# Patient Record
Sex: Male | Born: 1955 | Race: White | Hispanic: No | Marital: Married | Smoking: Former smoker
Health system: Southern US, Community
[De-identification: ages and names within clinical notes are randomized; demographics above are authoritative.]

## PROBLEM LIST (undated history)

## (undated) DIAGNOSIS — Z79620 Long term (current) use of immunosuppressive biologic: Secondary | ICD-10-CM

## (undated) DIAGNOSIS — G4733 Obstructive sleep apnea (adult) (pediatric): Secondary | ICD-10-CM

## (undated) DIAGNOSIS — E119 Type 2 diabetes mellitus without complications: Secondary | ICD-10-CM

## (undated) DIAGNOSIS — N281 Cyst of kidney, acquired: Secondary | ICD-10-CM

## (undated) DIAGNOSIS — F32A Depression, unspecified: Secondary | ICD-10-CM

## (undated) DIAGNOSIS — J45909 Unspecified asthma, uncomplicated: Secondary | ICD-10-CM

## (undated) DIAGNOSIS — M7062 Trochanteric bursitis, left hip: Secondary | ICD-10-CM

## (undated) DIAGNOSIS — I4891 Unspecified atrial fibrillation: Secondary | ICD-10-CM

## (undated) DIAGNOSIS — J449 Chronic obstructive pulmonary disease, unspecified: Secondary | ICD-10-CM

## (undated) DIAGNOSIS — F419 Anxiety disorder, unspecified: Secondary | ICD-10-CM

## (undated) DIAGNOSIS — K760 Fatty (change of) liver, not elsewhere classified: Secondary | ICD-10-CM

## (undated) DIAGNOSIS — I2510 Atherosclerotic heart disease of native coronary artery without angina pectoris: Secondary | ICD-10-CM

## (undated) DIAGNOSIS — I480 Paroxysmal atrial fibrillation: Secondary | ICD-10-CM

## (undated) DIAGNOSIS — I10 Essential (primary) hypertension: Secondary | ICD-10-CM

## (undated) DIAGNOSIS — Z7982 Long term (current) use of aspirin: Secondary | ICD-10-CM

## (undated) DIAGNOSIS — I5189 Other ill-defined heart diseases: Secondary | ICD-10-CM

## (undated) DIAGNOSIS — M810 Age-related osteoporosis without current pathological fracture: Secondary | ICD-10-CM

## (undated) DIAGNOSIS — R42 Dizziness and giddiness: Secondary | ICD-10-CM

## (undated) DIAGNOSIS — M47812 Spondylosis without myelopathy or radiculopathy, cervical region: Secondary | ICD-10-CM

## (undated) DIAGNOSIS — K449 Diaphragmatic hernia without obstruction or gangrene: Secondary | ICD-10-CM

## (undated) DIAGNOSIS — Z7952 Long term (current) use of systemic steroids: Secondary | ICD-10-CM

## (undated) DIAGNOSIS — M76891 Other specified enthesopathies of right lower limb, excluding foot: Secondary | ICD-10-CM

## (undated) DIAGNOSIS — G4730 Sleep apnea, unspecified: Secondary | ICD-10-CM

## (undated) DIAGNOSIS — M3500 Sicca syndrome, unspecified: Secondary | ICD-10-CM

## (undated) DIAGNOSIS — G3184 Mild cognitive impairment, so stated: Secondary | ICD-10-CM

## (undated) DIAGNOSIS — D649 Anemia, unspecified: Secondary | ICD-10-CM

## (undated) DIAGNOSIS — I700 Atherosclerosis of aorta: Secondary | ICD-10-CM

## (undated) DIAGNOSIS — I517 Cardiomegaly: Secondary | ICD-10-CM

## (undated) DIAGNOSIS — K219 Gastro-esophageal reflux disease without esophagitis: Secondary | ICD-10-CM

## (undated) DIAGNOSIS — J439 Emphysema, unspecified: Secondary | ICD-10-CM

## (undated) DIAGNOSIS — M1990 Unspecified osteoarthritis, unspecified site: Secondary | ICD-10-CM

## (undated) DIAGNOSIS — K635 Polyp of colon: Secondary | ICD-10-CM

## (undated) DIAGNOSIS — G96198 Other disorders of meninges, not elsewhere classified: Secondary | ICD-10-CM

## (undated) DIAGNOSIS — I471 Supraventricular tachycardia, unspecified: Secondary | ICD-10-CM

## (undated) HISTORY — PX: ESOPHAGOGASTRODUODENOSCOPY: SHX1529

## (undated) HISTORY — PX: CARDIAC CATHETERIZATION: SHX172

## (undated) HISTORY — PX: HERNIA REPAIR: SHX51

## (undated) HISTORY — PX: KNEE ARTHROSCOPY: SUR90

## (undated) HISTORY — PX: JOINT REPLACEMENT: SHX530

---

## 2015-10-12 DIAGNOSIS — I2510 Atherosclerotic heart disease of native coronary artery without angina pectoris: Secondary | ICD-10-CM

## 2015-10-12 HISTORY — DX: Atherosclerotic heart disease of native coronary artery without angina pectoris: I25.10

## 2015-10-12 HISTORY — PX: LEFT HEART CATH AND CORONARY ANGIOGRAPHY: CATH118249

## 2016-12-03 DIAGNOSIS — Z95818 Presence of other cardiac implants and grafts: Secondary | ICD-10-CM

## 2016-12-03 HISTORY — PX: LOOP RECORDER INSERTION: EP1214

## 2016-12-03 HISTORY — DX: Presence of other cardiac implants and grafts: Z95.818

## 2020-10-16 DIAGNOSIS — R7303 Prediabetes: Secondary | ICD-10-CM | POA: Insufficient documentation

## 2020-10-16 DIAGNOSIS — R42 Dizziness and giddiness: Secondary | ICD-10-CM | POA: Insufficient documentation

## 2020-10-16 DIAGNOSIS — Z86010 Personal history of colonic polyps: Secondary | ICD-10-CM | POA: Insufficient documentation

## 2020-10-16 DIAGNOSIS — F330 Major depressive disorder, recurrent, mild: Secondary | ICD-10-CM | POA: Insufficient documentation

## 2020-10-16 DIAGNOSIS — G4733 Obstructive sleep apnea (adult) (pediatric): Secondary | ICD-10-CM | POA: Insufficient documentation

## 2020-10-16 DIAGNOSIS — J431 Panlobular emphysema: Secondary | ICD-10-CM | POA: Insufficient documentation

## 2020-10-16 DIAGNOSIS — R635 Abnormal weight gain: Secondary | ICD-10-CM | POA: Insufficient documentation

## 2020-10-27 DIAGNOSIS — E785 Hyperlipidemia, unspecified: Secondary | ICD-10-CM

## 2020-10-27 HISTORY — DX: Hyperlipidemia, unspecified: E78.5

## 2021-01-11 DIAGNOSIS — N281 Cyst of kidney, acquired: Secondary | ICD-10-CM | POA: Insufficient documentation

## 2021-02-20 DIAGNOSIS — K760 Fatty (change of) liver, not elsewhere classified: Secondary | ICD-10-CM | POA: Insufficient documentation

## 2021-02-20 DIAGNOSIS — I700 Atherosclerosis of aorta: Secondary | ICD-10-CM | POA: Insufficient documentation

## 2021-03-29 DIAGNOSIS — E785 Hyperlipidemia, unspecified: Secondary | ICD-10-CM | POA: Insufficient documentation

## 2021-04-16 DIAGNOSIS — M47812 Spondylosis without myelopathy or radiculopathy, cervical region: Secondary | ICD-10-CM | POA: Insufficient documentation

## 2021-04-19 DIAGNOSIS — Z87891 Personal history of nicotine dependence: Secondary | ICD-10-CM | POA: Insufficient documentation

## 2021-04-19 DIAGNOSIS — R053 Chronic cough: Secondary | ICD-10-CM | POA: Insufficient documentation

## 2021-08-13 ENCOUNTER — Encounter: Payer: Self-pay | Admitting: Podiatry

## 2021-08-13 ENCOUNTER — Ambulatory Visit: Payer: Medicare HMO | Admitting: Podiatry

## 2021-08-13 ENCOUNTER — Other Ambulatory Visit: Payer: Self-pay

## 2021-08-13 ENCOUNTER — Encounter (INDEPENDENT_AMBULATORY_CARE_PROVIDER_SITE_OTHER): Payer: Self-pay

## 2021-08-13 DIAGNOSIS — L600 Ingrowing nail: Secondary | ICD-10-CM | POA: Diagnosis not present

## 2021-08-13 NOTE — Progress Notes (Signed)
Subjective:  Patient ID: Jesus Mckinney, male    DOB: 07/11/1956,  MRN: [id number]  Chief Complaint  Patient presents with   Nail Problem    Possible ingrown and nail discoloration     65 y.o. male presents with the above complaint.  Patient presents with complaint of right lateral border ingrown.  Patient states painful to touch.  Patient would like to have removed.  He has not seen anyone as prior to see me.  He has done self debridement which has not helped.  No infection or antibiotics he denies any other acute complaints painful with ambulation.  Pain scale is 8 out of 10.   Review of Systems: Negative except as noted in the HPI. Denies N/V/F/Ch.  History reviewed. No pertinent past medical history.  Current Outpatient Medications:    albuterol (ACCUNEB) 1.25 MG/3ML nebulizer solution, Inhale into the lungs., Disp: , Rfl:    fluticasone-salmeterol (ADVAIR HFA) 230-21 MCG/ACT inhaler, Inhale 2 puffs into the lungs 2 (two) times daily., Disp: , Rfl:    methylPREDNISolone (MEDROL DOSEPAK) 4 MG TBPK tablet, See admin instructions., Disp: , Rfl:    aspirin 81 MG EC tablet, Take by mouth., Disp: , Rfl:    atorvastatin (LIPITOR) 40 MG tablet, Take 40 mg by mouth daily., Disp: , Rfl:    escitalopram (LEXAPRO) 20 MG tablet, Take 20 mg by mouth daily., Disp: , Rfl:    gabapentin (NEURONTIN) 300 MG capsule, Take 300 mg by mouth 3 (three) times daily., Disp: , Rfl:    lamoTRIgine (LAMICTAL) 200 MG tablet, Take 200 mg by mouth daily., Disp: , Rfl:    Multiple Vitamin (MULTIVITAMIN) capsule, Take 1 capsule by mouth daily., Disp: , Rfl:    OLANZapine (ZYPREXA) 2.5 MG tablet, Take by mouth., Disp: , Rfl:    omeprazole (PRILOSEC) 40 MG capsule, Take 40 mg by mouth daily., Disp: , Rfl:    valACYclovir (VALTREX) 1000 MG tablet, Take 4,000 mg by mouth once., Disp: , Rfl:    XIIDRA 5 % SOLN, , Disp: , Rfl:   Social History   Tobacco Use  Smoking Status Not on file  Smokeless Tobacco Not on file     Allergies  Allergen Reactions   Dust Mite Extract     Other reaction(s): Cough (ALLERGY/intolerance)   Gramineae Pollens     Other reaction(s): Cough (ALLERGY/intolerance) sneezing   Other     Other reaction(s): Cough (ALLERGY/intolerance) Watery itchy eyes, sneezing.    Objective:  There were no vitals filed for this visit. There is no height or weight on file to calculate BMI. Constitutional Well developed. Well nourished.  Vascular Dorsalis pedis pulses palpable bilaterally. Posterior tibial pulses palpable bilaterally. Capillary refill normal to all digits.  No cyanosis or clubbing noted. Pedal hair growth normal.  Neurologic Normal speech. Oriented to person, place, and time. Epicritic sensation to light touch grossly present bilaterally.  Dermatologic Painful ingrowing nail at lateral nail borders of the hallux nail right. No other open wounds. No skin lesions.  Orthopedic: Normal joint ROM without pain or crepitus bilaterally. No visible deformities. No bony tenderness.   Radiographs: None Assessment:   1. Ingrown toenail of right foot    Plan:  Patient was evaluated and treated and all questions answered.  Ingrown Nail, right -Patient elects to proceed with minor surgery to remove ingrown toenail removal today. Consent reviewed and signed by patient. -Ingrown nail excised. See procedure note. -Educated on post-procedure care including soaking. Written instructions provided and  reviewed. -Patient to follow up in 2 weeks for nail check.  Procedure: Excision of Ingrown Toenail Location: Right 1st toe lateral nail borders. Anesthesia: Lidocaine 1% plain; 1.5 mL and Marcaine 0.5% plain; 1.5 mL, digital block. Skin Prep: Betadine. Dressing: Silvadene; telfa; dry, sterile, compression dressing. Technique: Following skin prep, the toe was exsanguinated and a tourniquet was secured at the base of the toe. The affected nail border was freed, split with a nail  splitter, and excised. Chemical matrixectomy was then performed with phenol and irrigated out with alcohol. The tourniquet was then removed and sterile dressing applied. Disposition: Patient tolerated procedure well. Patient to return in 2 weeks for follow-up.   No follow-ups on file.

## 2021-09-30 ENCOUNTER — Other Ambulatory Visit: Payer: Self-pay

## 2021-09-30 ENCOUNTER — Emergency Department
Admission: EM | Admit: 2021-09-30 | Discharge: 2021-09-30 | Disposition: A | Discharge status: Home / Self Care | Payer: Medicare HMO | Attending: Emergency Medicine | Admitting: Emergency Medicine

## 2021-09-30 ENCOUNTER — Emergency Department: Payer: Medicare HMO

## 2021-09-30 DIAGNOSIS — R000 Tachycardia, unspecified: Secondary | ICD-10-CM | POA: Diagnosis present

## 2021-09-30 DIAGNOSIS — I471 Supraventricular tachycardia: Secondary | ICD-10-CM | POA: Diagnosis not present

## 2021-09-30 DIAGNOSIS — Z7982 Long term (current) use of aspirin: Secondary | ICD-10-CM | POA: Insufficient documentation

## 2021-09-30 DIAGNOSIS — R079 Chest pain, unspecified: Secondary | ICD-10-CM

## 2021-09-30 LAB — CBC
HCT: 45.9 % (ref 39.0–52.0)
Hemoglobin: 15.6 g/dL (ref 13.0–17.0)
MCH: 32.9 pg (ref 26.0–34.0)
MCHC: 34 g/dL (ref 30.0–36.0)
MCV: 96.8 fL (ref 80.0–100.0)
Platelets: 316 10*3/uL (ref 150–400)
RBC: 4.74 MIL/uL (ref 4.22–5.81)
RDW: 13 % (ref 11.5–15.5)
WBC: 12.5 10*3/uL — ABNORMAL HIGH (ref 4.0–10.5)
nRBC: 0 % (ref 0.0–0.2)

## 2021-09-30 LAB — TROPONIN I (HIGH SENSITIVITY)
Troponin I (High Sensitivity): 8 ng/L (ref <18)
Troponin I (High Sensitivity): 8 ng/L (ref <18)

## 2021-09-30 LAB — BASIC METABOLIC PANEL
Anion gap: 8 (ref 5–15)
BUN: 21 mg/dL (ref 8–23)
CO2: 18 mmol/L — ABNORMAL LOW (ref 22–32)
Calcium: 8.6 mg/dL — ABNORMAL LOW (ref 8.9–10.3)
Chloride: 105 mmol/L (ref 98–111)
Creatinine, Ser: 1.33 mg/dL — ABNORMAL HIGH (ref 0.61–1.24)
GFR, Estimated: 59 mL/min — ABNORMAL LOW (ref >60)
Glucose, Bld: 84 mg/dL (ref 70–99)
Potassium: 4.1 mmol/L (ref 3.5–5.1)
Sodium: 131 mmol/L — ABNORMAL LOW (ref 135–145)

## 2021-09-30 LAB — TSH: TSH: 3.191 u[IU]/mL (ref 0.350–4.500)

## 2021-09-30 NOTE — ED Triage Notes (Addendum)
Pt presents from home via POV with complaints of chest heaviness that started about an hour prior to arrival. Pt ambulatory to triage. Pt states he consumed 3 cups of coffee around 4-5pm tonight. Respirations equal and unlabored. Denies any significant cardiac hx.

## 2021-09-30 NOTE — ED Provider Notes (Signed)
[facility] Emergency Department Provider Note  Time seen: 7:30 PM  I have reviewed the triage vital signs and the nursing notes.   HISTORY  Chief Complaint Tachycardia   HPI Jesus Mckinney is a 65 y.o. male with a past medical history of hyperlipidemia, depression, presents to the emergency department for rapid heartbeat.  According to the patient he was blowing leaves today he went inside and laid down on the couch to rest when he began feeling his heart racing.  Patient states this is happened 1 time previously he had a loop recorder for a while but it did not find any issues so they took it out.  Patient states he was feeling short of breath with the palpitations but denied any chest pain.  Patient does admit to 3 cups of coffee/caffeine this afternoon.   No past medical history on file.  Patient Active Problem List   Diagnosis Date Noted   Persistent cough 04/19/2021   Stopped smoking with greater than 30 pack year history 04/19/2021   Arthropathy of cervical facet joint 04/16/2021   Dyslipidemia, goal LDL below 70 03/29/2021   Aortic atherosclerosis (HCC) 02/20/2021   Hepatic steatosis 02/20/2021   Renal cyst 01/11/2021   History of colon polyps 10/16/2020   Mild recurrent major depression (HCC) 10/16/2020   OSA (obstructive sleep apnea) 10/16/2020   Panlobular emphysema (HCC) 10/16/2020   Prediabetes 10/16/2020   Vertigo 10/16/2020   Weight gain 10/16/2020    No past surgical history on file.  Prior to Admission medications   Medication Sig Start Date End Date Taking? Authorizing Provider  albuterol (ACCUNEB) 1.25 MG/3ML nebulizer solution Inhale into the lungs. 04/19/21 04/19/22  [provider]  aspirin 81 MG EC tablet Take by mouth.    [provider]  atorvastatin (LIPITOR) 40 MG tablet Take 40 mg by mouth daily. 06/19/21   [provider]  escitalopram (LEXAPRO) 20 MG tablet Take 20 mg by mouth daily. 06/10/21    [provider]  fluticasone-salmeterol (ADVAIR HFA) 230-21 MCG/ACT inhaler Inhale 2 puffs into the lungs 2 (two) times daily. 08/22/20   [provider]  gabapentin (NEURONTIN) 300 MG capsule Take 300 mg by mouth 3 (three) times daily. 04/15/21   [provider]  lamoTRIgine (LAMICTAL) 200 MG tablet Take 200 mg by mouth daily. 06/04/21   [provider]  methylPREDNISolone (MEDROL DOSEPAK) 4 MG TBPK tablet See admin instructions. 07/29/21   [provider]  Multiple Vitamin (MULTIVITAMIN) capsule Take 1 capsule by mouth daily.    [provider]  OLANZapine (ZYPREXA) 2.5 MG tablet Take by mouth. 06/10/21   [provider]  omeprazole (PRILOSEC) 40 MG capsule Take 40 mg by mouth daily. 06/20/21   [provider]  valACYclovir (VALTREX) 1000 MG tablet Take 4,000 mg by mouth once. 03/18/21   [provider]  Benay Spice 5 % SOLN  03/28/21   [provider]    Allergies  Allergen Reactions   Dust Mite Extract     Other reaction(s): Cough (ALLERGY/intolerance)   Gramineae Pollens     Other reaction(s): Cough (ALLERGY/intolerance) sneezing   Other     Other reaction(s): Cough (ALLERGY/intolerance) Watery itchy eyes, sneezing.     No family history on file.  Social History    Review of Systems Constitutional: Negative for fever. Cardiovascular: Negative for chest pain.  Positive for palpitations and heart racing, now resolved Respiratory: Mild shortness of breath when the heart was racing, now  resolved Gastrointestinal: Negative for abdominal pain, vomiting Musculoskeletal: Negative for musculoskeletal complaints Neurological: Negative for headache All other ROS negative  ____________________________________________   PHYSICAL EXAM:  VITAL SIGNS: ED Triage Vitals  Enc Vitals Group     BP 09/30/21 1904 (!) 140/94     Pulse Rate 09/30/21 1904 (!) 150     Resp 09/30/21 1904 18     Temp 09/30/21 1910  97.8 F (36.6 C)     Temp Source 09/30/21 1910 Oral     SpO2 09/30/21 1904 96 %     Weight 09/30/21 1904 214 lb (97.1 kg)     Height 09/30/21 1904 5\' 7"  (1.702 m)     Head Circumference --      Peak Flow --      Pain Score 09/30/21 1905 0     Pain Loc --      Pain Edu? --      Excl. in GC? --    Constitutional: Alert and oriented. Well appearing and in no distress. Eyes: Normal exam ENT      Head: Normocephalic and atraumatic.      Mouth/Throat: Mucous membranes are moist. Cardiovascular: Normal rate, regular rhythm.  Respiratory: Normal respiratory effort without tachypnea nor retractions. Breath sounds are clear  Gastrointestinal: Soft and nontender. No distention. Musculoskeletal: Nontender with normal range of motion in all extremities.  Neurologic:  Normal speech and language. No gross focal neurologic deficits  Skin:  Skin is warm, dry and intact.  Psychiatric: Mood and affect are normal.   ____________________________________________    EKG  EKG viewed and interpreted by myself shows supraventricular tachycardia at 150 bpm with a narrow QRS, normal axis, normal intervals, nonspecific but no concerning ST changes.  EKG viewed and interpreted by myself shows a sinus rhythm at 76 bpm with a narrow QRS, normal axis, largely normal intervals with no concerning ST changes.  ____________________________________________    RADIOLOGY  Chest x-ray negative  ____________________________________________   INITIAL IMPRESSION / ASSESSMENT AND PLAN / ED COURSE  Pertinent labs & imaging results that were available during my care of the patient were reviewed by me and considered in my medical decision making (see chart for details).   Patient presents to the emergency department for palpitations/heart racing sensation.  Upon arrival patient appeared to be in SVT around 150 bpm.  He was placed into a room urgently, I went to see the patient by the time I saw the patient shortly  after he was in the room he had already spontaneously converted back to a normal sinus rhythm.  Patient currently in a normal sinus rhythm around 75 bpm.  We will check labs including cardiac enzymes electrolytes and a TSH.  We will continue to closely monitor while awaiting results.  Patient agreeable to plan of care.  Lab work is largely reassuring.  TSH is normal.  Troponin normal.  Chemistry normal.  Patient continues to be in a normal sinus rhythm.  We will discharge patient home.  I have discussed with the patient how to do vagal maneuvers.  We will have the patient follow-up with cardiology.  SHIVANSH HARDAWAY was evaluated in Emergency Department on 09/30/2021 for the symptoms described in the history of present illness. He was evaluated in the context of the global COVID-19 pandemic, which necessitated consideration that the patient might be at risk for infection with the SARS-CoV-2 virus that causes COVID-19. Institutional protocols and algorithms that pertain to the evaluation of patients at risk for  COVID-19 are in a state of rapid change based on information released by regulatory bodies including the CDC and federal and state organizations. These policies and algorithms were followed during the patient's care in the ED.  ____________________________________________   FINAL CLINICAL IMPRESSION(S) / ED DIAGNOSES  SVT   Minna Antis, MD 09/30/21 2157

## 2022-02-02 ENCOUNTER — Other Ambulatory Visit: Payer: Self-pay

## 2022-02-02 ENCOUNTER — Emergency Department
Admission: EM | Admit: 2022-02-02 | Discharge: 2022-02-02 | Disposition: A | Discharge status: Home / Self Care | Payer: Medicare HMO | Attending: Emergency Medicine | Admitting: Emergency Medicine

## 2022-02-02 ENCOUNTER — Emergency Department: Payer: Medicare HMO

## 2022-02-02 DIAGNOSIS — J069 Acute upper respiratory infection, unspecified: Secondary | ICD-10-CM | POA: Insufficient documentation

## 2022-02-02 DIAGNOSIS — R059 Cough, unspecified: Secondary | ICD-10-CM | POA: Diagnosis present

## 2022-02-02 DIAGNOSIS — J4521 Mild intermittent asthma with (acute) exacerbation: Secondary | ICD-10-CM | POA: Insufficient documentation

## 2022-02-02 DIAGNOSIS — E119 Type 2 diabetes mellitus without complications: Secondary | ICD-10-CM | POA: Insufficient documentation

## 2022-02-02 DIAGNOSIS — J449 Chronic obstructive pulmonary disease, unspecified: Secondary | ICD-10-CM | POA: Diagnosis not present

## 2022-02-02 DIAGNOSIS — Z87891 Personal history of nicotine dependence: Secondary | ICD-10-CM | POA: Diagnosis not present

## 2022-02-02 HISTORY — DX: Emphysema, unspecified: J43.9

## 2022-02-02 HISTORY — DX: Chronic obstructive pulmonary disease, unspecified: J44.9

## 2022-02-02 MED ORDER — PREDNISONE 10 MG PO TABS: ORAL_TABLET | ORAL | 0 refills | Status: DC

## 2022-02-02 MED ORDER — HYDROCOD POLI-CHLORPHE POLI ER 10-8 MG/5ML PO SUER: 5.0000 mL | Freq: Two times a day (BID) | ORAL | 0 refills | Status: DC | PRN

## 2022-02-02 MED ORDER — IPRATROPIUM-ALBUTEROL 0.5-2.5 (3) MG/3ML IN SOLN
3.0000 mL | Freq: Once | RESPIRATORY_TRACT | Status: AC
  Administered 2022-02-02: 3 mL via RESPIRATORY_TRACT
  Filled 2022-02-02: qty 3

## 2022-02-02 MED ORDER — ALBUTEROL SULFATE 1.25 MG/3ML IN NEBU: 1.0000 | INHALATION_SOLUTION | RESPIRATORY_TRACT | 1 refills | Status: DC | PRN

## 2022-02-02 MED ORDER — DEXAMETHASONE SODIUM PHOSPHATE 10 MG/ML IJ SOLN
10.0000 mg | Freq: Once | INTRAMUSCULAR | Status: AC
  Administered 2022-02-02: 10 mg via INTRAMUSCULAR
  Filled 2022-02-02: qty 1

## 2022-02-02 NOTE — Discharge Instructions (Signed)
Follow-up with your primary care provider if any continued problems or concerns.  The albuterol nebulizer solution was sent to your pharmacy, prednisone as directed and also a prescription for Tussionex every 12 hours as needed for severe coughing.  Be aware that the cough medication does contain a narcotic and can cause drowsiness.  Do not drive or operate machinery while taking this medication.  Increase fluids to stay hydrated.  Return to the emergency department if any severe worsening of your symptoms such as difficulty breathing or shortness of breath. ?

## 2022-02-02 NOTE — ED Provider Notes (Signed)
? ?[facility] ?Provider Note ? ? ? Event Date/Time  ? First MD Initiated Contact with Patient 02/02/22 1102   ?  (approximate) ? ? ?History  ? ?Cough ? ? ?HPI ? ?Jesus Mckinney is a 66 y.o. male   presents to the ED with complaint of cough and congestion for the last 4 days.  He is unaware of any fever or chills.  He states that he does have seasonal allergies and also has a history of asthma.  Patient uses albuterol at home.  Patient has history of COPD and emphysema.  He was a former smoker.  He also has a history of prediabetes, obstructive sleep apnea and renal cysts. ? ?  ? ? ?Physical Exam  ? ?Triage Vital Signs: ?ED Triage Vitals  ?Enc Vitals Group  ?   BP 02/02/22 1035 (!) 154/73  ?   Pulse Rate 02/02/22 1035 69  ?   Resp 02/02/22 1035 18  ?   Temp 02/02/22 1035 98.2 ?F (36.8 ?C)  ?   Temp Source 02/02/22 1035 Oral  ?   SpO2 02/02/22 1035 95 %  ?   Weight 02/02/22 1035 220 lb (99.8 kg)  ?   Height 02/02/22 1035 5\' 5"  (1.651 m)  ?   Head Circumference --   ?   Peak Flow --   ?   Pain Score 02/02/22 1034 6  ?   Pain Loc --   ?   Pain Edu? --   ?   Excl. in GC? --   ? ? ?Most recent vital signs: ?Vitals:  ? 02/02/22 1035 02/02/22 1227  ?BP: (!) 154/73 (!) 148/70  ?Pulse: 69 74  ?Resp: 18 18  ?Temp: 98.2 ?F (36.8 ?C) 98.4 ?F (36.9 ?C)  ?SpO2: 95% 98%  ? ? ? ?General: Awake, no distress.  Appears to be uncomfortable when he has a episode of coughing which is very coarse. ?CV:  Good peripheral perfusion.  Heart regular rate and rhythm ?Resp:  Normal effort.  Lungs without expiratory wheezes, rales or rhonchi. ?Abd:  No distention.  ?Other:   ? ? ?ED Results / Procedures / Treatments  ? ?Labs ?(all labs ordered are listed, but only abnormal results are displayed) ?Labs Reviewed - No data to display ? ? ? ?RADIOLOGY ?Chest x-ray reviewed by myself independent from the radiology report is negative for infiltrate.  Radiology report is negative for any acute cardiopulmonary  disease. ? ? ? ?PROCEDURES: ? ?Critical Care performed:  ? ?Procedures ? ? ?MEDICATIONS ORDERED IN ED: ?Medications  ?dexamethasone (DECADRON) injection 10 mg (10 mg Intramuscular Given 02/02/22 1129)  ?ipratropium-albuterol (DUONEB) 0.5-2.5 (3) MG/3ML nebulizer solution 3 mL (3 mLs Nebulization Given 02/02/22 1132)  ? ? ? ?IMPRESSION / MDM / ASSESSMENT AND PLAN / ED COURSE  ?I reviewed the triage vital signs and the nursing notes. ? ? ?Differential diagnosis includes, but is not limited to, bronchitis, COPD, asthma, pneumonia. ? ? ?66 year old male presents to the ED with complaint of cough and congestion for the last 4 days.  He is unaware of any fever and denies chills.  He states that he has seasonal allergies and also has a history of asthma.  He states that generally he has an exacerbation that requires steroids and albuterol.  Chest x-ray in the ED was reassuring and patient was made aware that he did not have pneumonia.  He reported that the slow taper for the prednisone has worked well for him in  the past.  A refill of his albuterol nebulizer solution was sent to the pharmacy along with a prescription for Tussionex to take twice daily as needed for cough.  Patient was made aware that this medication contains a narcotic and because possible drowsiness which may increase his risk for falling.  Patient will follow-up with his PCP if any continued problems. ? ? ? ?  ? ? ?FINAL CLINICAL IMPRESSION(S) / ED DIAGNOSES  ? ?Final diagnoses:  ?Mild intermittent asthma with exacerbation  ?Viral URI with cough  ? ? ? ?Rx / DC Orders  ? ?ED Discharge Orders   ? ?      Ordered  ?  predniSONE (DELTASONE) 10 MG tablet       ? 02/02/22 1219  ?  albuterol (ACCUNEB) 1.25 MG/3ML nebulizer solution  Every 4 hours PRN       ? 02/02/22 1219  ?  chlorpheniramine-HYDROcodone (TUSSIONEX PENNKINETIC ER) 10-8 MG/5ML  Every 12 hours PRN       ? 02/02/22 1219  ? ?  ?  ? ?  ? ? ? ?Note:  This document was prepared using Dragon voice  recognition software and may include unintentional dictation errors. ?  ?Tommi Rumps, PA-C ?02/02/22 1352 ? ?  ?Jene Every, MD ?02/02/22 1408 ? ?

## 2022-02-02 NOTE — ED Triage Notes (Signed)
Pt c/o barking cough with congestion for the past 4 days. Pt is in NAD on arrival ?

## 2022-02-03 ENCOUNTER — Other Ambulatory Visit: Payer: Self-pay

## 2022-02-03 ENCOUNTER — Emergency Department: Payer: Medicare HMO

## 2022-02-03 ENCOUNTER — Emergency Department
Admission: EM | Admit: 2022-02-03 | Discharge: 2022-02-03 | Disposition: A | Discharge status: Home / Self Care | Payer: Medicare HMO | Attending: Emergency Medicine | Admitting: Emergency Medicine

## 2022-02-03 DIAGNOSIS — J45909 Unspecified asthma, uncomplicated: Secondary | ICD-10-CM | POA: Diagnosis not present

## 2022-02-03 DIAGNOSIS — R0602 Shortness of breath: Secondary | ICD-10-CM | POA: Diagnosis present

## 2022-02-03 DIAGNOSIS — J449 Chronic obstructive pulmonary disease, unspecified: Secondary | ICD-10-CM | POA: Diagnosis not present

## 2022-02-03 LAB — COMPREHENSIVE METABOLIC PANEL
ALT: 47 U/L — ABNORMAL HIGH (ref 0–44)
AST: 50 U/L — ABNORMAL HIGH (ref 15–41)
Albumin: 4 g/dL (ref 3.5–5.0)
Alkaline Phosphatase: 85 U/L (ref 38–126)
Anion gap: 12 (ref 5–15)
BUN: 19 mg/dL (ref 8–23)
CO2: 17 mmol/L — ABNORMAL LOW (ref 22–32)
Calcium: 8.5 mg/dL — ABNORMAL LOW (ref 8.9–10.3)
Chloride: 97 mmol/L — ABNORMAL LOW (ref 98–111)
Creatinine, Ser: 1.25 mg/dL — ABNORMAL HIGH (ref 0.61–1.24)
GFR, Estimated: >60 mL/min (ref >60)
Glucose, Bld: 139 mg/dL — ABNORMAL HIGH (ref 70–99)
Potassium: 4.8 mmol/L (ref 3.5–5.1)
Sodium: 126 mmol/L — ABNORMAL LOW (ref 135–145)
Total Bilirubin: 0.9 mg/dL (ref 0.3–1.2)
Total Protein: 7.5 g/dL (ref 6.5–8.1)

## 2022-02-03 LAB — CBC
HCT: 38.3 % — ABNORMAL LOW (ref 39.0–52.0)
Hemoglobin: 12.8 g/dL — ABNORMAL LOW (ref 13.0–17.0)
MCH: 32.6 pg (ref 26.0–34.0)
MCHC: 33.4 g/dL (ref 30.0–36.0)
MCV: 97.5 fL (ref 80.0–100.0)
Platelets: 263 10*3/uL (ref 150–400)
RBC: 3.93 MIL/uL — ABNORMAL LOW (ref 4.22–5.81)
RDW: 12.8 % (ref 11.5–15.5)
WBC: 12.7 10*3/uL — ABNORMAL HIGH (ref 4.0–10.5)
nRBC: 0 % (ref 0.0–0.2)

## 2022-02-03 LAB — TROPONIN I (HIGH SENSITIVITY): Troponin I (High Sensitivity): 14 ng/L (ref <18)

## 2022-02-03 MED ORDER — DOXYCYCLINE HYCLATE 100 MG PO CAPS: 100.0000 mg | ORAL_CAPSULE | Freq: Two times a day (BID) | ORAL | 0 refills | Status: DC

## 2022-02-03 MED ORDER — GUAIFENESIN ER 600 MG PO TB12: 600.0000 mg | ORAL_TABLET | Freq: Two times a day (BID) | ORAL | 0 refills | Status: AC

## 2022-02-03 MED ORDER — BENZONATATE 100 MG PO CAPS
200.0000 mg | ORAL_CAPSULE | Freq: Once | ORAL | Status: AC
  Administered 2022-02-03: 200 mg via ORAL
  Filled 2022-02-03: qty 2

## 2022-02-03 MED ORDER — BENZONATATE 100 MG PO CAPS: 100.0000 mg | ORAL_CAPSULE | Freq: Three times a day (TID) | ORAL | 0 refills | Status: DC | PRN

## 2022-02-03 MED ORDER — GUAIFENESIN 100 MG/5ML PO LIQD
10.0000 mL | Freq: Once | ORAL | Status: AC
  Administered 2022-02-03: 10 mL via ORAL
  Filled 2022-02-03: qty 10

## 2022-02-03 MED ORDER — IPRATROPIUM-ALBUTEROL 0.5-2.5 (3) MG/3ML IN SOLN
3.0000 mL | Freq: Once | RESPIRATORY_TRACT | Status: AC
  Administered 2022-02-03: 3 mL via RESPIRATORY_TRACT
  Filled 2022-02-03: qty 3

## 2022-02-03 NOTE — ED Provider Notes (Signed)
? ?[facility] ?Provider Note ? ? ? Event Date/Time  ? First MD Initiated Contact with Patient 02/03/22 2145   ?  (approximate) ? ? ?History  ? ?Asthma ? ? ?HPI ? ?Jesus Mckinney is a 66 y.o. male with a past history of emphysema, obesity, mental health disorder who comes to the ED today due to a asthma/COPD attack.  Reports he was getting very short of breath and wheezy, called EMS who noted that he had diffuse wheezing.  They gave IV Solu-Medrol and 2 duo nebs and symptoms have all but resolved on arrival to the ED.  He has nonproductive cough, no fever.  Denies chest pain.  States that his shortness of breath feels resolved and is back to normal, but he is worried about being at home. ? ?He believes that the symptoms are worsened because he recently moved to [place] from [place] of state and has not used to the large burden of pine pollen. ?  ? ? ?Physical Exam  ? ?Triage Vital Signs: ?ED Triage Vitals  ?Enc Vitals Group  ?   BP 02/03/22 2202 (!) 143/53  ?   Pulse Rate 02/03/22 2202 84  ?   Resp 02/03/22 2202 16  ?   Temp 02/03/22 2202 98.1 ?F (36.7 ?C)  ?   Temp Source 02/03/22 2202 Oral  ?   SpO2 02/03/22 2202 91 %  ?   Weight 02/03/22 2201 220 lb (99.8 kg)  ?   Height --   ?   Head Circumference --   ?   Peak Flow --   ?   Pain Score --   ?   Pain Loc --   ?   Pain Edu? --   ?   Excl. in GC? --   ? ? ?Most recent vital signs: ?Vitals:  ? 02/03/22 2202 02/03/22 2230  ?BP: (!) 143/53 (!) 136/54  ?Pulse: 84 86  ?Resp: 16 17  ?Temp: 98.1 ?F (36.7 ?C)   ?SpO2: 91% 94%  ? ? ? ?General: Awake, no distress.  ?CV:  Good peripheral perfusion.  Regular rate and rhythm ?Resp:  Normal effort.  Clear to auscultation, no wheezing ?Abd:  No distention.  Soft nontender ?Other:  No lower extremity edema or calf tenderness. ? ? ?ED Results / Procedures / Treatments  ? ?Labs ?(all labs ordered are listed, but only abnormal results are displayed) ?Labs Reviewed  ?CBC - Abnormal; Notable for the following  components:  ?    Result Value  ? WBC 12.7 (*)   ? RBC 3.93 (*)   ? Hemoglobin 12.8 (*)   ? HCT 38.3 (*)   ? All other components within normal limits  ?COMPREHENSIVE METABOLIC PANEL - Abnormal; Notable for the following components:  ? Sodium 126 (*)   ? Chloride 97 (*)   ? CO2 17 (*)   ? Glucose, Bld 139 (*)   ? Creatinine, Ser 1.25 (*)   ? Calcium 8.5 (*)   ? AST 50 (*)   ? ALT 47 (*)   ? All other components within normal limits  ?TROPONIN I (HIGH SENSITIVITY)  ? ? ? ?EKG ? ?Interpreted by me ?Sinus rhythm rate of 84.  Normal axis intervals QRS ST segments and T waves. ? ? ?RADIOLOGY ?Chest x-ray viewed and interpreted by me, appears normal.  Radiology report reviewed ? ? ? ?PROCEDURES: ? ?Critical Care performed: No ? ?Procedures ? ? ?MEDICATIONS ORDERED IN ED: ?Medications  ?guaiFENesin (  ROBITUSSIN) 100 MG/5ML liquid 10 mL (has no administration in time range)  ?benzonatate (TESSALON) capsule 200 mg (has no administration in time range)  ?ipratropium-albuterol (DUONEB) 0.5-2.5 (3) MG/3ML nebulizer solution 3 mL (3 mLs Nebulization Given 02/03/22 2223)  ? ? ? ?IMPRESSION / MDM / ASSESSMENT AND PLAN / ED COURSE  ?I reviewed the triage vital signs and the nursing notes. ?             ?               ? ?Differential diagnosis includes, but is not limited to, pneumonia, pneumothorax, COPD exacerbation ? ?Patient presents with episode of wheezing which is resolved on arrival to the ED with bronchodilators.  He is nontoxic, exam is reassuring, vital signs are normal.  He does appear anxious which I think is exacerbating his perception of his symptoms. ? Considering the patient's symptoms, medical history, and physical examination today, I have low suspicion for ACS, PE, TAD, pneumothorax, carditis, mediastinitis, pneumonia, CHF, or sepsis. ? ?Labs and chest x-ray in the ED are again unremarkable.  Symptoms are resolved and he is back to baseline.  I will add Tessalon, guaifenesin, doxycycline.  He does report that he  already has scheduled follow-up with pulmonology for PFTs and further evaluation. ? ? ? ?  ? ? ?FINAL CLINICAL IMPRESSION(S) / ED DIAGNOSES  ? ?Final diagnoses:  ?Chronic obstructive pulmonary disease, unspecified COPD type (HCC)  ? ? ? ?Rx / DC Orders  ? ?ED Discharge Orders   ? ?      Ordered  ?  guaiFENesin (MUCINEX) 600 MG 12 hr tablet  2 times daily       ? 02/03/22 2251  ?  benzonatate (TESSALON PERLES) 100 MG capsule  3 times daily PRN       ? 02/03/22 2251  ?  doxycycline (VIBRAMYCIN) 100 MG capsule  2 times daily       ? 02/03/22 2251  ? ?  ?  ? ?  ? ? ? ?Note:  This document was prepared using Dragon voice recognition software and may include unintentional dictation errors. ?  ?Sharman Cheek, MD ?02/03/22 2256 ? ?

## 2022-02-03 NOTE — ED Triage Notes (Signed)
Pt arrived via EMS for a Asthma attack. PT sts that the medication that we gave him yesterday and helped only a little. ?

## 2022-02-05 ENCOUNTER — Emergency Department: Payer: Medicare HMO

## 2022-02-05 ENCOUNTER — Observation Stay (HOSPITAL_COMMUNITY)
Admission: EM | Admit: 2022-02-05 | Discharge: 2022-02-06 | Disposition: A | Discharge status: Home / Self Care | Payer: Medicare HMO | Source: Home / Self Care | Attending: Emergency Medicine | Admitting: Emergency Medicine

## 2022-02-05 ENCOUNTER — Inpatient Hospital Stay: Payer: Medicare HMO

## 2022-02-05 ENCOUNTER — Inpatient Hospital Stay (HOSPITAL_COMMUNITY)
Admit: 2022-02-05 | Discharge: 2022-02-05 | Disposition: A | Discharge status: Home / Self Care | Payer: Medicare HMO | Attending: Pulmonary Disease | Admitting: Pulmonary Disease

## 2022-02-05 ENCOUNTER — Encounter: Payer: Self-pay | Admitting: Emergency Medicine

## 2022-02-05 ENCOUNTER — Other Ambulatory Visit: Payer: Self-pay

## 2022-02-05 DIAGNOSIS — Z79899 Other long term (current) drug therapy: Secondary | ICD-10-CM | POA: Insufficient documentation

## 2022-02-05 DIAGNOSIS — J690 Pneumonitis due to inhalation of food and vomit: Principal | ICD-10-CM | POA: Insufficient documentation

## 2022-02-05 DIAGNOSIS — I700 Atherosclerosis of aorta: Secondary | ICD-10-CM | POA: Diagnosis present

## 2022-02-05 DIAGNOSIS — E669 Obesity, unspecified: Secondary | ICD-10-CM | POA: Diagnosis present

## 2022-02-05 DIAGNOSIS — Z87891 Personal history of nicotine dependence: Secondary | ICD-10-CM | POA: Insufficient documentation

## 2022-02-05 DIAGNOSIS — J9600 Acute respiratory failure, unspecified whether with hypoxia or hypercapnia: Secondary | ICD-10-CM | POA: Diagnosis not present

## 2022-02-05 DIAGNOSIS — F330 Major depressive disorder, recurrent, mild: Secondary | ICD-10-CM | POA: Diagnosis present

## 2022-02-05 DIAGNOSIS — Z9660 Presence of unspecified orthopedic joint implant: Secondary | ICD-10-CM | POA: Insufficient documentation

## 2022-02-05 DIAGNOSIS — R7303 Prediabetes: Secondary | ICD-10-CM | POA: Insufficient documentation

## 2022-02-05 DIAGNOSIS — E871 Hypo-osmolality and hyponatremia: Secondary | ICD-10-CM | POA: Diagnosis present

## 2022-02-05 DIAGNOSIS — Z20822 Contact with and (suspected) exposure to covid-19: Secondary | ICD-10-CM | POA: Insufficient documentation

## 2022-02-05 DIAGNOSIS — Z7982 Long term (current) use of aspirin: Secondary | ICD-10-CM | POA: Insufficient documentation

## 2022-02-05 DIAGNOSIS — R0602 Shortness of breath: Secondary | ICD-10-CM

## 2022-02-05 DIAGNOSIS — J441 Chronic obstructive pulmonary disease with (acute) exacerbation: Secondary | ICD-10-CM | POA: Insufficient documentation

## 2022-02-05 DIAGNOSIS — I5031 Acute diastolic (congestive) heart failure: Secondary | ICD-10-CM | POA: Diagnosis not present

## 2022-02-05 DIAGNOSIS — J189 Pneumonia, unspecified organism: Secondary | ICD-10-CM | POA: Diagnosis not present

## 2022-02-05 DIAGNOSIS — R0781 Pleurodynia: Secondary | ICD-10-CM

## 2022-02-05 DIAGNOSIS — J204 Acute bronchitis due to parainfluenza virus: Secondary | ICD-10-CM

## 2022-02-05 LAB — CBC
HCT: 40.8 % (ref 39.0–52.0)
Hemoglobin: 13.7 g/dL (ref 13.0–17.0)
MCH: 33 pg (ref 26.0–34.0)
MCHC: 33.6 g/dL (ref 30.0–36.0)
MCV: 98.3 fL (ref 80.0–100.0)
Platelets: 282 10*3/uL (ref 150–400)
RBC: 4.15 MIL/uL — ABNORMAL LOW (ref 4.22–5.81)
RDW: 12.8 % (ref 11.5–15.5)
WBC: 13.9 10*3/uL — ABNORMAL HIGH (ref 4.0–10.5)
nRBC: 0 % (ref 0.0–0.2)

## 2022-02-05 LAB — RESPIRATORY PANEL BY PCR

## 2022-02-05 LAB — ECHOCARDIOGRAM COMPLETE
AR max vel: 3.39 cm2
AV Area VTI: 3.02 cm2
AV Area mean vel: 2.9 cm2
AV Mean grad: 5 mmHg
AV Peak grad: 8.4 mmHg
Ao pk vel: 1.45 m/s
Area-P 1/2: 4.26 cm2
Calc EF: 69.4 %
Height: 65 in
MV VTI: 2.63 cm2
S' Lateral: 2.95 cm
Single Plane A2C EF: 68.6 %
Single Plane A4C EF: 74.3 %
Weight: 3520 oz

## 2022-02-05 LAB — BASIC METABOLIC PANEL
Anion gap: 10 (ref 5–15)
BUN: 23 mg/dL (ref 8–23)
CO2: 20 mmol/L — ABNORMAL LOW (ref 22–32)
Calcium: 8.5 mg/dL — ABNORMAL LOW (ref 8.9–10.3)
Chloride: 95 mmol/L — ABNORMAL LOW (ref 98–111)
Creatinine, Ser: 1.23 mg/dL (ref 0.61–1.24)
GFR, Estimated: >60 mL/min (ref >60)
Glucose, Bld: 96 mg/dL (ref 70–99)
Potassium: 3.7 mmol/L (ref 3.5–5.1)
Sodium: 125 mmol/L — ABNORMAL LOW (ref 135–145)

## 2022-02-05 LAB — TROPONIN I (HIGH SENSITIVITY)
Troponin I (High Sensitivity): 12 ng/L (ref <18)
Troponin I (High Sensitivity): 13 ng/L (ref <18)

## 2022-02-05 LAB — RESP PANEL BY RT-PCR (FLU A&B, COVID) ARPGX2
Influenza A by PCR: NEGATIVE
Influenza B by PCR: NEGATIVE
SARS Coronavirus 2 by RT PCR: NEGATIVE

## 2022-02-05 LAB — LACTIC ACID, PLASMA
Lactic Acid, Venous: 3.5 mmol/L (ref 0.5–1.9)
Lactic Acid, Venous: 2.5 mmol/L (ref 0.5–1.9)

## 2022-02-05 LAB — PROCALCITONIN: Procalcitonin: <0.1 ng/mL

## 2022-02-05 LAB — BRAIN NATRIURETIC PEPTIDE: B Natriuretic Peptide: 68.1 pg/mL (ref 0.0–100.0)

## 2022-02-05 MED ORDER — OXYCODONE-ACETAMINOPHEN 5-325 MG PO TABS
1.0000 | ORAL_TABLET | Freq: Four times a day (QID) | ORAL | Status: DC | PRN
  Administered 2022-02-05 – 2022-02-06 (×3): 1 via ORAL
  Filled 2022-02-05 (×3): qty 1

## 2022-02-05 MED ORDER — KETOROLAC TROMETHAMINE 30 MG/ML IJ SOLN
10.0000 mg | Freq: Once | INTRAMUSCULAR | Status: AC
  Administered 2022-02-05: 9.9 mg via INTRAVENOUS
  Filled 2022-02-05: qty 1

## 2022-02-05 MED ORDER — IPRATROPIUM-ALBUTEROL 0.5-2.5 (3) MG/3ML IN SOLN
3.0000 mL | Freq: Once | RESPIRATORY_TRACT | Status: AC
  Administered 2022-02-05: 3 mL via RESPIRATORY_TRACT
  Filled 2022-02-05: qty 3

## 2022-02-05 MED ORDER — MOMETASONE FURO-FORMOTEROL FUM 200-5 MCG/ACT IN AERO
2.0000 | INHALATION_SPRAY | Freq: Two times a day (BID) | RESPIRATORY_TRACT | Status: DC
  Administered 2022-02-06: 2 via RESPIRATORY_TRACT

## 2022-02-05 MED ORDER — LAMOTRIGINE 25 MG PO TABS
200.0000 mg | ORAL_TABLET | Freq: Every day | ORAL | Status: DC
  Administered 2022-02-05 – 2022-02-06 (×2): 200 mg via ORAL
  Filled 2022-02-05: qty 8
  Filled 2022-02-05: qty 2

## 2022-02-05 MED ORDER — KETOROLAC TROMETHAMINE 15 MG/ML IJ SOLN
15.0000 mg | Freq: Once | INTRAMUSCULAR | Status: AC
Start: 2022-02-05 — End: 2022-02-06
  Administered 2022-02-06: 15 mg via INTRAVENOUS
  Filled 2022-02-05: qty 1

## 2022-02-05 MED ORDER — PREDNISONE 20 MG PO TABS: 40.0000 mg | ORAL_TABLET | Freq: Every day | ORAL | Status: DC

## 2022-02-05 MED ORDER — ALBUTEROL SULFATE (2.5 MG/3ML) 0.083% IN NEBU
2.5000 mg | INHALATION_SOLUTION | RESPIRATORY_TRACT | Status: DC | PRN
Start: 2022-02-05 — End: 2022-02-06

## 2022-02-05 MED ORDER — PERFLUTREN LIPID MICROSPHERE
1.0000 mL | INTRAVENOUS | Status: AC | PRN
  Administered 2022-02-05: 3 mL via INTRAVENOUS
  Filled 2022-02-05: qty 10

## 2022-02-05 MED ORDER — TRAMADOL HCL 50 MG PO TABS
100.0000 mg | ORAL_TABLET | Freq: Once | ORAL | Status: AC
  Administered 2022-02-05: 100 mg via ORAL
  Filled 2022-02-05: qty 2

## 2022-02-05 MED ORDER — SODIUM CHLORIDE 0.9% FLUSH: 3.0000 mL | INTRAVENOUS | Status: DC | PRN

## 2022-02-05 MED ORDER — GUAIFENESIN ER 600 MG PO TB12
1200.0000 mg | ORAL_TABLET | Freq: Two times a day (BID) | ORAL | Status: DC
Start: 2022-02-05 — End: 2022-02-06
  Administered 2022-02-05 – 2022-02-06 (×3): 1200 mg via ORAL
  Filled 2022-02-05 (×3): qty 2

## 2022-02-05 MED ORDER — HYDROCOD POLI-CHLORPHE POLI ER 10-8 MG/5ML PO SUER
5.0000 mL | Freq: Once | ORAL | Status: AC
  Administered 2022-02-05: 5 mL via ORAL
  Filled 2022-02-05: qty 5

## 2022-02-05 MED ORDER — PANTOPRAZOLE SODIUM 40 MG PO TBEC
40.0000 mg | DELAYED_RELEASE_TABLET | Freq: Every day | ORAL | Status: DC
Start: 2022-02-05 — End: 2022-02-06
  Administered 2022-02-05 – 2022-02-06 (×2): 40 mg via ORAL
  Filled 2022-02-05 (×2): qty 1

## 2022-02-05 MED ORDER — SODIUM CHLORIDE 0.9 % IV SOLN
3.0000 g | Freq: Three times a day (TID) | INTRAVENOUS | Status: DC
  Administered 2022-02-05 – 2022-02-06 (×3): 3 g via INTRAVENOUS
  Filled 2022-02-05 (×4): qty 8

## 2022-02-05 MED ORDER — ATENOLOL 25 MG PO TABS
25.0000 mg | ORAL_TABLET | Freq: Every day | ORAL | Status: DC
  Administered 2022-02-05 – 2022-02-06 (×2): 25 mg via ORAL
  Filled 2022-02-05 (×2): qty 1

## 2022-02-05 MED ORDER — ONDANSETRON HCL 4 MG PO TABS: 4.0000 mg | ORAL_TABLET | Freq: Four times a day (QID) | ORAL | Status: DC | PRN

## 2022-02-05 MED ORDER — ENOXAPARIN SODIUM 60 MG/0.6ML IJ SOSY
0.5000 mg/kg | PREFILLED_SYRINGE | INTRAMUSCULAR | Status: DC
  Administered 2022-02-05: 50 mg via SUBCUTANEOUS
  Filled 2022-02-05: qty 0.6

## 2022-02-05 MED ORDER — ACETAMINOPHEN 325 MG PO TABS: 650.0000 mg | ORAL_TABLET | Freq: Four times a day (QID) | ORAL | Status: DC | PRN

## 2022-02-05 MED ORDER — ESCITALOPRAM OXALATE 10 MG PO TABS
20.0000 mg | ORAL_TABLET | Freq: Every day | ORAL | Status: DC
  Administered 2022-02-05 – 2022-02-06 (×2): 20 mg via ORAL
  Filled 2022-02-05 (×2): qty 2

## 2022-02-05 MED ORDER — ASPIRIN EC 81 MG PO TBEC
81.0000 mg | DELAYED_RELEASE_TABLET | Freq: Every day | ORAL | Status: DC
  Administered 2022-02-05 – 2022-02-06 (×2): 81 mg via ORAL
  Filled 2022-02-05 (×2): qty 1

## 2022-02-05 MED ORDER — SODIUM CHLORIDE 3 % IN NEBU
4.0000 mL | INHALATION_SOLUTION | Freq: Every day | RESPIRATORY_TRACT | Status: DC
  Administered 2022-02-05 – 2022-02-06 (×2): 4 mL via RESPIRATORY_TRACT
  Filled 2022-02-05 (×2): qty 4

## 2022-02-05 MED ORDER — DONEPEZIL HCL 5 MG PO TABS
5.0000 mg | ORAL_TABLET | Freq: Every evening | ORAL | Status: DC
  Administered 2022-02-05: 5 mg via ORAL
  Filled 2022-02-05: qty 1

## 2022-02-05 MED ORDER — SODIUM CHLORIDE 0.9% FLUSH
3.0000 mL | Freq: Two times a day (BID) | INTRAVENOUS | Status: DC
  Administered 2022-02-05: 3 mL via INTRAVENOUS

## 2022-02-05 MED ORDER — ATORVASTATIN CALCIUM 20 MG PO TABS
40.0000 mg | ORAL_TABLET | Freq: Every evening | ORAL | Status: DC
  Administered 2022-02-05: 40 mg via ORAL
  Filled 2022-02-05: qty 2

## 2022-02-05 MED ORDER — IOHEXOL 350 MG/ML SOLN
75.0000 mL | Freq: Once | INTRAVENOUS | Status: AC | PRN
  Administered 2022-02-05: 75 mL via INTRAVENOUS

## 2022-02-05 MED ORDER — SODIUM CHLORIDE 0.9 % IV SOLN: 250.0000 mL | INTRAVENOUS | Status: DC | PRN

## 2022-02-05 MED ORDER — METHYLPREDNISOLONE SODIUM SUCC 40 MG IJ SOLR
40.0000 mg | Freq: Two times a day (BID) | INTRAMUSCULAR | Status: AC
  Administered 2022-02-05 – 2022-02-06 (×2): 40 mg via INTRAVENOUS
  Filled 2022-02-05 (×2): qty 1

## 2022-02-05 MED ORDER — METHYLPREDNISOLONE SODIUM SUCC 125 MG IJ SOLR
125.0000 mg | Freq: Once | INTRAMUSCULAR | Status: AC
  Administered 2022-02-05: 125 mg via INTRAVENOUS
  Filled 2022-02-05: qty 2

## 2022-02-05 MED ORDER — IPRATROPIUM-ALBUTEROL 0.5-2.5 (3) MG/3ML IN SOLN
3.0000 mL | Freq: Four times a day (QID) | RESPIRATORY_TRACT | Status: DC
  Administered 2022-02-05 – 2022-02-06 (×6): 3 mL via RESPIRATORY_TRACT
  Filled 2022-02-05 (×5): qty 3

## 2022-02-05 MED ORDER — SODIUM CHLORIDE 0.9 % IV BOLUS
500.0000 mL | Freq: Once | INTRAVENOUS | Status: AC
  Administered 2022-02-05: 500 mL via INTRAVENOUS

## 2022-02-05 MED ORDER — EZETIMIBE 10 MG PO TABS
10.0000 mg | ORAL_TABLET | Freq: Every day | ORAL | Status: DC
  Administered 2022-02-05 – 2022-02-06 (×2): 10 mg via ORAL
  Filled 2022-02-05 (×2): qty 1

## 2022-02-05 MED ORDER — SODIUM CHLORIDE 0.9 % IV SOLN
INTRAVENOUS | Status: AC
Start: 2022-02-05 — End: 2022-02-05

## 2022-02-05 MED ORDER — OLANZAPINE 2.5 MG PO TABS
1.2500 mg | ORAL_TABLET | Freq: Every day | ORAL | Status: DC
  Administered 2022-02-05 – 2022-02-06 (×2): 1.25 mg via ORAL
  Filled 2022-02-05 (×2): qty 0.5

## 2022-02-05 MED ORDER — ACETAMINOPHEN 650 MG RE SUPP: 650.0000 mg | Freq: Four times a day (QID) | RECTAL | Status: DC | PRN

## 2022-02-05 MED ORDER — SODIUM CHLORIDE 0.9 % IV SOLN
3.0000 g | Freq: Once | INTRAVENOUS | Status: AC
  Administered 2022-02-05: 3 g via INTRAVENOUS
  Filled 2022-02-05: qty 8

## 2022-02-05 MED ORDER — ONDANSETRON HCL 4 MG/2ML IJ SOLN
4.0000 mg | Freq: Four times a day (QID) | INTRAMUSCULAR | Status: DC | PRN
Start: 2022-02-05 — End: 2022-02-06

## 2022-02-05 NOTE — ED Triage Notes (Signed)
Pt to ED from home c/o cough and SOB since Saturday.  States hx COPD.  States coughing so hard right ribs hurt and feels broken.  Also states productive cough but more blood than mucous.  Using nebulizer at home and helps breathing but states still can't bring up mucous.  Pt took cough medicine with codeine around midnight.  Pt A&Ox4, chest rise even and unlabored, skin WNL. ?

## 2022-02-05 NOTE — Consult Note (Signed)
? ? ? ?PULMONOLOGY ? ? ? ? ? ? ? ? ?Date: 02/05/2022,   ?MRN# [id number] Jesus MaskerWilliam B Mckinney 11/09/1955 ? ? ?  ?AdmissionWeight: 99.8 kg                 ?CurrentWeight: 99.8 kg ? ?Referring provider: Dr Joylene IgoAgbata ? ? ?CHIEF COMPLAINT:  ? ?Severe Acute exacerbation of COPD ? ? ?HISTORY OF PRESENT ILLNESS  ? ?This is a patient with COPD OSA overlap syndrome, CAD, morbid obesity metabolic syndrome with hepatic steatosis.  He had asthma for many years and uses nebulizer at baseline. He stopped smoking 12 years ago. He notes cough and dyspnea on minimal exertion over past 6 months getting worse. He uses spiriva once daily and advair bid but was not improving and required several rounds of nebulizer.  He has recently moved from [place] and has not had pulmonary provider since moving. He reports chest pain worse on right and has been on prednisone 40mg  for past 48h. His CTPE was reviewed by me independently with findings of RLL intestitial edema and bilateral bronchitic changes.  ? ? ?PAST MEDICAL HISTORY  ? ?Past Medical History:  ?Diagnosis Date  ? COPD (chronic obstructive pulmonary disease) (HCC)   ? Emphysema lung (HCC)   ? ? ? ?SURGICAL HISTORY  ? ?Past Surgical History:  ?Procedure Laterality Date  ? JOINT REPLACEMENT    ? ? ? ?FAMILY HISTORY  ? ?History reviewed. No pertinent family history. ? ? ?SOCIAL HISTORY  ? ?Social History  ? ?Tobacco Use  ? Smoking status: Former  ?  Types: Cigarettes  ? Smokeless tobacco: Never  ?Substance Use Topics  ? Alcohol use: Not Currently  ? Drug use: Not Currently  ? ? ? ?MEDICATIONS  ? ? ?Home Medication:  ?Current Outpatient Rx  ? Order #: [id number] Class: Normal  ? Order #: [id number] Class: Historical Med  ? Order #: [id number] Class: Historical Med  ? Order #: [id number] Class: Historical Med  ? Order #: [id number] Class: Historical Med  ? Order #: [id number] Class: Print  ? Order #: [id number] Class: Normal  ? Order #: [id number] Class: Historical Med  ? Order #: [id number] Class: Print  ? Order #:  [id number] Class: Historical Med  ? Order #: [id number] Class: Historical Med  ? Order #: [id number] Class: Historical Med  ? Order #: [id number] Class: Print  ? Order #: [id number] Class: Historical Med  ? Order #: [id number] Class: Historical Med  ? Order #: [id number] Class: Historical Med  ? Order #: [id number] Class: Normal  ?  ?Current Medication: ? ?Current Facility-Administered Medications:  ?  0.9 %  sodium chloride infusion, 250 mL, Intravenous, PRN, Agbata, Tochukwu, MD ?  0.9 %  sodium chloride infusion, , Intravenous, Continuous, Agbata, Tochukwu, MD ?  acetaminophen (TYLENOL) tablet 650 mg, 650 mg, Oral, Q6H PRN **OR** acetaminophen (TYLENOL) suppository 650 mg, 650 mg, Rectal, Q6H PRN, Agbata, Tochukwu, MD ?  albuterol (PROVENTIL) (2.5 MG/3ML) 0.083% nebulizer solution 2.5 mg, 2.5 mg, Nebulization, Q2H PRN, Agbata, Tochukwu, MD ?  Ampicillin-Sulbactam (UNASYN) 3 g in sodium chloride 0.9 % 100 mL IVPB, 3 g, Intravenous, Q8H, Agbata, Tochukwu, MD ?  aspirin EC tablet 81 mg, 81 mg, Oral, Daily, Agbata, Tochukwu, MD ?  atenolol (TENORMIN) tablet 25 mg, 25 mg, Oral, Daily, Agbata, Tochukwu, MD ?  atorvastatin (LIPITOR) tablet 40 mg, 40 mg, Oral, QPM, Agbata, Tochukwu, MD ?  donepezil (ARICEPT) tablet 5 mg, 5 mg, Oral, QPM, Agbata, Tochukwu, MD ?  enoxaparin (LOVENOX) injection 50 mg, 0.5 mg/kg, Subcutaneous, Q24H, Agbata, Tochukwu, MD ?  escitalopram (  LEXAPRO) tablet 20 mg, 20 mg, Oral, Daily, Agbata, Tochukwu, MD ?  ezetimibe (ZETIA) tablet 10 mg, 10 mg, Oral, Daily, Agbata, Tochukwu, MD ?  guaiFENesin (MUCINEX) 12 hr tablet 1,200 mg, 1,200 mg, Oral, BID, Agbata, Tochukwu, MD ?  ipratropium-albuterol (DUONEB) 0.5-2.5 (3) MG/3ML nebulizer solution 3 mL, 3 mL, Nebulization, Q6H, Agbata, Tochukwu, MD ?  lamoTRIgine (LAMICTAL) tablet 200 mg, 200 mg, Oral, Daily, Agbata, Tochukwu, MD ?  methylPREDNISolone sodium succinate (SOLU-MEDROL) 40 mg/mL injection 40 mg, 40 mg, Intravenous, Q12H **FOLLOWED BY** [START ON 02/07/2022]  predniSONE (DELTASONE) tablet 40 mg, 40 mg, Oral, Q breakfast, Agbata, Tochukwu, MD ?  [START ON 02/06/2022] mometasone-formoterol (DULERA) 200-5 MCG/ACT inhaler 2 puff, 2 puff, Inhalation, BID, Agbata, Tochukwu, MD ?  OLANZapine (ZYPREXA) tablet 1.25 mg, 1.25 mg, Oral, Daily, Agbata, Tochukwu, MD ?  ondansetron (ZOFRAN) tablet 4 mg, 4 mg, Oral, Q6H PRN **OR** ondansetron (ZOFRAN) injection 4 mg, 4 mg, Intravenous, Q6H PRN, Agbata, Tochukwu, MD ?  pantoprazole (PROTONIX) EC tablet 40 mg, 40 mg, Oral, Daily, Agbata, Tochukwu, MD ?  sodium chloride flush (NS) 0.9 % injection 3 mL, 3 mL, Intravenous, Q12H, Agbata, Tochukwu, MD ?  sodium chloride flush (NS) 0.9 % injection 3 mL, 3 mL, Intravenous, PRN, Agbata, Tochukwu, MD ?  sodium chloride HYPERTONIC 3 % nebulizer solution 4 mL, 4 mL, Nebulization, Daily, Agbata, Tochukwu, MD ? ?Current Outpatient Medications:  ?  albuterol (ACCUNEB) 1.25 MG/3ML nebulizer solution, Take 3 mLs (1.25 mg total) by nebulization every 4 (four) hours as needed for wheezing or shortness of breath., Disp: 75 mL, Rfl: 1 ?  albuterol (VENTOLIN HFA) 108 (90 Base) MCG/ACT inhaler, Inhale 1-2 puffs into the lungs every 6 (six) hours as needed., Disp: , Rfl:  ?  aspirin 81 MG EC tablet, Take 81 mg by mouth daily., Disp: , Rfl:  ?  atenolol (TENORMIN) 25 MG tablet, Take 25 mg by mouth daily., Disp: , Rfl:  ?  atorvastatin (LIPITOR) 40 MG tablet, Take 40 mg by mouth daily., Disp: , Rfl:  ?  benzonatate (TESSALON PERLES) 100 MG capsule, Take 1 capsule (100 mg total) by mouth 3 (three) times daily as needed for cough., Disp: 30 capsule, Rfl: 0 ?  chlorpheniramine-HYDROcodone (TUSSIONEX PENNKINETIC ER) 10-8 MG/5ML, Take 5 mLs by mouth every 12 (twelve) hours as needed for cough., Disp: 70 mL, Rfl: 0 ?  donepezil (ARICEPT) 5 MG tablet, Take 5 mg by mouth daily., Disp: , Rfl:  ?  doxycycline (VIBRAMYCIN) 100 MG capsule, Take 1 capsule (100 mg total) by mouth 2 (two) times daily for 10 days., Disp: 20  capsule, Rfl: 0 ?  escitalopram (LEXAPRO) 20 MG tablet, Take 20 mg by mouth daily., Disp: , Rfl:  ?  ezetimibe (ZETIA) 10 MG tablet, Take 10 mg by mouth daily., Disp: , Rfl:  ?  fluticasone-salmeterol (ADVAIR HFA) 230-21 MCG/ACT inhaler, Inhale 2 puffs into the lungs 2 (two) times daily., Disp: , Rfl:  ?  guaiFENesin (MUCINEX) 600 MG 12 hr tablet, Take 1 tablet (600 mg total) by mouth 2 (two) times daily for 15 days., Disp: 30 tablet, Rfl: 0 ?  lamoTRIgine (LAMICTAL) 200 MG tablet, Take 200 mg by mouth daily., Disp: , Rfl:  ?  OLANZapine (ZYPREXA) 2.5 MG tablet, Take 1.25 mg by mouth daily., Disp: , Rfl:  ?  omeprazole (PRILOSEC) 40 MG capsule, Take 40 mg by mouth daily., Disp: , Rfl:  ?  predniSONE (DELTASONE) 10 MG tablet, Take 4 tablets once a day for  3 days, 3 tablets once a day for 3 days, 2 tablets once a day for 3 days,1 tablet once a day for 3 days, 1/2 tablet once a day for 3 days (Patient taking differently: Take 5-40 mg by mouth See admin instructions. Take 40mg  daily for 3 days, 30mg  daily for 3 days, 20mg  daily for 3 days,10mg  daily for 3 days, 5mg  daily for 3 days.), Disp: 32 tablet, Rfl: 0 ? ? ? ?ALLERGIES  ? ?Dust mite extract and Gramineae pollens ? ? ? ? ?REVIEW OF SYSTEMS  ? ? ?Review of Systems: ? ?Gen:  Denies  fever, sweats, chills weigh loss  ?HEENT: Denies blurred vision, double vision, ear pain, eye pain, hearing loss, nose bleeds, sore throat ?Cardiac:  No dizziness, chest pain or heaviness, chest tightness,edema ?Resp:   reports dyspnea chronically  ?Gi: Denies swallowing difficulty, stomach pain, nausea or vomiting, diarrhea, constipation, bowel incontinence ?Gu:  Denies bladder incontinence, burning urine ?Ext:   Denies Joint pain, stiffness or swelling ?Skin: Denies  skin rash, easy bruising or bleeding or hives ?Endoc:  Denies polyuria, polydipsia , polyphagia or weight change ?Psych:   Denies depression, insomnia or hallucinations  ? ?Other:  All other systems negative ? ? ?VS: BP (!)  162/63   Pulse 81   Temp 98.1 ?F (36.7 ?C) (Oral)   Resp (!) 23   Ht 5\' 5"  (1.651 m)   Wt 99.8 kg   SpO2 95%   BMI 36.61 kg/m?   ? ? ? ?PHYSICAL EXAM  ? ? ?GENERAL:NAD, no fevers, chills, no weakness no fatigue ?

## 2022-02-05 NOTE — Assessment & Plan Note (Signed)
Most likely related to SSRI use ?Asymptomatic ?Judicious IV fluid resuscitation ?Repeat sodium levels in a.m. ?

## 2022-02-05 NOTE — Assessment & Plan Note (Signed)
Complicates overall prognosis and care ?Lifestyle modification and exercise has been discussed with patient in detail ?

## 2022-02-05 NOTE — Progress Notes (Signed)
PHARMACY -  BRIEF ANTIBIOTIC NOTE  ? ?Pharmacy has received consult(s) for Unasyn from an ED provider.  The patient's profile has been reviewed for ht/wt/allergies/indication/available labs.   ? ?One time order(s) placed for Unasyn 3g IV by ED provider ? ?Further antibiotics/pharmacy consults should be ordered by admitting physician if indicated.       ?                ?Thank you, ?Foye Deer ?02/05/2022  7:34 AM ? ?

## 2022-02-05 NOTE — Assessment & Plan Note (Signed)
Patient presents to the ER for the third time in 1 week for evaluation of worsening shortness of breath associated with a cough and diffuse wheezing. ?Imaging is suggestive of possible aspiration ?Continue IV Unasyn initiated in the ER ?Follow-up results of blood cultures ?

## 2022-02-05 NOTE — Plan of Care (Signed)
?  Problem: Education: ?Goal: Knowledge of disease or condition will improve ?02/05/2022 2352 by Berneta Levins, RN ?Outcome: Progressing ?02/05/2022 2352 by Berneta Levins, RN ?Outcome: Progressing ?Goal: Knowledge of the prescribed therapeutic regimen will improve ?02/05/2022 2352 by Berneta Levins, RN ?Outcome: Progressing ?02/05/2022 2352 by Berneta Levins, RN ?Outcome: Progressing ?Goal: Individualized Educational Video(s) ?02/05/2022 2352 by Berneta Levins, RN ?Outcome: Progressing ?02/05/2022 2352 by Berneta Levins, RN ?Outcome: Progressing ?  ?Problem: Activity: ?Goal: Ability to tolerate increased activity will improve ?02/05/2022 2352 by Berneta Levins, RN ?Outcome: Progressing ?02/05/2022 2352 by Berneta Levins, RN ?Outcome: Progressing ?Goal: Will verbalize the importance of balancing activity with adequate rest periods ?02/05/2022 2352 by Berneta Levins, RN ?Outcome: Progressing ?02/05/2022 2352 by Berneta Levins, RN ?Outcome: Progressing ?  ?Problem: Respiratory: ?Goal: Ability to maintain a clear airway will improve ?02/05/2022 2352 by Berneta Levins, RN ?Outcome: Progressing ?02/05/2022 2352 by Berneta Levins, RN ?Outcome: Progressing ?Goal: Levels of oxygenation will improve ?02/05/2022 2352 by Berneta Levins, RN ?Outcome: Progressing ?02/05/2022 2352 by Berneta Levins, RN ?Outcome: Progressing ?Goal: Ability to maintain adequate ventilation will improve ?02/05/2022 2352 by Berneta Levins, RN ?Outcome: Progressing ?02/05/2022 2352 by Berneta Levins, RN ?Outcome: Progressing ?  ?Problem: Activity: ?Goal: Ability to tolerate increased activity will improve ?02/05/2022 2352 by Berneta Levins, RN ?Outcome: Progressing ?02/05/2022 2352 by Berneta Levins, RN ?Outcome: Progressing ?  ?Problem: Clinical Measurements: ?Goal: Ability to maintain a body temperature in the normal range will  improve ?02/05/2022 2352 by Berneta Levins, RN ?Outcome: Progressing ?02/05/2022 2352 by Berneta Levins, RN ?Outcome: Progressing ?  ?Problem: Respiratory: ?Goal: Ability to maintain adequate ventilation will improve ?02/05/2022 2352 by Berneta Levins, RN ?Outcome: Progressing ?02/05/2022 2352 by Berneta Levins, RN ?Outcome: Progressing ?Goal: Ability to maintain a clear airway will improve ?02/05/2022 2352 by Berneta Levins, RN ?Outcome: Progressing ?02/05/2022 2352 by Berneta Levins, RN ?Outcome: Progressing ?  ?

## 2022-02-05 NOTE — Assessment & Plan Note (Signed)
Stable ?Continue Lexapro and Zyprexa ?

## 2022-02-05 NOTE — Progress Notes (Signed)
Cross Cover ?Dose of toradol ordered for coughing related side pain not relieved from percocet taken around 1700 ?

## 2022-02-05 NOTE — ED Provider Notes (Signed)
? ?[facility] ?Provider Note ? ? ? Event Date/Time  ? First MD Initiated Contact with Patient 02/05/22 0525   ?  (approximate) ? ? ?History  ? ?Shortness of Breath ? ? ?HPI ? ?Jesus Mckinney is a 66 y.o. male who returns to the ED from home with a chief complaint of cough, shortness of breath and right-sided rib pain.  Patient has a history of COPD not on home oxygen who recently moved to [place] from [place].  States the pollen has exacerbated his COPD symptoms.  He was seen in the ED for same on 4/9 and 02/03/2022.  At the last visit he was discharged on Mucinex, Tessalon Perles and doxycycline.  Reports he is doing nebulizer treatments every 1.5 hours in addition to his inhalers and cough medicines without relief of symptoms.  Endorses now hematemesis.  Denies fever, chills, abdominal pain, nausea, vomiting or dizziness. ?  ? ? ?Past Medical History  ? ?Past Medical History:  ?Diagnosis Date  ? COPD (chronic obstructive pulmonary disease) (HCC)   ? Emphysema lung (HCC)   ? ? ? ?Active Problem List  ? ?Patient Active Problem List  ? Diagnosis Date Noted  ? Pneumonia 02/05/2022  ? Persistent cough 04/19/2021  ? Stopped smoking with greater than 30 pack year history 04/19/2021  ? Arthropathy of cervical facet joint 04/16/2021  ? Dyslipidemia, goal LDL below 70 03/29/2021  ? Aortic atherosclerosis (HCC) 02/20/2021  ? Hepatic steatosis 02/20/2021  ? Renal cyst 01/11/2021  ? History of colon polyps 10/16/2020  ? 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 OSA (obstructive sleep apnea) 10/16/2020  ? Panlobular emphysema (HCC) 10/16/2020  ? Prediabetes 10/16/2020  ? Vertigo 10/16/2020  ? Weight gain 10/16/2020  ? ? ? ?Past Surgical History  ? ?Past Surgical History:  ?Procedure Laterality Date  ? JOINT REPLACEMENT    ? ? ? ?Home Medications  ? ?Prior to Admission medications   ?Medication Sig Start Date End Date Taking? Authorizing Provider  ?albuterol (ACCUNEB) 1.25 MG/3ML  nebulizer solution Take 3 mLs (1.25 mg total) by nebulization every 4 (four) hours as needed for wheezing or shortness of breath. 02/02/22 02/02/23  Tommi Rumps, PA-C  ?albuterol (VENTOLIN HFA) 108 (90 Base) MCG/ACT inhaler Inhale 1-2 puffs into the lungs every 6 (six) hours as needed. 08/13/21   [provider]  ?aspirin 81 MG EC tablet Take 81 mg by mouth daily.    [provider]  ?atenolol (TENORMIN) 25 MG tablet Take 25 mg by mouth daily. 01/30/22   [provider]  ?atorvastatin (LIPITOR) 40 MG tablet Take 40 mg by mouth daily. 06/19/21   [provider]  ?benzonatate (TESSALON PERLES) 100 MG capsule Take 1 capsule (100 mg total) by mouth 3 (three) times daily as needed for cough. 02/03/22 02/03/23  Sharman Cheek, MD  ?chlorpheniramine-HYDROcodone [facility]) 10-8 MG/5ML Take 5 mLs by mouth every 12 (twelve) hours as needed for cough. 02/02/22   Tommi Rumps, PA-C  ?donepezil (ARICEPT) 5 MG tablet Take 5 mg by mouth daily. 01/27/22   [provider]  ?doxycycline (VIBRAMYCIN) 100 MG capsule Take 1 capsule (100 mg total) by mouth 2 (two) times daily for 10 days. 02/03/22 02/13/22  Sharman Cheek, MD  ?escitalopram (LEXAPRO) 20 MG tablet Take 20 mg by mouth daily. 06/10/21   [provider]  ?ezetimibe (ZETIA) 10 MG tablet Take 10 mg by mouth daily. 01/11/22   [provider]  ?fluticasone-salmeterol (  ADVAIR HFA) 230-21 MCG/ACT inhaler Inhale 2 puffs into the lungs 2 (two) times daily. 08/22/20   [provider]  ?gabapentin (NEURONTIN) 300 MG capsule Take 300 mg by mouth 3 (three) times daily. ?Patient not taking: Reported on 09/30/2021 04/15/21   [provider]  ?guaiFENesin (MUCINEX) 600 MG 12 hr tablet Take 1 tablet (600 mg total) by mouth 2 (two) times daily for 15 days. 02/03/22 02/18/22  Sharman CheekStafford, Phillip, MD  ?lamoTRIgine (LAMICTAL) 200 MG tablet Take 200 mg by mouth daily. 06/04/21   [provider]   ?methylPREDNISolone (MEDROL DOSEPAK) 4 MG TBPK tablet See admin instructions. ?Patient not taking: Reported on 09/30/2021 07/29/21   [provider]  ?Multiple Vitamin (MULTIVITAMIN) capsule Take 1 capsule by mouth daily. ?Patient not taking: Reported on 09/30/2021    [provider]  ?OLANZapine (ZYPREXA) 2.5 MG tablet Take 1.25 mg by mouth daily. 06/10/21   [provider]  ?omeprazole (PRILOSEC) 40 MG capsule Take 40 mg by mouth daily. 06/20/21   [provider]  ?predniSONE (DELTASONE) 10 MG tablet Take 4 tablets once a day for 3 days, 3 tablets once a day for 3 days, 2 tablets once a day for 3 days,1 tablet once a day for 3 days, 1/2 tablet once a day for 3 days 02/02/22   Tommi RumpsSummers, Rhonda L, PA-C  ?valACYclovir (VALTREX) 1000 MG tablet Take 4,000 mg by mouth once. ?Patient not taking: Reported on 09/30/2021 03/18/21   [provider]  ?Benay SpiceXIIDRA 5 % SOLN  03/28/21   [provider]  ? ? ? ?Allergies  ?Dust mite extract, Gramineae pollens, and Other ? ? ?Family History  ?History reviewed. No pertinent family history. ? ? ?Physical Exam  ?Triage Vital Signs: ?ED Triage Vitals [02/05/22 0516]  ?Enc Vitals Group  ?   BP (!) 159/59  ?   Pulse Rate 75  ?   Resp 19  ?   Temp 98.1 ?F (36.7 ?C)  ?   Temp Source Oral  ?   SpO2 97 %  ?   Weight 220 lb (99.8 kg)  ?   Height 5\' 5"  (1.651 m)  ?   Head Circumference   ?   Peak Flow   ?   Pain Score 6  ?   Pain Loc   ?   Pain Edu?   ?   Excl. in GC?   ? ? ?Updated Vital Signs: ?BP (!) 162/63   Pulse 81   Temp 98.1 ?F (36.7 ?C) (Oral)   Resp (!) 23   Ht 5\' 5"  (1.651 m)   Wt 99.8 kg   SpO2 95%   BMI 36.61 kg/m?  ? ? ?General: Awake, mild distress.  ?CV:  RRR.  Good peripheral perfusion.  Right lateral chest wall pain.  Mild splinting.  No crepitus. ?Resp:  Normal effort.  Diminished aeration. ?Abd:  Nontender.  No distention.  ?Other:  Bilateral calves are nontender and nonswollen. ? ? ?ED Results / Procedures / Treatments   ?Labs ?(all labs ordered are listed, but only abnormal results are displayed) ?Labs Reviewed  ?BASIC METABOLIC PANEL - Abnormal; Notable for the following components:  ?    Result Value  ? Sodium 125 (*)   ? Chloride 95 (*)   ? CO2 20 (*)   ? Calcium 8.5 (*)   ? All other components within normal limits  ?CBC - Abnormal; Notable for the following components:  ? WBC 13.9 (*)   ? RBC  4.15 (*)   ? All other components within normal limits  ?RESP PANEL BY RT-PCR (FLU A&B, COVID) [id number]  ?CULTURE, BLOOD (ROUTINE X 2)  ?CULTURE, BLOOD (ROUTINE X 2)  ?LACTIC ACID, PLASMA  ?LACTIC ACID, PLASMA  ?TROPONIN I (HIGH SENSITIVITY)  ?TROPONIN I (HIGH SENSITIVITY)  ? ? ? ?EKG ? ?ED ECG REPORT ?I, Irean Hong, the attending physician, personally viewed and interpreted this ECG. ? ? Date: 02/05/2022 ? EKG Time: 0514 ? Rate: 77 ? Rhythm: normal sinus rhythm ? Axis: Normal ? Intervals: QTc 497 ? ST&T Change: Nonspecific ? ? ? ?RADIOLOGY ?I have independently visualized and interpreted patient's chest x-ray, CTA chest as well as noted the radiology interpretation: ? ?Chest x-ray: Low lung volumes, otherwise unremarkable ? ?CTA chest: No PE, possible sequelae of mild aspiration; left lower lobe nodular density ? ?Official radiology report(s): ?DG Chest 2 View ? ?Result Date: 02/05/2022 ?CLINICAL DATA:  66 year old male with history of cough and shortness of breath. EXAM: CHEST - 2 VIEW COMPARISON:  Chest x-ray 02/03/2022. FINDINGS: Lung volumes are low. No consolidative airspace disease. No pleural effusions. No pneumothorax. No pulmonary nodule or mass noted. Pulmonary vasculature and the cardiomediastinal silhouette are within normal limits. IMPRESSION: 1. Low lung volumes without radiographic evidence of acute cardiopulmonary disease. Electronically Signed   By: Trudie Reed M.D.   On: 02/05/2022 05:57  ? ?CT Angio Chest PE W/Cm &/Or Wo Cm ? ?Result Date: 02/05/2022 ?CLINICAL DATA:  66 year old male with history of cough and  shortness of breath since Saturday. Evaluate for pulmonary embolism. EXAM: CT ANGIOGRAPHY CHEST WITH CONTRAST TECHNIQUE: Multidetector CT imaging of the chest was performed using the standard protocol during bolus administration

## 2022-02-05 NOTE — Progress Notes (Signed)
*  PRELIMINARY RESULTS* ?Echocardiogram ?2D Echocardiogram has been performed. ? ?Jesus Mckinney ?02/05/2022, 2:31 PM ?

## 2022-02-05 NOTE — Assessment & Plan Note (Signed)
Stable ?Continue aspirin, atorvastatin and atenolol ?

## 2022-02-05 NOTE — TOC Initial Note (Signed)
Transition of Care (TOC) - Initial/Assessment Note  ? ? ?Patient Details  ?Name: Jesus Mckinney ?MRN: [id number] ?Date of Birth: 24-Jul-1956 ? ?Transition of Care (TOC) CM/SW Contact:    ?Marlowe Sax, RN ?Phone Number: ?02/05/2022, 3:12 PM ? ?Clinical Narrative:                 ?Patient has a history of COPD not on home oxygen who recently moved to [place] from .  ? ?Has been in the ED for the same symptoms 3 times in the last week,  ? ? He has used his nebulizer at home with transient improvement. ?  ?He is listed to go to [facility] for PCP and gets meds from [facility] pharmacy ?TOC to monitor for needs and assist with DC planning ? ?Patient Goals and CMS Choice ?  ?  ?  ?Expected Discharge Plan and Services ?  ?  ?  ?  ?  ?                ?  ?  ?  ?  ?  ?  ?  ?  ?  ?  ? ?Prior Living Arrangements/Services ?  ?  ?  ?       ?  ?  ?  ?  ? ?Activities of Daily Living ?  ?  ? ?Permission Sought/Granted ?  ?  ?   ?   ?   ?   ? ?Emotional Assessment ?  ?  ?  ?  ?  ?  ? ?Admission diagnosis:  Shortness of breath [R06.02] ?Hyponatremia [E87.1] ?Pneumonia [J18.9] ?COPD exacerbation (HCC) [J44.1] ?Rib pain on right side [R07.81] ?Aspiration pneumonia, unspecified aspiration pneumonia type, unspecified laterality, unspecified part of lung (HCC) [J69.0] ?Patient Active Problem List  ? Diagnosis Date Noted  ? Pneumonia 02/05/2022  ? COPD with acute exacerbation (HCC) 02/05/2022  ? Obesity (BMI 30-39.9) 02/05/2022  ? Hyponatremia 02/05/2022  ? Persistent cough 04/19/2021  ? Stopped smoking with greater than 30 pack year history 04/19/2021  ? Arthropathy of cervical facet joint 04/16/2021  ? Dyslipidemia, goal LDL below 70 03/29/2021  ? Aortic atherosclerosis (HCC) 02/20/2021  ? Hepatic steatosis 02/20/2021  ? Renal cyst 01/11/2021  ? History of colon polyps 10/16/2020  ? 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 OSA (obstructive sleep apnea) 10/16/2020  ? Panlobular emphysema  (HCC) 10/16/2020  ? Prediabetes 10/16/2020  ? Vertigo 10/16/2020  ? Weight gain 10/16/2020  ? ?PCP:  [facility] ?Pharmacy:   ?[facility] [id number] [place], [place] - [address] ?[address] ?[place] [address] ?Phone: [phone] Fax: [phone] ? ?[id number] [place], [place] - [address] ?[address] ?[place] [address] ?Phone: [phone] Fax: [phone] ? ? ? ? ?Social Determinants of Health (SDOH) Interventions ?  ? ?Readmission Risk Interventions ?   ? View : No data to display.  ?  ?  ?  ? ? ? ?

## 2022-02-05 NOTE — ED Notes (Signed)
Informed RN bed assigned 

## 2022-02-05 NOTE — Plan of Care (Signed)
?  Problem: Education: ?Goal: Knowledge of disease or condition will improve ?Outcome: Progressing ?Goal: Knowledge of the prescribed therapeutic regimen will improve ?Outcome: Progressing ?Goal: Individualized Educational Video(s) ?Outcome: Progressing ?  ?Problem: Activity: ?Goal: Ability to tolerate increased activity will improve ?Outcome: Progressing ?Goal: Will verbalize the importance of balancing activity with adequate rest periods ?Outcome: Progressing ?  ?Problem: Respiratory: ?Goal: Ability to maintain a clear airway will improve ?Outcome: Progressing ?Goal: Levels of oxygenation will improve ?Outcome: Progressing ?Goal: Ability to maintain adequate ventilation will improve ?Outcome: Progressing ?  ?Problem: Activity: ?Goal: Ability to tolerate increased activity will improve ?Outcome: Progressing ?  ?Problem: Clinical Measurements: ?Goal: Ability to maintain a body temperature in the normal range will improve ?Outcome: Progressing ?  ?Problem: Respiratory: ?Goal: Ability to maintain adequate ventilation will improve ?Outcome: Progressing ?Goal: Ability to maintain a clear airway will improve ?Outcome: Progressing ?  ?

## 2022-02-05 NOTE — Assessment & Plan Note (Signed)
Patient with an underlying history of COPD who presents for evaluation of worsening shortness of breath associated with a cough, hemoptysis and diffuse wheezing. ?Place patient on scheduled and as needed bronchodilator therapy ?Place patient on systemic steroids ?We will start patient on antitussive ?

## 2022-02-05 NOTE — H&P (Signed)
?History and Physical  ? ? ?Patient: Jesus Mckinney [id number] DOB: 02-14-1956 ?DOA: 02/05/2022 ?DOS: the patient was seen and examined on 02/05/2022 ?PCP: [facility], [facility]  ?Patient coming from: Home ? ?Chief Complaint:  ?Chief Complaint  ?Patient presents with  ? Shortness of Breath  ? ?HPI: Jesus Mckinney is a 66 y.o. male with medical history significant for COPD, obesity, depression who presents to the emergency room for the third time in 1 week for evaluation of worsening shortness of breath and a cough. ?Patient has had symptoms for about 4 days and states that it is progressively getting worse.  He has difficulty expectorating phlegm but has had episodes of hemoptysis.  He complains of severe pain in his right lateral chest wall and thinks he may have broken a rib or pulled a muscle from persistent cough.  He denies having any fever or chills.  He was prescribed systemic steroids, antibiotics and antitussives without any significant improvement in his symptoms.  He has used his nebulizer at home with transient improvement. ?He denies having any nausea, no vomiting, no dizziness, no headache, no lightheadedness, no urinary symptoms, no abdominal pain, no leg swelling, no blurred vision no focal deficit. ? ?Review of Systems: As mentioned in the history of present illness. All other systems reviewed and are negative. ?Past Medical History:  ?Diagnosis Date  ? COPD (chronic obstructive pulmonary disease) ([place])   ? Emphysema lung ([place])   ? ?Past Surgical History:  ?Procedure Laterality Date  ? JOINT REPLACEMENT    ? ?Social History:  reports that he has quit smoking. His smoking use included cigarettes. He has never used smokeless tobacco. He reports that he does not currently use alcohol. He reports that he does not currently use drugs. ? ?Allergies  ?Allergen Reactions  ? Dust Mite Extract Cough  ? Gramineae Pollens Cough  ? ? ?History reviewed. No pertinent family history. ? ?Prior to  Admission medications   ?Medication Sig Start Date End Date Taking? Authorizing Provider  ?albuterol (ACCUNEB) 1.25 MG/3ML nebulizer solution Take 3 mLs (1.25 mg total) by nebulization every 4 (four) hours as needed for wheezing or shortness of breath. 02/02/22 02/02/23 Yes Summers, Rhonda L, PA-C  ?albuterol (VENTOLIN HFA) 108 (90 Base) MCG/ACT inhaler Inhale 1-2 puffs into the lungs every 6 (six) hours as needed. 08/13/21  Yes [provider]  ?aspirin 81 MG EC tablet Take 81 mg by mouth daily.   Yes [provider]  ?atenolol (TENORMIN) 25 MG tablet Take 25 mg by mouth daily. 01/30/22  Yes [provider]  ?atorvastatin (LIPITOR) 40 MG tablet Take 40 mg by mouth daily. 06/19/21  Yes [provider]  ?benzonatate (TESSALON PERLES) 100 MG capsule Take 1 capsule (100 mg total) by mouth 3 (three) times daily as needed for cough. 02/03/22 02/03/23 Yes Carrie Mew, MD  ?chlorpheniramine-HYDROcodone [facility]) 10-8 MG/5ML Take 5 mLs by mouth every 12 (twelve) hours as needed for cough. 02/02/22  Yes Letitia Neri L, PA-C  ?donepezil (ARICEPT) 5 MG tablet Take 5 mg by mouth daily. 01/27/22  Yes [provider]  ?escitalopram (LEXAPRO) 20 MG tablet Take 20 mg by mouth daily. 06/10/21  Yes [provider]  ?ezetimibe (ZETIA) 10 MG tablet Take 10 mg by mouth daily. 01/11/22  Yes [provider]  ?fluticasone-salmeterol (ADVAIR HFA) 230-21 MCG/ACT inhaler Inhale 2 puffs into the lungs 2 (two) times daily. 08/22/20  Yes [provider]  ?guaiFENesin ([place]) 600 MG 12  hr tablet Take 1 tablet (600 mg total) by mouth 2 (two) times daily for 15 days. 02/03/22 02/18/22 Yes Carrie Mew, MD  ?lamoTRIgine (LAMICTAL) 200 MG tablet Take 200 mg by mouth daily. 06/04/21  Yes [provider]  ?OLANZapine (ZYPREXA) 2.5 MG tablet Take 1.25 mg by mouth daily. 06/10/21  Yes [provider]  ?omeprazole (PRILOSEC) 40 MG capsule Take 40 mg by  mouth daily. 06/20/21  Yes [provider]  ?predniSONE (DELTASONE) 10 MG tablet Take 4 tablets once a day for 3 days, 3 tablets once a day for 3 days, 2 tablets once a day for 3 days,1 tablet once a day for 3 days, 1/2 tablet once a day for 3 days ?Patient taking differently: Take 5-40 mg by mouth See admin instructions. Take 40mg  daily for 3 days, 30mg  daily for 3 days, 20mg  daily for 3 days,10mg  daily for 3 days, 5mg  daily for 3 days. 02/02/22  Yes [name], PA-C  ?doxycycline (VIBRAMYCIN) 100 MG capsule Take 1 capsule (100 mg total) by mouth 2 (two) times daily for 10 days. 02/03/22 02/13/22  Carrie Mew, MD  ? ? ?Physical Exam: ?Vitals:  ? 02/05/22 0516 02/05/22 0639  ?BP: (!) 159/59 (!) 162/63  ?Pulse: 75 81  ?Resp: 19 (!) 23  ?Temp: 98.1 ?F (36.7 ?C)   ?TempSrc: Oral   ?SpO2: 97% 95%  ?Weight: 99.8 kg   ?Height: 5\' 5"  (1.651 m)   ? ?Physical Exam ?Vitals and nursing note reviewed.  ?Constitutional:   ?   Appearance: He is obese.  ?HENT:  ?   Head: Normocephalic and atraumatic.  ?   Mouth/Throat:  ?   Mouth: Mucous membranes are moist.  ?Eyes:  ?   Pupils: Pupils are equal, round, and reactive to light.  ?Cardiovascular:  ?   Rate and Rhythm: Normal rate and regular rhythm.  ?Pulmonary:  ?   Effort: Tachypnea present.  ?   Breath sounds: Examination of the right-upper field reveals wheezing. Examination of the left-upper field reveals wheezing. Examination of the right-middle field reveals wheezing. Examination of the left-middle field reveals wheezing. Examination of the right-lower field reveals wheezing. Examination of the left-lower field reveals wheezing. Wheezing present.  ?Chest:  ?   Chest wall: Tenderness present.  ?   Comments: Right lateral chest wall ?Abdominal:  ?   General: Bowel sounds are normal.  ?   Palpations: Abdomen is soft.  ?Musculoskeletal:     ?   General: Normal range of motion.  ?   Cervical back: Normal range of motion and neck supple.  ?Skin: ?   General: Skin is  warm and dry.  ?Neurological:  ?   General: No focal deficit present.  ?   Mental Status: He is alert.  ?Psychiatric:     ?   Mood and Affect: Mood normal.     ?   Behavior: Behavior normal.  ? ? ?Data Reviewed: ?Relevant notes from primary care and specialist visits, past discharge summaries as available in EHR, including Care Everywhere. ?Prior diagnostic testing as pertinent to current admission diagnoses ?Updated medications and problem lists for reconciliation ?ED course, including vitals, labs, imaging, treatment and response to treatment ?Triage notes, nursing and pharmacy notes and ED provider's notes ?Notable results as noted in HPI ?Labs reviewed.  Lactic acid 3.5, troponin 13, sodium 125, potassium 3.7, chloride 95, bicarb 20, glucose 96, BUN 23, creatinine 1.23, calcium 8.5, white count 13.9, hemoglobin 13.7, hematocrit 40.8, MCV 98.3, RDW  12.8, platelet count 282 ?Respiratory viral panel is negative ?Chest x-ray reviewed by me shows Low lung volumes without radiographic evidence of acute ?cardiopulmonary disease. ?CT angiogram of the chest shows what appears to be passive subsegmental atelectasis in ?the lower lobes of the lungs bilaterally, favored to predominantly reflect areas of passive atelectasis, although sequela of mild aspiration could have a similar appearance. ? In the left lower lobe (axial image 104 of series 5) there is a 1.6 x 0.9 cm nodular density. In the acute setting, this could be of infectious or inflammatory etiology, however, underlying neoplasm is not excluded. Follow-up noncontrast CT in 3 months is recommended to ensure resolution of this finding.  ?Aortic atherosclerosis, in addition to left main and three-vessel coronary artery disease. Mild cardiomegaly. ?Twelve-lead EKG reviewed by me shows sinus rhythm with an incomplete right bundle branch block. ?There are no new results to review at this time. ? ?Assessment and Plan: ?* Pneumonia ?Patient presents to the ER for the  third time in 1 week for evaluation of worsening shortness of breath associated with a cough and diffuse wheezing. ?Imaging is suggestive of possible aspiration ?Continue IV Unasyn initiated in the ER ?Follow-up r

## 2022-02-06 DIAGNOSIS — J204 Acute bronchitis due to parainfluenza virus: Secondary | ICD-10-CM

## 2022-02-06 DIAGNOSIS — J189 Pneumonia, unspecified organism: Secondary | ICD-10-CM | POA: Diagnosis not present

## 2022-02-06 DIAGNOSIS — J441 Chronic obstructive pulmonary disease with (acute) exacerbation: Secondary | ICD-10-CM | POA: Diagnosis not present

## 2022-02-06 LAB — CBC
HCT: 38.2 % — ABNORMAL LOW (ref 39.0–52.0)
Hemoglobin: 12.7 g/dL — ABNORMAL LOW (ref 13.0–17.0)
MCH: 32.1 pg (ref 26.0–34.0)
MCHC: 33.2 g/dL (ref 30.0–36.0)
MCV: 96.5 fL (ref 80.0–100.0)
Platelets: 239 K/uL (ref 150–400)
RBC: 3.96 MIL/uL — ABNORMAL LOW (ref 4.22–5.81)
RDW: 12.9 % (ref 11.5–15.5)
WBC: 10.1 K/uL (ref 4.0–10.5)
nRBC: 0 % (ref 0.0–0.2)

## 2022-02-06 LAB — BASIC METABOLIC PANEL WITH GFR
Anion gap: 6 (ref 5–15)
BUN: 19 mg/dL (ref 8–23)
CO2: 20 mmol/L — ABNORMAL LOW (ref 22–32)
Calcium: 8 mg/dL — ABNORMAL LOW (ref 8.9–10.3)
Chloride: 105 mmol/L (ref 98–111)
Creatinine, Ser: 1.16 mg/dL (ref 0.61–1.24)
GFR, Estimated: >60 mL/min
Glucose, Bld: 132 mg/dL — ABNORMAL HIGH (ref 70–99)
Potassium: 3.8 mmol/L (ref 3.5–5.1)
Sodium: 131 mmol/L — ABNORMAL LOW (ref 135–145)

## 2022-02-06 LAB — C-REACTIVE PROTEIN: CRP: 0.5 mg/dL (ref <1.0)

## 2022-02-06 LAB — HIV ANTIBODY (ROUTINE TESTING W REFLEX): HIV Screen 4th Generation wRfx: NONREACTIVE

## 2022-02-06 LAB — PROCALCITONIN: Procalcitonin: <0.1 ng/mL

## 2022-02-06 MED ORDER — IPRATROPIUM-ALBUTEROL 0.5-2.5 (3) MG/3ML IN SOLN
3.0000 mL | Freq: Four times a day (QID) | RESPIRATORY_TRACT | 1 refills | Status: DC | PRN
Start: 2022-02-06 — End: 2023-09-29

## 2022-02-06 MED ORDER — PREDNISONE 20 MG PO TABS: 40.0000 mg | ORAL_TABLET | Freq: Every day | ORAL | 0 refills | Status: AC

## 2022-02-06 MED ORDER — AMOXICILLIN-POT CLAVULANATE 875-125 MG PO TABS: 1.0000 | ORAL_TABLET | Freq: Two times a day (BID) | ORAL | 0 refills | Status: AC

## 2022-02-06 MED ORDER — IBUPROFEN 200 MG PO TABS: 400.0000 mg | ORAL_TABLET | Freq: Three times a day (TID) | ORAL | Status: DC | PRN

## 2022-02-06 MED ORDER — ALUM & MAG HYDROXIDE-SIMETH 200-200-20 MG/5ML PO SUSP
30.0000 mL | Freq: Once | ORAL | Status: AC
  Administered 2022-02-06: 30 mL via ORAL
  Filled 2022-02-06: qty 30

## 2022-02-06 MED ORDER — KETOROLAC TROMETHAMINE 15 MG/ML IJ SOLN
15.0000 mg | Freq: Four times a day (QID) | INTRAMUSCULAR | Status: DC
  Administered 2022-02-06: 15 mg via INTRAVENOUS
  Filled 2022-02-06: qty 1

## 2022-02-06 NOTE — Care Management Obs Status (Signed)
MEDICARE OBSERVATION STATUS NOTIFICATION ? ? ?Patient Details  ?Name: Jesus Mckinney ?MRN: [id number] ?Date of Birth: 06/24/1956 ? ? ?Medicare Observation Status Notification Given:    ? ? ? ?Marlowe Sax, RN ?02/06/2022, 1:02 PM ?

## 2022-02-06 NOTE — Discharge Summary (Signed)
?Physician Discharge Summary ?  ?Patient: Jesus Mckinney MRN: [id number] DOB: 1956/10/27  ?Admit date:     02/05/2022  ?Discharge date: 02/06/22  ?Discharge Physician: Lewie Chamber  ? ?PCP: [facility], [facility]  ? ?Recommendations at discharge:  ? ?Needs repeat CT chest in approximately 3 months for left lower lobe nodular density evaluation ?Follow-up with pulmonology ? ?Discharge Diagnoses: ?Principal Problem: ?  Pneumonia ?Active Problems: ?  COPD with acute exacerbation (HCC) ?  Aortic atherosclerosis (HCC) ?  Mild recurrent major depression (HCC) ?  Obesity (BMI 30-39.9) ?  Hyponatremia ?  COPD exacerbation (HCC) ? ?Resolved Problems: ?  * No resolved hospital problems. * ? ?Hospital Course: ?[name] is a 66 year old male with PMH COPD, obesity, depression who presented with worsening shortness of breath and cough.  Symptoms have been going on for approximately 4 to 5 days prior to admission with no significant improvement.  He was having thickened sputum and developing pain in his right chest wall from severe coughing at home. ?CXR on admission showed no acute findings.  CTA chest was then obtained which showed subsegmental atelectasis in the lower lobes bilaterally.  Also noted to have a left lower lobe nodular density with recommendation for outpatient repeat CT in 3 months. ?Dedicated CXR to the right sided ribs also was negative for any rib fractures.  His pain complaints were considered musculoskeletal from significant coughing at home prior to admission.  He did respond well to Toradol in the hospital and was recommended to continue on OTC ibuprofen short term at home. ?He was also evaluated by pulmonology during hospitalization. ?RVP was positive for parainfluenza virus.  He had also been started empirically on Augmentin in case of some possible aspiration prior to hospitalization.  He was continued on Augmentin at discharge especially in setting of his underlying COPD.  Short steroid course  also prescribed as well as nebulizers per patient request. ?He will follow-up outpatient with pulmonology for probable repeat CT. ? ? ?  ? ? ?Consultants: Pulmonology  ?Procedures performed:   ?Disposition: Home ?Diet recommendation:  ?Discharge Diet Orders (From admission, onward)  ? ?  Start     Ordered  ? 02/06/22 0000  Diet - low sodium heart healthy       ? 02/06/22 1245  ? 02/06/22 0000  Diet - low sodium heart healthy       ? 02/06/22 1245  ? ?  ?  ? ?  ? ?Cardiac diet ?DISCHARGE MEDICATION: ?Allergies as of 02/06/2022   ? ?   Reactions  ? Dust Mite Extract Cough  ? Gramineae Pollens Cough  ? ?  ? ?  ?Medication List  ?  ? ?STOP taking these medications   ? ?doxycycline 100 MG capsule ?Commonly known as: VIBRAMYCIN ?  ? ?  ? ?TAKE these medications   ? ?Advair HFA 230-21 MCG/ACT inhaler ?Generic drug: fluticasone-salmeterol ?Inhale 2 puffs into the lungs 2 (two) times daily. ?  ?albuterol 108 (90 Base) MCG/ACT inhaler ?Commonly known as: VENTOLIN HFA ?Inhale 1-2 puffs into the lungs every 6 (six) hours as needed. ?  ?albuterol 1.25 MG/3ML nebulizer solution ?Commonly known as: ACCUNEB ?Take 3 mLs (1.25 mg total) by nebulization every 4 (four) hours as needed for wheezing or shortness of breath. ?  ?amoxicillin-clavulanate 875-125 MG tablet ?Commonly known as: Augmentin ?Take 1 tablet by mouth 2 (two) times daily for 6 days. ?  ?aspirin 81 MG EC tablet ?Take 81 mg by mouth daily. ?  ?  atenolol 25 MG tablet ?Commonly known as: TENORMIN ?Take 25 mg by mouth daily. ?  ?atorvastatin 40 MG tablet ?Commonly known as: LIPITOR ?Take 40 mg by mouth daily. ?  ?benzonatate 100 MG capsule ?Commonly known as: LawyerTessalon Perles ?Take 1 capsule (100 mg total) by mouth 3 (three) times daily as needed for cough. ?  ?chlorpheniramine-HYDROcodone 10-8 MG/5ML ?Commonly known as: Tussionex Pennkinetic ER ?Take 5 mLs by mouth every 12 (twelve) hours as needed for cough. ?  ?donepezil 5 MG tablet ?Commonly known as: ARICEPT ?Take 5 mg  by mouth daily. ?  ?escitalopram 20 MG tablet ?Commonly known as: LEXAPRO ?Take 20 mg by mouth daily. ?  ?ezetimibe 10 MG tablet ?Commonly known as: ZETIA ?Take 10 mg by mouth daily. ?  ?guaiFENesin 600 MG 12 hr tablet ?Commonly known as: Mucinex ?Take 1 tablet (600 mg total) by mouth 2 (two) times daily for 15 days. ?  ?ibuprofen 200 MG tablet ?Commonly known as: ADVIL ?Take 2 tablets (400 mg total) by mouth every 8 (eight) hours as needed for mild pain or moderate pain. ?  ?ipratropium-albuterol 0.5-2.5 (3) MG/3ML Soln ?Commonly known as: DUONEB ?Take 3 mLs by nebulization every 6 (six) hours as needed. ?  ?lamoTRIgine 200 MG tablet ?Commonly known as: LAMICTAL ?Take 200 mg by mouth daily. ?  ?OLANZapine 2.5 MG tablet ?Commonly known as: ZYPREXA ?Take 1.25 mg by mouth daily. ?  ?omeprazole 40 MG capsule ?Commonly known as: PRILOSEC ?Take 40 mg by mouth daily. ?  ?predniSONE 20 MG tablet ?Commonly known as: DELTASONE ?Take 2 tablets (40 mg total) by mouth daily with breakfast for 5 days. ?Start taking on: February 07, 2022 ?What changed:  ?medication strength ?how much to take ?how to take this ?when to take this ?additional instructions ?  ? ?  ? ? Follow-up Information   ? ? Vida RiggerAleskerov, Fuad, MD. Schedule an appointment as soon as possible for a visit in 1 month(s).   ?Specialty: Pulmonary Disease ?Contact information: ?[address] ?[place] [id number] ?[phone] ? ? ?  ?  ? ?  ?  ? ?  ? ?Discharge Exam: ?Filed Weights  ? 02/05/22 0516  ?Weight: 99.8 kg  ? ?Physical Exam ?Constitutional:   ?   General: He is not in acute distress. ?   Appearance: Normal appearance. He is well-developed.  ?HENT:  ?   Head: Normocephalic and atraumatic.  ?   Mouth/Throat:  ?   Mouth: Mucous membranes are moist.  ?Eyes:  ?   Extraocular Movements: Extraocular movements intact.  ?Cardiovascular:  ?   Rate and Rhythm: Normal rate and regular rhythm.  ?   Heart sounds: Normal heart sounds.  ?Pulmonary:  ?   Effort: Pulmonary  effort is normal. No respiratory distress.  ?   Breath sounds: No wheezing.  ?   Comments: Coarse breath sounds bilaterally ?Abdominal:  ?   General: Bowel sounds are normal. There is no distension.  ?   Palpations: Abdomen is soft.  ?   Tenderness: There is no abdominal tenderness.  ?Musculoskeletal:     ?   General: Normal range of motion.  ?   Cervical back: Normal range of motion and neck supple.  ?Skin: ?   General: Skin is warm and dry.  ?Neurological:  ?   General: No focal deficit present.  ?   Mental Status: He is alert.  ?Psychiatric:     ?   Mood and Affect: Mood normal.     ?  Behavior: Behavior normal.  ? ? ? ?Condition at discharge: stable ? ?The results of significant diagnostics from this hospitalization (including imaging, microbiology, ancillary and laboratory) are listed below for reference.  ? ?Imaging Studies: ?DG Chest 2 View ? ?Result Date: 02/05/2022 ?CLINICAL DATA:  66 year old male with history of cough and shortness of breath. EXAM: CHEST - 2 VIEW COMPARISON:  Chest x-ray 02/03/2022. FINDINGS: Lung volumes are low. No consolidative airspace disease. No pleural effusions. No pneumothorax. No pulmonary nodule or mass noted. Pulmonary vasculature and the cardiomediastinal silhouette are within normal limits. IMPRESSION: 1. Low lung volumes without radiographic evidence of acute cardiopulmonary disease. Electronically Signed   By: Trudie Reed M.D.   On: 02/05/2022 05:57  ? ?DG Chest 2 View ? ?Result Date: 02/02/2022 ?CLINICAL DATA:  Cough and congestion for 4 days.  History of COPD. EXAM: CHEST - 2 VIEW COMPARISON:  09/30/2021. FINDINGS: Cardiac silhouette normal in size and configuration. Normal mediastinal and hilar contours. Mild reticular opacity at the left lung base consistent with scarring, stable. Lungs otherwise clear. No pleural effusion or pneumothorax. Skeletal structures are grossly intact. IMPRESSION: No active cardiopulmonary disease. Electronically Signed   By: Amie Portland  M.D.   On: 02/02/2022 11:08  ? ?DG Ribs Unilateral Right ? ?Result Date: 02/05/2022 ?CLINICAL DATA:  Right rib pain EXAM: RIGHT RIBS - 2 VIEW COMPARISON:  None. FINDINGS: No fracture or other bone lesi

## 2022-02-06 NOTE — Care Management CC44 (Signed)
Condition Code 44 Documentation Completed ? ?Patient Details  ?Name: Jesus Mckinney ?MRN: [id number] ?Date of Birth: [birth year] ? ? ?Condition Code 44 given:    ?Patient signature on Condition Code 44 notice:    ?Documentation of 2 MD's agreement:    ?Code 44 added to claim:    ? ? ? ?Conception Oms, RN ?02/06/2022, 1:02 PM ? ?

## 2022-02-06 NOTE — Care Management Obs Status (Signed)
MEDICARE OBSERVATION STATUS NOTIFICATION ? ? ?Patient Details  ?Name: Jesus Mckinney ?MRN: [id number] ?Date of Birth: October 18, 1956 ? ? ?Medicare Observation Status Notification Given:    ? ?yes ? ?Marlowe Sax, RN ?02/06/2022, 12:57 PM ?

## 2022-02-06 NOTE — Hospital Course (Signed)
Mr. Harrison is a 66 year old male with PMH COPD, obesity, depression who presented with worsening shortness of breath and cough.  Symptoms have been going on for approximately 4 to 5 days prior to admission with no significant improvement.  He was having thickened sputum and developing pain in his right chest wall from severe coughing at home. ?CXR on admission showed no acute findings.  CTA chest was then obtained which showed subsegmental atelectasis in the lower lobes bilaterally.  Also noted to have a left lower lobe nodular density with recommendation for outpatient repeat CT in 3 months. ?Dedicated CXR to the right sided ribs also was negative for any rib fractures.  His pain complaints were considered musculoskeletal from significant coughing at home prior to admission.  He did respond well to Toradol in the hospital and was recommended to continue on OTC ibuprofen short term at home. ?He was also evaluated by pulmonology during hospitalization. ?RVP was positive for parainfluenza virus.  He had also been started empirically on Augmentin in case of some possible aspiration prior to hospitalization.  He was continued on Augmentin at discharge especially in setting of his underlying COPD.  Short steroid course also prescribed as well as nebulizers per patient request. ?He will follow-up outpatient with pulmonology for probable repeat CT. ?

## 2022-02-06 NOTE — Care Management CC44 (Signed)
Condition Code 44 Documentation Completed ? ?Patient Details  ?Name: Jesus Mckinney ?MRN: [id number] ?Date of Birth: 08/06/1956 ? ? ?Condition Code 44 given:  Yes ?Patient signature on Condition Code 44 notice:  Yes ?Documentation of 2 MD's agreement:  Yes ?Code 44 added to claim:  Yes ? ? ? ?Conception Oms, RN ?02/06/2022, 1:03 PM ? ?

## 2022-02-06 NOTE — Plan of Care (Signed)
?  Problem: Education: ?Goal: Knowledge of disease or condition will improve ?Outcome: Adequate for Discharge ?Goal: Knowledge of the prescribed therapeutic regimen will improve ?Outcome: Adequate for Discharge ?Goal: Individualized Educational Video(s) ?Outcome: Adequate for Discharge ?  ?Problem: Activity: ?Goal: Ability to tolerate increased activity will improve ?Outcome: Adequate for Discharge ?Goal: Will verbalize the importance of balancing activity with adequate rest periods ?Outcome: Adequate for Discharge ?  ?Problem: Respiratory: ?Goal: Ability to maintain a clear airway will improve ?Outcome: Adequate for Discharge ?Goal: Levels of oxygenation will improve ?Outcome: Adequate for Discharge ?Goal: Ability to maintain adequate ventilation will improve ?Outcome: Adequate for Discharge ?  ?Problem: Activity: ?Goal: Ability to tolerate increased activity will improve ?Outcome: Adequate for Discharge ?  ?Problem: Clinical Measurements: ?Goal: Ability to maintain a body temperature in the normal range will improve ?Outcome: Adequate for Discharge ?  ?Problem: Respiratory: ?Goal: Ability to maintain adequate ventilation will improve ?Outcome: Adequate for Discharge ?Goal: Ability to maintain a clear airway will improve ?Outcome: Adequate for Discharge ?  ?

## 2022-02-06 NOTE — Care Management Obs Status (Signed)
MEDICARE OBSERVATION STATUS NOTIFICATION ? ? ?Patient Details  ?Name: Draedyn B Brinker ?MRN: [id number] ?Date of Birth: 03/22/1956 ? ? ?Medicare Observation Status Notification Given:    ? ? ? ?Hannahgrace Lalli J Savreen Gebhardt, RN ?02/06/2022, 1:02 PM ?

## 2022-02-07 ENCOUNTER — Encounter: Payer: Self-pay | Admitting: Emergency Medicine

## 2022-02-07 ENCOUNTER — Inpatient Hospital Stay
Admission: EM | Admit: 2022-02-07 | Discharge: 2022-02-09 | DRG: 190 | Disposition: A | Discharge status: Home / Self Care | Payer: Medicare HMO | Attending: Internal Medicine | Admitting: Internal Medicine

## 2022-02-07 ENCOUNTER — Other Ambulatory Visit: Payer: Self-pay

## 2022-02-07 ENCOUNTER — Emergency Department: Payer: Medicare HMO

## 2022-02-07 DIAGNOSIS — K920 Hematemesis: Secondary | ICD-10-CM | POA: Diagnosis present

## 2022-02-07 DIAGNOSIS — Z7982 Long term (current) use of aspirin: Secondary | ICD-10-CM

## 2022-02-07 DIAGNOSIS — Z87891 Personal history of nicotine dependence: Secondary | ICD-10-CM

## 2022-02-07 DIAGNOSIS — E785 Hyperlipidemia, unspecified: Secondary | ICD-10-CM | POA: Diagnosis present

## 2022-02-07 DIAGNOSIS — F330 Major depressive disorder, recurrent, mild: Secondary | ICD-10-CM | POA: Diagnosis not present

## 2022-02-07 DIAGNOSIS — J9600 Acute respiratory failure, unspecified whether with hypoxia or hypercapnia: Secondary | ICD-10-CM | POA: Diagnosis present

## 2022-02-07 DIAGNOSIS — Z20822 Contact with and (suspected) exposure to covid-19: Secondary | ICD-10-CM | POA: Diagnosis present

## 2022-02-07 DIAGNOSIS — K760 Fatty (change of) liver, not elsewhere classified: Secondary | ICD-10-CM | POA: Diagnosis present

## 2022-02-07 DIAGNOSIS — E871 Hypo-osmolality and hyponatremia: Secondary | ICD-10-CM | POA: Diagnosis present

## 2022-02-07 DIAGNOSIS — R0902 Hypoxemia: Secondary | ICD-10-CM

## 2022-02-07 DIAGNOSIS — R7303 Prediabetes: Secondary | ICD-10-CM | POA: Diagnosis present

## 2022-02-07 DIAGNOSIS — J9601 Acute respiratory failure with hypoxia: Secondary | ICD-10-CM | POA: Diagnosis not present

## 2022-02-07 DIAGNOSIS — Z888 Allergy status to other drugs, medicaments and biological substances status: Secondary | ICD-10-CM

## 2022-02-07 DIAGNOSIS — G4733 Obstructive sleep apnea (adult) (pediatric): Secondary | ICD-10-CM | POA: Diagnosis present

## 2022-02-07 DIAGNOSIS — J204 Acute bronchitis due to parainfluenza virus: Secondary | ICD-10-CM | POA: Diagnosis present

## 2022-02-07 DIAGNOSIS — J690 Pneumonitis due to inhalation of food and vomit: Secondary | ICD-10-CM | POA: Diagnosis present

## 2022-02-07 DIAGNOSIS — Z6836 Body mass index (BMI) 36.0-36.9, adult: Secondary | ICD-10-CM

## 2022-02-07 DIAGNOSIS — M351 Other overlap syndromes: Secondary | ICD-10-CM | POA: Diagnosis present

## 2022-02-07 DIAGNOSIS — R0781 Pleurodynia: Principal | ICD-10-CM

## 2022-02-07 DIAGNOSIS — E669 Obesity, unspecified: Secondary | ICD-10-CM | POA: Diagnosis present

## 2022-02-07 DIAGNOSIS — J9691 Respiratory failure, unspecified with hypoxia: Secondary | ICD-10-CM | POA: Diagnosis present

## 2022-02-07 DIAGNOSIS — J441 Chronic obstructive pulmonary disease with (acute) exacerbation: Principal | ICD-10-CM | POA: Diagnosis present

## 2022-02-07 DIAGNOSIS — I2510 Atherosclerotic heart disease of native coronary artery without angina pectoris: Secondary | ICD-10-CM | POA: Diagnosis present

## 2022-02-07 DIAGNOSIS — I700 Atherosclerosis of aorta: Secondary | ICD-10-CM | POA: Diagnosis present

## 2022-02-07 DIAGNOSIS — R0789 Other chest pain: Secondary | ICD-10-CM

## 2022-02-07 DIAGNOSIS — Z79899 Other long term (current) drug therapy: Secondary | ICD-10-CM

## 2022-02-07 LAB — CBC
HCT: 42.3 % (ref 39.0–52.0)
Hemoglobin: 14 g/dL (ref 13.0–17.0)
MCH: 32.4 pg (ref 26.0–34.0)
MCHC: 33.1 g/dL (ref 30.0–36.0)
MCV: 97.9 fL (ref 80.0–100.0)
Platelets: 293 10*3/uL (ref 150–400)
RBC: 4.32 MIL/uL (ref 4.22–5.81)
RDW: 13.4 % (ref 11.5–15.5)
WBC: 16.2 10*3/uL — ABNORMAL HIGH (ref 4.0–10.5)
nRBC: 0.2 % (ref 0.0–0.2)

## 2022-02-07 LAB — BASIC METABOLIC PANEL
Anion gap: 6 (ref 5–15)
BUN: 19 mg/dL (ref 8–23)
CO2: 20 mmol/L — ABNORMAL LOW (ref 22–32)
Calcium: 8.3 mg/dL — ABNORMAL LOW (ref 8.9–10.3)
Chloride: 105 mmol/L (ref 98–111)
Creatinine, Ser: 1.13 mg/dL (ref 0.61–1.24)
GFR, Estimated: >60 mL/min (ref >60)
Glucose, Bld: 117 mg/dL — ABNORMAL HIGH (ref 70–99)
Potassium: 3.9 mmol/L (ref 3.5–5.1)
Sodium: 131 mmol/L — ABNORMAL LOW (ref 135–145)

## 2022-02-07 LAB — TROPONIN I (HIGH SENSITIVITY)
Troponin I (High Sensitivity): 6 ng/L (ref <18)
Troponin I (High Sensitivity): 5 ng/L (ref <18)

## 2022-02-07 MED ORDER — TRAMADOL HCL 50 MG PO TABS
50.0000 mg | ORAL_TABLET | Freq: Once | ORAL | Status: AC
  Administered 2022-02-07: 50 mg via ORAL
  Filled 2022-02-07: qty 1

## 2022-02-07 MED ORDER — LIDOCAINE 5 % EX PTCH
1.0000 | MEDICATED_PATCH | CUTANEOUS | Status: DC
  Administered 2022-02-07 – 2022-02-08 (×2): 1 via TRANSDERMAL
  Filled 2022-02-07 (×3): qty 1

## 2022-02-07 MED ORDER — SODIUM CHLORIDE 0.9% FLUSH
3.0000 mL | Freq: Two times a day (BID) | INTRAVENOUS | Status: DC
  Administered 2022-02-07 – 2022-02-09 (×5): 3 mL via INTRAVENOUS

## 2022-02-07 MED ORDER — IPRATROPIUM-ALBUTEROL 0.5-2.5 (3) MG/3ML IN SOLN
3.0000 mL | Freq: Once | RESPIRATORY_TRACT | Status: AC
  Administered 2022-02-07: 3 mL via RESPIRATORY_TRACT
  Filled 2022-02-07: qty 3

## 2022-02-07 MED ORDER — ESCITALOPRAM OXALATE 10 MG PO TABS
20.0000 mg | ORAL_TABLET | Freq: Every day | ORAL | Status: DC
  Administered 2022-02-08 – 2022-02-09 (×2): 20 mg via ORAL
  Filled 2022-02-07 (×2): qty 2

## 2022-02-07 MED ORDER — ONDANSETRON HCL 4 MG PO TABS: 4.0000 mg | ORAL_TABLET | Freq: Four times a day (QID) | ORAL | Status: DC | PRN

## 2022-02-07 MED ORDER — HYDROCOD POLI-CHLORPHE POLI ER 10-8 MG/5ML PO SUER
5.0000 mL | Freq: Two times a day (BID) | ORAL | Status: DC | PRN
  Administered 2022-02-07 – 2022-02-09 (×4): 5 mL via ORAL
  Filled 2022-02-07 (×4): qty 5

## 2022-02-07 MED ORDER — SODIUM CHLORIDE 0.9% FLUSH: 3.0000 mL | INTRAVENOUS | Status: DC | PRN

## 2022-02-07 MED ORDER — PANTOPRAZOLE SODIUM 40 MG PO TBEC
40.0000 mg | DELAYED_RELEASE_TABLET | Freq: Every day | ORAL | Status: DC
Start: 2022-02-07 — End: 2022-02-09
  Administered 2022-02-07 – 2022-02-09 (×3): 40 mg via ORAL
  Filled 2022-02-07 (×3): qty 1

## 2022-02-07 MED ORDER — OLANZAPINE 2.5 MG PO TABS
1.2500 mg | ORAL_TABLET | Freq: Every day | ORAL | Status: DC
  Administered 2022-02-07 – 2022-02-09 (×3): 1.25 mg via ORAL
  Filled 2022-02-07 (×3): qty 0.5

## 2022-02-07 MED ORDER — EZETIMIBE 10 MG PO TABS
10.0000 mg | ORAL_TABLET | Freq: Every day | ORAL | Status: DC
  Administered 2022-02-08 – 2022-02-09 (×2): 10 mg via ORAL
  Filled 2022-02-07 (×2): qty 1

## 2022-02-07 MED ORDER — AMOXICILLIN-POT CLAVULANATE 875-125 MG PO TABS
1.0000 | ORAL_TABLET | Freq: Two times a day (BID) | ORAL | Status: DC
  Administered 2022-02-07 – 2022-02-09 (×4): 1 via ORAL
  Filled 2022-02-07 (×4): qty 1

## 2022-02-07 MED ORDER — KETOROLAC TROMETHAMINE 15 MG/ML IJ SOLN
15.0000 mg | Freq: Four times a day (QID) | INTRAMUSCULAR | Status: AC | PRN
  Administered 2022-02-07 – 2022-02-08 (×5): 15 mg via INTRAVENOUS
  Filled 2022-02-07 (×5): qty 1

## 2022-02-07 MED ORDER — LIDOCAINE 5 % EX PTCH: 1.0000 | MEDICATED_PATCH | CUTANEOUS | Status: DC

## 2022-02-07 MED ORDER — KETOROLAC TROMETHAMINE 30 MG/ML IJ SOLN
15.0000 mg | Freq: Once | INTRAMUSCULAR | Status: AC
Start: 2022-02-07 — End: 2022-02-07
  Administered 2022-02-07: 15 mg via INTRAVENOUS
  Filled 2022-02-07: qty 1

## 2022-02-07 MED ORDER — DONEPEZIL HCL 5 MG PO TABS
5.0000 mg | ORAL_TABLET | Freq: Every day | ORAL | Status: DC
  Administered 2022-02-08 – 2022-02-09 (×2): 5 mg via ORAL
  Filled 2022-02-07 (×2): qty 1

## 2022-02-07 MED ORDER — ASPIRIN EC 81 MG PO TBEC
81.0000 mg | DELAYED_RELEASE_TABLET | Freq: Every day | ORAL | Status: DC
  Administered 2022-02-08 – 2022-02-09 (×2): 81 mg via ORAL
  Filled 2022-02-07 (×2): qty 1

## 2022-02-07 MED ORDER — ONDANSETRON HCL 4 MG/2ML IJ SOLN
4.0000 mg | Freq: Four times a day (QID) | INTRAMUSCULAR | Status: DC | PRN
Start: 2022-02-07 — End: 2022-02-09

## 2022-02-07 MED ORDER — ATENOLOL 25 MG PO TABS
25.0000 mg | ORAL_TABLET | Freq: Every day | ORAL | Status: DC
  Administered 2022-02-09: 25 mg via ORAL
  Filled 2022-02-07 (×3): qty 1

## 2022-02-07 MED ORDER — PREDNISONE 20 MG PO TABS
40.0000 mg | ORAL_TABLET | Freq: Every day | ORAL | Status: DC
Start: 2022-02-08 — End: 2022-02-09
  Administered 2022-02-08 – 2022-02-09 (×2): 40 mg via ORAL
  Filled 2022-02-07 (×2): qty 2

## 2022-02-07 MED ORDER — ATORVASTATIN CALCIUM 20 MG PO TABS
40.0000 mg | ORAL_TABLET | Freq: Every day | ORAL | Status: DC
  Administered 2022-02-08 – 2022-02-09 (×2): 40 mg via ORAL
  Filled 2022-02-07 (×2): qty 2

## 2022-02-07 MED ORDER — ALBUTEROL SULFATE (2.5 MG/3ML) 0.083% IN NEBU
3.0000 mL | INHALATION_SOLUTION | RESPIRATORY_TRACT | Status: DC | PRN
  Administered 2022-02-07 – 2022-02-08 (×2): 3 mL via RESPIRATORY_TRACT
  Filled 2022-02-07 (×2): qty 3

## 2022-02-07 MED ORDER — LAMOTRIGINE 100 MG PO TABS
200.0000 mg | ORAL_TABLET | Freq: Every day | ORAL | Status: DC
Start: 2022-02-07 — End: 2022-02-09
  Administered 2022-02-08 – 2022-02-09 (×2): 200 mg via ORAL
  Filled 2022-02-07 (×3): qty 2

## 2022-02-07 MED ORDER — MOMETASONE FURO-FORMOTEROL FUM 200-5 MCG/ACT IN AERO
2.0000 | INHALATION_SPRAY | Freq: Two times a day (BID) | RESPIRATORY_TRACT | Status: DC
  Administered 2022-02-07 – 2022-02-09 (×4): 2 via RESPIRATORY_TRACT
  Filled 2022-02-07: qty 8.8

## 2022-02-07 MED ORDER — HYDROCOD POLI-CHLORPHE POLI ER 10-8 MG/5ML PO SUER
5.0000 mL | Freq: Once | ORAL | Status: AC
  Administered 2022-02-07: 5 mL via ORAL
  Filled 2022-02-07: qty 5

## 2022-02-07 MED ORDER — SODIUM CHLORIDE 0.9 % IV SOLN: 250.0000 mL | INTRAVENOUS | Status: DC | PRN

## 2022-02-07 MED ORDER — HYDROCOD POLI-CHLORPHE POLI ER 10-8 MG/5ML PO SUER: 5.0000 mL | Freq: Once | ORAL | Status: DC

## 2022-02-07 NOTE — H&P (Signed)
?History and Physical  ? ? ?Patient: Jesus Mckinney:[id number] DOB: 01/04/1956 ?DOA: 02/07/2022 ?DOS: the patient was seen and examined on 02/07/2022 ?PCP: [facility] [place]  ?Patient coming from: Home ? ?Chief Complaint:  ?Chief Complaint  ?Patient presents with  ? Shortness of Breath  ? ?HPI: Jesus Mckinney is a 66 y.o. male with medical history significant for COPD, depression, obesity who was recently discharged from the hospital after treatment for pneumonia. ?He returns to the emergency room within 24 hours for evaluation of worsening right lateral chest wall pain when he coughs.  Pain is pleuritic in nature.  He also complains of shortness of breath with exertion.  During his hospitalization he said his cough had improved and pain was improved upon discharge but got worse since he went home. ?While in the ER he was noted to have room air pulse oximetry between 87 to 89% when coughing and is currently on 3 L of oxygen to maintain pulse oximetry greater than 92%. ?During his previous hospital visit he had a CT angiogram for evaluation of this right-sided pleuritic chest wall pain which was negative for PE.  He also had rib series done which did not show evidence of a rib fracture. ?His respiratory viral panel was positive for parainfluenza virus ?He denies having any abdominal pain, no nausea, no vomiting, no headache, no fever, no chills, no dizziness, no lightheadedness, no blurred vision or any focal deficit. ?Patient reports improvement in his pain with Toradol and lidocaine patch and will be referred to observation status for further evaluation. ? ?Review of Systems: As mentioned in the history of present illness. All other systems reviewed and are negative. ?Past Medical History:  ?Diagnosis Date  ? COPD (chronic obstructive pulmonary disease) (HCC)   ? Emphysema lung (HCC)   ? ?Past Surgical History:  ?Procedure Laterality Date  ? JOINT REPLACEMENT    ? ?Social History:  reports that he  has quit smoking. His smoking use included cigarettes. He has never used smokeless tobacco. He reports that he does not currently use alcohol. He reports that he does not currently use drugs. ? ?Allergies  ?Allergen Reactions  ? Dust Mite Extract Cough  ? Gramineae Pollens Cough  ? ? ?History reviewed. No pertinent family history. ? ?Prior to Admission medications   ?Medication Sig Start Date End Date Taking? Authorizing Provider  ?albuterol (ACCUNEB) 1.25 MG/3ML nebulizer solution Take 3 mLs (1.25 mg total) by nebulization every 4 (four) hours as needed for wheezing or shortness of breath. 02/02/22 02/02/23 Yes Summers, Rhonda L, PA-C  ?albuterol (VENTOLIN HFA) 108 (90 Base) MCG/ACT inhaler Inhale 1-2 puffs into the lungs every 6 (six) hours as needed. 08/13/21  Yes [provider]  ?amoxicillin-clavulanate (AUGMENTIN) 875-125 MG tablet Take 1 tablet by mouth 2 (two) times daily for 6 days. 02/06/22 02/12/22 Yes Lewie Chamber, MD  ?aspirin 81 MG EC tablet Take 81 mg by mouth daily.   Yes [provider]  ?atenolol (TENORMIN) 25 MG tablet Take 25 mg by mouth daily. 01/30/22  Yes [provider]  ?atorvastatin (LIPITOR) 40 MG tablet Take 40 mg by mouth daily. 06/19/21  Yes [provider]  ?benzonatate (TESSALON PERLES) 100 MG capsule Take 1 capsule (100 mg total) by mouth 3 (three) times daily as needed for cough. 02/03/22 02/03/23 Yes Sharman Cheek, MD  ?chlorpheniramine-HYDROcodone [facility]) 10-8 MG/5ML Take 5 mLs by mouth every 12 (twelve) hours as needed for cough. 02/02/22  Yes Bridget Hartshorn  L, PA-C  ?escitalopram (LEXAPRO) 20 MG tablet Take 20 mg by mouth daily. 06/10/21  Yes [provider]  ?ezetimibe (ZETIA) 10 MG tablet Take 10 mg by mouth daily. 01/11/22  Yes [provider]  ?fluticasone-salmeterol (ADVAIR HFA) 230-21 MCG/ACT inhaler Inhale 2 puffs into the lungs 2 (two) times daily. 08/22/20  Yes [provider]  ?guaiFENesin  (MUCINEX) 600 MG 12 hr tablet Take 1 tablet (600 mg total) by mouth 2 (two) times daily for 15 days. 02/03/22 02/18/22 Yes Sharman CheekStafford, Phillip, MD  ?ibuprofen (ADVIL) 200 MG tablet Take 2 tablets (400 mg total) by mouth every 8 (eight) hours as needed for mild pain or moderate pain. 02/06/22  Yes Lewie ChamberGirguis, David, MD  ?lamoTRIgine (LAMICTAL) 200 MG tablet Take 200 mg by mouth daily. 06/04/21  Yes [provider]  ?OLANZapine (ZYPREXA) 2.5 MG tablet Take 1.25 mg by mouth daily. 06/10/21  Yes [provider]  ?omeprazole (PRILOSEC) 40 MG capsule Take 40 mg by mouth daily. 06/20/21  Yes [provider]  ?predniSONE (DELTASONE) 20 MG tablet Take 2 tablets (40 mg total) by mouth daily with breakfast for 5 days. 02/07/22 02/12/22 Yes Lewie ChamberGirguis, David, MD  ?donepezil (ARICEPT) 5 MG tablet Take 5 mg by mouth daily. 01/27/22   [provider]  ?ipratropium-albuterol (DUONEB) 0.5-2.5 (3) MG/3ML SOLN Take 3 mLs by nebulization every 6 (six) hours as needed. 02/06/22   Lewie ChamberGirguis, David, MD  ? ? ?Physical Exam: ?Vitals:  ? 02/07/22 1330 02/07/22 1345 02/07/22 1400 02/07/22 1415  ?BP:      ?Pulse: 82 (!) 58 60 (!) 57  ?Resp:      ?Temp:      ?TempSrc:      ?SpO2: 90% (!) 89% 91% 91%  ?Weight:      ?Height:      ?Physical Exam ?Vitals and nursing note reviewed.  ?Constitutional:   ?   Appearance: He is obese.  ?HENT:  ?   Head: Normocephalic and atraumatic.  ?   Mouth/Throat:  ?   Mouth: Mucous membranes are moist.  ?Eyes:  ?   Pupils: Pupils are equal, round, and reactive to light.  ?Cardiovascular:  ?   Rate and Rhythm: Normal rate and regular rhythm.  ?Pulmonary:  ?   Breath sounds: Examination of the right-lower field reveals wheezing. Examination of the left-lower field reveals wheezing. Wheezing present.  ?   Comments: Splinting with respiration due to pain ?Abdominal:  ?   General: Bowel sounds are normal.  ?   Palpations: Abdomen is soft.  ?   Comments: Central adiposity  ?Musculoskeletal:     ?   General:  Normal range of motion.  ?   Cervical back: Normal range of motion and neck supple.  ?Skin: ?   General: Skin is warm and dry.  ?Neurological:  ?   General: No focal deficit present.  ?   Mental Status: He is alert.  ?Psychiatric:     ?   Mood and Affect: Mood normal.     ?   Behavior: Behavior normal.  ? ? ?Data Reviewed: ?Relevant notes from primary care and specialist visits, past discharge summaries as available in EHR, including Care Everywhere. ?Prior diagnostic testing as pertinent to current admission diagnoses ?Updated medications and problem lists for reconciliation ?ED course, including vitals, labs, imaging, treatment and response to treatment ?Triage notes, nursing and pharmacy notes and ED provider's notes ?Notable results as noted in HPI ?Labs reviewed.  Sodium 131, potassium 3.9,  chloride 105, bicarb 20, glucose 117, BUN 19, creatinine 1.13, calcium 8.3, white count 16.2, hemoglobin 14.0, hematocrit 42.3, platelet count 293 ?CXR reviewed by me showed Minimal bibasilar subsegmental atelectasis. ?Twelve-lead EKG reviewed by me shows sinus rhythm with incomplete right bundle branch block and low voltage QRS ?There are no new results to review at this time. ? ?Assessment and Plan: ?* Acute respiratory failure (HCC) ?Patient presents to the ER for evaluation of worsening right-sided pleuritic chest pain ?He was recently hospitalized for parainfluenza bronchitis ?On room air at rest he had pulse oximetry between 87 and 89% and is currently on 3 L of oxygen with pulse oximetry of 92%. ?Patient is splinting with inspiration due to pain which can account for his hypoxia ?Continue oxygen supplementation to maintain pulse oximetry greater than 92% ?Patient will need to be assessed for home oxygen need prior to discharge ? ? ?Parainfluenza virus bronchitis ?Patient recently admitted to the hospital for evaluation of pleuritic chest pain and shortness of breath ?Respiratory viral panel was positive for  parainfluenza ?Supportive care with bronchodilator therapy, systemic steroids and humidified oxygen as needed ? 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 ?Obesity (BMI 30-39.9) ?BMI 36.  All

## 2022-02-07 NOTE — Assessment & Plan Note (Signed)
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

## 2022-02-07 NOTE — ED Notes (Signed)
Informed RN bed assigned 

## 2022-02-07 NOTE — Assessment & Plan Note (Signed)
BMI 36.  All6 kg/m2 ?Complicates overall prognosis and care ?Lifestyle modification and exercise has been discussed with patient in detail ?

## 2022-02-07 NOTE — Assessment & Plan Note (Signed)
Patient recently admitted to the hospital for evaluation of pleuritic chest pain and shortness of breath ?Respiratory viral panel was positive for parainfluenza ?Supportive care with bronchodilator therapy, systemic steroids and humidified oxygen as needed ?

## 2022-02-07 NOTE — ED Triage Notes (Signed)
Pt comes into the ED via POV c/o SHOB.  Pt states he has emphysema and asthma.  Pt states this is his 4th time being here since Sunday due to pneumonia.  Pt was released yesterday, but he states the pain is getting worse as well as the [facility].  Pt currently has even and unlabored respirations and is in NAD.  ?

## 2022-02-07 NOTE — Progress Notes (Signed)
Cross Cover ?Dose pf tramadol ordered for unrelieved flank pain 9/10 that is aggravated with his coughing ?

## 2022-02-07 NOTE — ED Provider Notes (Signed)
? ?[facility] ?Emergency Department Provider Note ? ?____________________________________________ ? ? Event Date/Time  ? First MD Initiated Contact with Patient 02/07/22 1127   ?  (approximate) ? ?I have reviewed the triage vital signs and the nursing notes. ? ? ?HISTORY ? ?Chief Complaint ?Shortness of Breath ? ? ?HPI ?Jesus Mckinney is a 66 y.o. male with a history of COPD, hyponatremia, MDD, and as listed below presents to the emergency department for treatment and evaluation of continued cough with right side rib pain not well managed with cough medication, percocet, or prednisone. Using albuterol inhaler triggers cough, which triggers pain and therefore he does not use the inhaler as prescribed. ? ?Past Medical History:  ?Diagnosis Date  ? COPD (chronic obstructive pulmonary disease) (HCC)   ? Emphysema lung (HCC)   ? ? ?Patient Active Problem List  ? Diagnosis Date Noted  ? COPD exacerbation (HCC) 02/06/2022  ? Parainfluenza virus bronchitis 02/06/2022  ? Pneumonia 02/05/2022  ? COPD with acute exacerbation (HCC) 02/05/2022  ? Obesity (BMI 30-39.9) 02/05/2022  ? Hyponatremia 02/05/2022  ? Persistent cough 04/19/2021  ? Stopped smoking with greater than 30 pack year history 04/19/2021  ? Arthropathy of cervical facet joint 04/16/2021  ? Dyslipidemia, goal LDL below 70 03/29/2021  ? Aortic atherosclerosis (HCC) 02/20/2021  ? Hepatic steatosis 02/20/2021  ? Renal cyst 01/11/2021  ? History of colon polyps 10/16/2020  ? 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 OSA (obstructive sleep apnea) 10/16/2020  ? Panlobular emphysema (HCC) 10/16/2020  ? Prediabetes 10/16/2020  ? Vertigo 10/16/2020  ? Weight gain 10/16/2020  ? ? ?Past Surgical History:  ?Procedure Laterality Date  ? JOINT REPLACEMENT    ? ? ?Prior to Admission medications   ?Medication Sig Start Date End Date Taking? Authorizing Provider  ?albuterol (ACCUNEB) 1.25 MG/3ML nebulizer solution Take 3 mLs (1.25 mg total) by  nebulization every 4 (four) hours as needed for wheezing or shortness of breath. 02/02/22 02/02/23  Tommi Rumps, PA-C  ?albuterol (VENTOLIN HFA) 108 (90 Base) MCG/ACT inhaler Inhale 1-2 puffs into the lungs every 6 (six) hours as needed. 08/13/21   [provider]  ?amoxicillin-clavulanate (AUGMENTIN) 875-125 MG tablet Take 1 tablet by mouth 2 (two) times daily for 6 days. 02/06/22 02/12/22  Lewie Chamber, MD  ?aspirin 81 MG EC tablet Take 81 mg by mouth daily.    [provider]  ?atenolol (TENORMIN) 25 MG tablet Take 25 mg by mouth daily. 01/30/22   [provider]  ?atorvastatin (LIPITOR) 40 MG tablet Take 40 mg by mouth daily. 06/19/21   [provider]  ?benzonatate (TESSALON PERLES) 100 MG capsule Take 1 capsule (100 mg total) by mouth 3 (three) times daily as needed for cough. 02/03/22 02/03/23  Sharman Cheek, MD  ?chlorpheniramine-HYDROcodone [facility] ER) 10-8 MG/5ML Take 5 mLs by mouth every 12 (twelve) hours as needed for cough. 02/02/22   Tommi Rumps, PA-C  ?donepezil (ARICEPT) 5 MG tablet Take 5 mg by mouth daily. 01/27/22   [provider]  ?escitalopram (LEXAPRO) 20 MG tablet Take 20 mg by mouth daily. 06/10/21   [provider]  ?ezetimibe (ZETIA) 10 MG tablet Take 10 mg by mouth daily. 01/11/22   [provider]  ?fluticasone-salmeterol (ADVAIR HFA) 230-21 MCG/ACT inhaler Inhale 2 puffs into the lungs 2 (two) times daily. 08/22/20   [provider]  ?guaiFENesin (MUCINEX) 600 MG 12 hr tablet Take 1 tablet (600 mg total) by mouth 2 (two)  times daily for 15 days. 02/03/22 02/18/22  Sharman CheekStafford, Phillip, MD  ?ibuprofen (ADVIL) 200 MG tablet Take 2 tablets (400 mg total) by mouth every 8 (eight) hours as needed for mild pain or moderate pain. 02/06/22   Lewie ChamberGirguis, David, MD  ?ipratropium-albuterol (DUONEB) 0.5-2.5 (3) MG/3ML SOLN Take 3 mLs by nebulization every 6 (six) hours as needed. 02/06/22   Lewie ChamberGirguis, David, MD  ?lamoTRIgine  (LAMICTAL) 200 MG tablet Take 200 mg by mouth daily. 06/04/21   [provider]  ?OLANZapine (ZYPREXA) 2.5 MG tablet Take 1.25 mg by mouth daily. 06/10/21   [provider]  ?omeprazole (PRILOSEC) 40 MG capsule Take 40 mg by mouth daily. 06/20/21   [provider]  ?predniSONE (DELTASONE) 20 MG tablet Take 2 tablets (40 mg total) by mouth daily with breakfast for 5 days. 02/07/22 02/12/22  Lewie ChamberGirguis, David, MD  ? ? ?Allergies ?Dust mite extract and Gramineae pollens ? ?History reviewed. No pertinent family history. ? ?Social History ?Social History  ? ?Tobacco Use  ? Smoking status: Former  ?  Types: Cigarettes  ? Smokeless tobacco: Never  ?Substance Use Topics  ? Alcohol use: Not Currently  ? Drug use: Not Currently  ? ? ?PHYSICAL EXAM: ? ?VITAL SIGNS: ?ED Triage Vitals  ?Enc Vitals Group  ?   BP 02/07/22 1122 (!) 163/58  ?   Pulse Rate 02/07/22 1122 61  ?   Resp 02/07/22 1122 18  ?   Temp 02/07/22 1122 97.9 ?F (36.6 ?C)  ?   Temp Source 02/07/22 1122 Oral  ?   SpO2 02/07/22 1122 93 %  ?   Weight 02/07/22 1116 220 lb 0.3 oz (99.8 kg)  ?   Height 02/07/22 1116 5\' 5"  (1.651 m)  ?   Head Circumference --   ?   Peak Flow --   ?   Pain Score 02/07/22 1115 5  ?   Pain Loc --   ?   Pain Edu? --   ?   Excl. in GC? --   ? ? ?Constitutional: Alert and oriented. Uncomfortable appearing and in no acute distress. Normal mental status. ?Eyes: Conjunctivae are normal. PERRL. ?Head: Atraumatic. ?Nose: No congestion/rhinnorhea. ?Mouth/Throat: Mucous membranes are moist.  Oropharynx non-erythematous. Tongue normal in size and color. ?Neck: No stridor. No carotid bruit appreciated on exam. ?Hematological/Lymphatic/Immunilogical: No cervical lymphadenopathy. ?Cardiovascular: Normal rate, regular rhythm. Grossly normal heart sounds.  Good peripheral circulation. ?Respiratory: Normal respiratory effort.  No retractions. Lungs diminished bilateral bases with scattered wheezing.Marland Kitchen. ?Musculoskeletal: No lower extremity  tenderness. No edema of extremities. ?Neurologic:  Normal speech and language. No gross focal neurologic deficits are appreciated. ?Skin:  Skin is warm, dry and intact. No rash noted. ?Psychiatric: Mood and affect are normal. Speech and behavior are normal. ? ?____________________________________________ ?  ?LABS ?(all labs ordered are listed, but only abnormal results are displayed) ? ?Labs Reviewed  ?CBC - Abnormal; Notable for the following components:  ?    Result Value  ? WBC 16.2 (*)   ? All other components within normal limits  ?BASIC METABOLIC PANEL - Abnormal; Notable for the following components:  ? Sodium 131 (*)   ? CO2 20 (*)   ? Glucose, Bld 117 (*)   ? Calcium 8.3 (*)   ? All other components within normal limits  ?TROPONIN I (HIGH SENSITIVITY)  ?TROPONIN I (HIGH SENSITIVITY)  ? ?____________________________________________ ? ?EKG ? ?ED ECG REPORT ?I, Kem Boroughsari Cloee Dunwoody, FNP-BC personally viewed and interpreted this ECG. ? ? Date:  02/07/2022 ? EKG Time: 1121 ? Rate: 59 ? Rhythm: normal sinus rhythm ? Axis: normal ? Intervals:none ? ST&T Change: no ? ?____________________________________________ ? ?RADIOLOGY ? ?ED MD interpretation: Minimal basilar subsegmental atelectasis. ? ?I, Kem Boroughs, personally viewed and evaluated these images (plain radiographs) as part of my medical decision making, as well as reviewing the written report by the radiologist. ? ?Official radiology report(s): ?DG Chest 2 View ? ?Result Date: 02/07/2022 ?CLINICAL DATA:  Shortness of breath. EXAM: CHEST - 2 VIEW COMPARISON:  February 05, 2022. FINDINGS: The heart size and mediastinal contours are within normal limits. Minimal bibasilar subsegmental atelectasis is noted. The visualized skeletal structures are unremarkable. IMPRESSION: Minimal bibasilar subsegmental atelectasis. Electronically Signed   By: Lupita Raider M.D.   On: 02/07/2022 12:27   ? ?____________________________________________ ? ? ?PROCEDURES ? ?Procedure(s)  performed: None ? ?Procedures ? ?Critical Care performed: No ? ?____________________________________________ ? ? ?Differential diagnosis includes, but not limited to: ? ?Differential includes, but is not limite

## 2022-02-07 NOTE — Assessment & Plan Note (Signed)
Stable ?Continue Lexapro and Zyprexa ?

## 2022-02-07 NOTE — Assessment & Plan Note (Signed)
Patient presents to the ER for evaluation of worsening right-sided pleuritic chest pain ?He was recently hospitalized for parainfluenza bronchitis ?On room air at rest he had pulse oximetry between 87 and 89% and is currently on 3 L of oxygen with pulse oximetry of 92%. ?Patient is splinting with inspiration due to pain which can account for his hypoxia ?Continue oxygen supplementation to maintain pulse oximetry greater than 92% ?Patient will need to be assessed for home oxygen need prior to discharge ? ?

## 2022-02-07 NOTE — ED Provider Notes (Signed)
Vitals:  ? 02/07/22 1122 02/07/22 1130  ?BP: (!) 163/58 (!) 167/71  ?Pulse: 61 63  ?Resp: 18 16  ?Temp: 97.9 ?F (36.6 ?C)   ?SpO2: 93% 92%  ? ? ? ? ?Medical screening examination/treatment/procedure(s) were conducted as a shared visit with non-physician practitioner(s) and myself.  I personally evaluated the patient during the encounter. ? ? ? ?Personally saw and evaluate the patient.  He is agreeable with plan for readmission due to severity of right-sided pleuritic pain and its been associated with his pneumonia.  Reports his pain has been present throughout his hospitalization as well.  Review of imaging did notes that he has had previous work-up including a CT angiogram that was negative for pulmonary embolism ? ?At this point, I suspect his pain is likely secondary to cough rib strain or perhaps a subacute rib fracture but I do not see evidence that this would be due to a acute pulmonary embolism.  He does have an oxygen requirement and oxygen saturation about 88% on 2 L, increased to 3 L nasal cannula. ? ?Plan to readmit.  Patient understanding.  Reports at the present time Toradol and lidocaine patch have been helpful, will also give a dose of Hycodan ?  ?Sharyn Creamer, MD ?02/07/22 1402 ? ?

## 2022-02-07 NOTE — ED Provider Notes (Signed)
Consulted with hospitalist Dr. Joylene Igo.  Given the patient's oxygen requirement and severity of pleuritic right-sided pain will readmit for further care and management. ?  Sharyn Creamer, MD ?02/07/22 1407 ? ?

## 2022-02-07 NOTE — ED Notes (Signed)
When pt. Has coughing fit, desat to 87-89%, PA aware. ?

## 2022-02-08 DIAGNOSIS — Z20822 Contact with and (suspected) exposure to covid-19: Secondary | ICD-10-CM | POA: Diagnosis present

## 2022-02-08 DIAGNOSIS — R7303 Prediabetes: Secondary | ICD-10-CM | POA: Diagnosis present

## 2022-02-08 DIAGNOSIS — E669 Obesity, unspecified: Secondary | ICD-10-CM | POA: Diagnosis present

## 2022-02-08 DIAGNOSIS — E785 Hyperlipidemia, unspecified: Secondary | ICD-10-CM | POA: Diagnosis present

## 2022-02-08 DIAGNOSIS — J9621 Acute and chronic respiratory failure with hypoxia: Secondary | ICD-10-CM

## 2022-02-08 DIAGNOSIS — G4733 Obstructive sleep apnea (adult) (pediatric): Secondary | ICD-10-CM | POA: Diagnosis present

## 2022-02-08 DIAGNOSIS — J441 Chronic obstructive pulmonary disease with (acute) exacerbation: Secondary | ICD-10-CM | POA: Diagnosis present

## 2022-02-08 DIAGNOSIS — J690 Pneumonitis due to inhalation of food and vomit: Secondary | ICD-10-CM | POA: Diagnosis present

## 2022-02-08 DIAGNOSIS — J9600 Acute respiratory failure, unspecified whether with hypoxia or hypercapnia: Secondary | ICD-10-CM | POA: Diagnosis present

## 2022-02-08 DIAGNOSIS — J9691 Respiratory failure, unspecified with hypoxia: Secondary | ICD-10-CM | POA: Diagnosis present

## 2022-02-08 DIAGNOSIS — J9601 Acute respiratory failure with hypoxia: Secondary | ICD-10-CM | POA: Diagnosis present

## 2022-02-08 DIAGNOSIS — I700 Atherosclerosis of aorta: Secondary | ICD-10-CM | POA: Diagnosis present

## 2022-02-08 DIAGNOSIS — Z79899 Other long term (current) drug therapy: Secondary | ICD-10-CM | POA: Diagnosis not present

## 2022-02-08 DIAGNOSIS — F330 Major depressive disorder, recurrent, mild: Secondary | ICD-10-CM | POA: Diagnosis present

## 2022-02-08 DIAGNOSIS — M351 Other overlap syndromes: Secondary | ICD-10-CM | POA: Diagnosis present

## 2022-02-08 DIAGNOSIS — Z87891 Personal history of nicotine dependence: Secondary | ICD-10-CM | POA: Diagnosis not present

## 2022-02-08 DIAGNOSIS — E871 Hypo-osmolality and hyponatremia: Secondary | ICD-10-CM | POA: Diagnosis present

## 2022-02-08 DIAGNOSIS — Z7982 Long term (current) use of aspirin: Secondary | ICD-10-CM | POA: Diagnosis not present

## 2022-02-08 DIAGNOSIS — K920 Hematemesis: Secondary | ICD-10-CM | POA: Diagnosis present

## 2022-02-08 DIAGNOSIS — Z888 Allergy status to other drugs, medicaments and biological substances status: Secondary | ICD-10-CM | POA: Diagnosis not present

## 2022-02-08 DIAGNOSIS — Z6836 Body mass index (BMI) 36.0-36.9, adult: Secondary | ICD-10-CM | POA: Diagnosis not present

## 2022-02-08 DIAGNOSIS — J204 Acute bronchitis due to parainfluenza virus: Secondary | ICD-10-CM | POA: Diagnosis present

## 2022-02-08 DIAGNOSIS — K760 Fatty (change of) liver, not elsewhere classified: Secondary | ICD-10-CM | POA: Diagnosis present

## 2022-02-08 DIAGNOSIS — I2510 Atherosclerotic heart disease of native coronary artery without angina pectoris: Secondary | ICD-10-CM | POA: Diagnosis present

## 2022-02-08 LAB — BASIC METABOLIC PANEL
Anion gap: 8 (ref 5–15)
BUN: 21 mg/dL (ref 8–23)
CO2: 21 mmol/L — ABNORMAL LOW (ref 22–32)
Calcium: 8.1 mg/dL — ABNORMAL LOW (ref 8.9–10.3)
Chloride: 103 mmol/L (ref 98–111)
Creatinine, Ser: 1.16 mg/dL (ref 0.61–1.24)
GFR, Estimated: >60 mL/min (ref >60)
Glucose, Bld: 81 mg/dL (ref 70–99)
Potassium: 3.5 mmol/L (ref 3.5–5.1)
Sodium: 132 mmol/L — ABNORMAL LOW (ref 135–145)

## 2022-02-08 LAB — CBC
HCT: 38.3 % — ABNORMAL LOW (ref 39.0–52.0)
Hemoglobin: 13 g/dL (ref 13.0–17.0)
MCH: 32.7 pg (ref 26.0–34.0)
MCHC: 33.9 g/dL (ref 30.0–36.0)
MCV: 96.2 fL (ref 80.0–100.0)
Platelets: 260 10*3/uL (ref 150–400)
RBC: 3.98 MIL/uL — ABNORMAL LOW (ref 4.22–5.81)
RDW: 13.1 % (ref 11.5–15.5)
WBC: 13.4 10*3/uL — ABNORMAL HIGH (ref 4.0–10.5)
nRBC: 0 % (ref 0.0–0.2)

## 2022-02-08 MED ORDER — ALBUTEROL SULFATE (2.5 MG/3ML) 0.083% IN NEBU
3.0000 mL | INHALATION_SOLUTION | RESPIRATORY_TRACT | Status: DC | PRN
  Administered 2022-02-09: 3 mL via RESPIRATORY_TRACT
  Filled 2022-02-08: qty 3

## 2022-02-08 MED ORDER — SODIUM CHLORIDE 3 % IN NEBU
4.0000 mL | INHALATION_SOLUTION | Freq: Two times a day (BID) | RESPIRATORY_TRACT | Status: DC
  Administered 2022-02-08 – 2022-02-09 (×3): 4 mL via RESPIRATORY_TRACT
  Filled 2022-02-08 (×4): qty 4

## 2022-02-08 MED ORDER — IPRATROPIUM-ALBUTEROL 0.5-2.5 (3) MG/3ML IN SOLN
3.0000 mL | RESPIRATORY_TRACT | Status: DC
  Administered 2022-02-08 – 2022-02-09 (×8): 3 mL via RESPIRATORY_TRACT
  Filled 2022-02-08 (×7): qty 3

## 2022-02-08 MED ORDER — ALBUTEROL SULFATE (2.5 MG/3ML) 0.083% IN NEBU: 3.0000 mL | INHALATION_SOLUTION | RESPIRATORY_TRACT | Status: DC

## 2022-02-08 NOTE — Progress Notes (Signed)
?PROGRESS NOTE ? ? ? ?Jesus MaskerWilliam B Cortina  ONG:[id number] DOB: Feb 04, 1956 DOA: 02/07/2022 ?[facility]  ? ?Brief Narrative:  ?PER HPI: ?Jesus Mckinney is a 66 y.o. male with medical history significant for COPD, depression, obesity who was recently discharged from the hospital after treatment for pneumonia. ?He returns to the emergency room within 24 hours for evaluation of worsening right lateral chest wall pain when he coughs.  Pain is pleuritic in nature.  He also complains of shortness of breath with exertion.  During his hospitalization he said his cough had improved and pain was improved upon discharge but got worse since he went home. ?While in the ER he was noted to have room air pulse oximetry between 87 to 89% when coughing and is currently on 3 L of oxygen to maintain pulse oximetry greater than 92%. ?During his previous hospital visit he had a CT angiogram for evaluation of this right-sided pleuritic chest wall pain which was negative for PE.  He also had rib series done which did not show evidence of a rib fracture. ?His respiratory viral panel was positive for parainfluenza virus ?He denies having any abdominal pain, no nausea, no vomiting, no headache, no fever, no chills, no dizziness, no lightheadedness, no blurred vision or any focal deficit. ?Patient reports improvement in his pain with Toradol and lidocaine patch and will be referred to observation status for further evaluation. ? ? ?Assessment & Plan: ?  ?Principal Problem: ?  Acute respiratory failure (HCC) ?Active Problems: ?  Parainfluenza virus bronchitis ?  Hyponatremia ?  Obesity (BMI 30-39.9) ?  Mild recurrent major depression (HCC) ?  Respiratory failure with hypoxia (HCC) ? ? ?* Acute respiratory failure (HCC) ?Patient presents to the ER for evaluation of worsening right-sided pleuritic chest pain ?He was recently hospitalized for parainfluenza bronchitis ?On room air at rest he had pulse oximetry between 87 and 89%  and is currently on 3 L of oxygen with pulse oximetry of 92%. ?Patient is splinting with inspiration due to pain which can account for his hypoxia ?Continue oxygen supplementation to maintain pulse oximetry greater than 92% ?Patient will need to be assessed for home oxygen need prior to discharge ?  ?  ?Parainfluenza virus bronchitis ?Patient recently admitted to the hospital for evaluation of pleuritic chest pain and shortness of breath ?Respiratory viral panel was positive for parainfluenza ?Supportive care with bronchodilator therapy, systemic steroids and humidified oxygen as needed ?  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  ?Obesity (BMI 30-39.9) ?BMI 36.  All6 kg/m2 ?Complicates overall prognosis and care ?Lifestyle modification and exercise has been discussed with patient in detail ?  ?Mild recurrent major depression (HCC) ?Stable ?Continue Lexapro and Zyprexa ? ?DVT prophylaxis: SCD/Compression stockings  ?Code Status: Full ? ?  ?Code Status Orders  ?(From admission, onward)  ?  ? ? ?  ? ?  Start     Ordered  ? 02/07/22 1504  Full code  Continuous       ? 02/07/22 1507  ? ?  ?  ? ?  ? ?Code Status History   ? ? Date Active Date Inactive Code Status Order ID Comments User Context  ? 02/05/2022 0902 02/06/2022 2231 Full Code [id number]  Lucile ShuttersAgbata, Tochukwu, MD ED  ? ?  ? ?Family Communication: none today  ?Disposition Plan:  pt will remain inpatient and he continues to require IV pain meds ? ?Consults called: None ?Admission status: Inpatient ? ? ?  Consultants:  ?None ? ?Procedures:  ?DG Chest 2 View ? ?Result Date: 02/07/2022 ?CLINICAL DATA:  Shortness of breath. EXAM: CHEST - 2 VIEW COMPARISON:  February 05, 2022. FINDINGS: The heart size and mediastinal contours are within normal limits. Minimal bibasilar subsegmental atelectasis is noted. The visualized skeletal structures are unremarkable. IMPRESSION: Minimal bibasilar subsegmental atelectasis. Electronically Signed   By: Lupita Raider M.D.   On: 02/07/2022 12:27  ? ?DG Chest 2 View ? ?Result Date: 02/05/2022 ?CLINICAL DATA:  66 year old male with history of cough and shortness of breath. EXAM: CHEST - 2 VIEW COMPARISON:  Chest x-ray 02/03/2022. FINDINGS: Lung volumes are low. No consolidative airspace disease. No pleural effusions. No pneumothorax. No pulmonary nodule or mass noted. Pulmonary vasculature and the cardiomediastinal silhouette are within normal limits. IMPRESSION: 1. Low lung volumes without radiographic evidence of acute cardiopulmonary disease. Electronically Signed   By: Trudie Reed M.D.   On: 02/05/2022 05:57  ? ?DG Chest 2 View ? ?Result Date: 02/02/2022 ?CLINICAL DATA:  Cough and congestion for 4 days.  History of COPD. EXAM: CHEST - 2 VIEW COMPARISON:  09/30/2021. FINDINGS: Cardiac silhouette normal in size and configuration. Normal mediastinal and hilar contours. Mild reticular opacity at the left lung base consistent with scarring, stable. Lungs otherwise clear. No pleural effusion or pneumothorax. Skeletal structures are grossly intact. IMPRESSION: No active cardiopulmonary disease. Electronically Signed   By: Amie Portland M.D.   On: 02/02/2022 11:08  ? ?DG Ribs Unilateral Right ? ?Result Date: 02/05/2022 ?CLINICAL DATA:  Right rib pain EXAM: RIGHT RIBS - 2 VIEW COMPARISON:  None. FINDINGS: No fracture or other bone lesions are seen involving the ribs. Visualized lungs are clear. Cardiac and mediastinal contours within normal limits. IMPRESSION: No displaced rib fracture. Electronically Signed   By: Allegra Lai M.D.   On: 02/05/2022 09:27  ? ?CT Angio Chest PE W/Cm &/Or Wo Cm ? ?Result Date: 02/05/2022 ?CLINICAL DATA:  66 year old male with history of cough and shortness of breath since Saturday. Evaluate for pulmonary embolism. EXAM: CT ANGIOGRAPHY CHEST WITH CONTRAST TECHNIQUE: Multidetector CT imaging of the chest was performed using the standard protocol during bolus administration of intravenous contrast.  Multiplanar CT image reconstructions and MIPs were obtained to evaluate the vascular anatomy. RADIATION DOSE REDUCTION: This exam was performed according to the departmental dose-optimization program which includes automated exposure control, adjustment of the mA and/or kV according to patient size and/or use of iterative reconstruction technique. CONTRAST:  39mL OMNIPAQUE IOHEXOL 350 MG/ML SOLN COMPARISON:  Chest CT 02/13/2021. FINDINGS: Cardiovascular: There are no filling defects within the pulmonary arterial tree to suggest pulmonary embolism. Heart size is mildly enlarged. There is no significant pericardial fluid, thickening or pericardial calcification. There is aortic atherosclerosis, as well as atherosclerosis of the great vessels of the mediastinum and the coronary arteries, including calcified atherosclerotic plaque in the left main, left anterior descending, left circumflex and right coronary arteries. Mediastinum/Nodes: No pathologically enlarged mediastinal or hilar lymph nodes. Esophagus is unremarkable in appearance. No axillary lymphadenopathy. Lungs/Pleura: Patchy areas of ground-glass attenuation are noted in the lung bases bilaterally, most evident in the left lower lobe. This is favored to predominantly reflect areas of passive subsegmental atelectasis. In addition, in the left lower lobe (axial image 104 of series 5) there is a 1.6 x 0.9 cm nodular density. Lungs are otherwise clear. No pleural effusions. No pneumothorax. Upper Abdomen: Aortic atherosclerosis. Musculoskeletal: There are no aggressive appearing lytic or blastic lesions noted in the  visualized portions of the skeleton. Review of the MIP images confirms the above findings. IMPRESSION: 1. There is what appears to be passive subsegmental atelectasis in the lower lobes of the lungs bilaterally, favored to predominantly reflect areas of passive atelectasis, although sequela of mild aspiration could have a similar appearance. 2. In the  left lower lobe (axial image 104 of series 5) there is a 1.6 x 0.9 cm nodular density. In the acute setting, this could be of infectious or inflammatory etiology, however, underlying neoplasm is not excl

## 2022-02-09 DIAGNOSIS — F330 Major depressive disorder, recurrent, mild: Secondary | ICD-10-CM | POA: Diagnosis not present

## 2022-02-09 DIAGNOSIS — E669 Obesity, unspecified: Secondary | ICD-10-CM | POA: Diagnosis not present

## 2022-02-09 DIAGNOSIS — E871 Hypo-osmolality and hyponatremia: Secondary | ICD-10-CM | POA: Diagnosis not present

## 2022-02-09 DIAGNOSIS — J9601 Acute respiratory failure with hypoxia: Secondary | ICD-10-CM | POA: Diagnosis not present

## 2022-02-09 LAB — BASIC METABOLIC PANEL
Anion gap: 8 (ref 5–15)
BUN: 19 mg/dL (ref 8–23)
CO2: 21 mmol/L — ABNORMAL LOW (ref 22–32)
Calcium: 8.3 mg/dL — ABNORMAL LOW (ref 8.9–10.3)
Chloride: 102 mmol/L (ref 98–111)
Creatinine, Ser: 1.11 mg/dL (ref 0.61–1.24)
GFR, Estimated: >60 mL/min (ref >60)
Glucose, Bld: 99 mg/dL (ref 70–99)
Potassium: 3.2 mmol/L — ABNORMAL LOW (ref 3.5–5.1)
Sodium: 131 mmol/L — ABNORMAL LOW (ref 135–145)

## 2022-02-09 LAB — CBC
HCT: 42 % (ref 39.0–52.0)
Hemoglobin: 14.3 g/dL (ref 13.0–17.0)
MCH: 32.6 pg (ref 26.0–34.0)
MCHC: 34 g/dL (ref 30.0–36.0)
MCV: 95.9 fL (ref 80.0–100.0)
Platelets: 244 10*3/uL (ref 150–400)
RBC: 4.38 MIL/uL (ref 4.22–5.81)
RDW: 12.9 % (ref 11.5–15.5)
WBC: 13.6 10*3/uL — ABNORMAL HIGH (ref 4.0–10.5)
nRBC: 0 % (ref 0.0–0.2)

## 2022-02-09 MED ORDER — HYDROCOD POLI-CHLORPHE POLI ER 10-8 MG/5ML PO SUER: 5.0000 mL | Freq: Two times a day (BID) | ORAL | 0 refills | Status: DC | PRN

## 2022-02-09 MED ORDER — HYDROCODONE-ACETAMINOPHEN 5-325 MG PO TABS: 2.0000 | ORAL_TABLET | Freq: Four times a day (QID) | ORAL | 0 refills | Status: DC | PRN

## 2022-02-09 MED ORDER — KETOROLAC TROMETHAMINE 15 MG/ML IJ SOLN
15.0000 mg | Freq: Once | INTRAMUSCULAR | Status: AC
  Administered 2022-02-09: 15 mg via INTRAVENOUS
  Filled 2022-02-09: qty 1

## 2022-02-09 MED ORDER — ALBUTEROL SULFATE 1.25 MG/3ML IN NEBU: 1.0000 | INHALATION_SOLUTION | RESPIRATORY_TRACT | 1 refills | Status: DC | PRN

## 2022-02-09 NOTE — Discharge Summary (Signed)
Physician Discharge Summary  ?Jesus Mckinney DXI:[id number] DOB: 01-31-1956 DOA: 02/07/2022 ? ?PCP: [facility] ? ?Admit date: 02/07/2022 ?Discharge date: 02/09/2022 ? ?Admitted From: Inpatient ?Disposition: home ? ?Recommendations for Outpatient Follow-up:  ?Follow up with PCP in 1-2 weeks ?F/up with pulm appt this wed ? ?Home Health:No ?Equipment/Devices:no new equip  ?Discharge Condition:Stable ?CODE STATUS:Full code ?Diet recommendation: Regular healthy diet ? ?Brief/Interim Summary: ?On admission: ?Jesus Mckinney is a 66 y.o. male with medical history significant for COPD, depression, obesity who was recently discharged from the hospital after treatment for pneumonia. ?He returns to the emergency room within 24 hours for evaluation of worsening right lateral chest wall pain when he coughs.  Pain is pleuritic in nature.  He also complains of shortness of breath with exertion.  During his hospitalization he said his cough had improved and pain was improved upon discharge but got worse since he went home. ?While in the ER he was noted to have room air pulse oximetry between 87 to 89% when coughing and is currently on 3 L of oxygen to maintain pulse oximetry greater than 92%. ?During his previous hospital visit he had a CT angiogram for evaluation of this right-sided pleuritic chest wall pain which was negative for PE.  He also had rib series done which did not show evidence of a rib fracture. ?His respiratory viral panel was positive for parainfluenza virus ?He denies having any abdominal pain, no nausea, no vomiting, no headache, no fever, no chills, no dizziness, no lightheadedness, no blurred vision or any focal deficit. ?Patient reports improvement in his pain with Toradol and lidocaine patch and will be referred to observation status for further evaluation ? ?Hospital course: ?After admission patient was placed on oxygen which she was subsequently weaned off of.  His work-up is remained  negative he remains afebrile on room air with only complication was requiring IV pain medications.  He will be discharged on a short course of hydrocodone for pleuritic chest pain associated with patient's diagnosis of parainfluenza.   ? ?The remainder of patient's hospital stay was unremarkable.  We will continue his home medications and follow-up with his previously scheduled pulmonology appointment this Wednesday. ? ?Discharge Diagnoses:  ?Principal Problem: ?  Acute respiratory failure (HCC) ?Active Problems: ?  Parainfluenza virus bronchitis ?  Hyponatremia ?  Obesity (BMI 30-39.9) ?  Mild recurrent major depression (HCC) ?  Respiratory failure with hypoxia (HCC) ? ? ? ?Discharge Instructions ? ?Discharge Instructions   ? ? Call MD for:   Complete by: As directed ?  ? For any acute change in medical condition  ? Diet - low sodium heart healthy   Complete by: As directed ?  ? Increase activity slowly   Complete by: As directed ?  ? ?  ? ?Allergies as of 02/09/2022   ? ?   Reactions  ? Dust Mite Extract Cough  ? Gramineae Pollens Cough  ? ?  ? ?  ?Medication List  ?  ? ?TAKE these medications   ? ?Advair HFA 230-21 MCG/ACT inhaler ?Generic drug: fluticasone-salmeterol ?Inhale 2 puffs into the lungs 2 (two) times daily. ?  ?albuterol 108 (90 Base) MCG/ACT inhaler ?Commonly known as: VENTOLIN HFA ?Inhale 1-2 puffs into the lungs every 6 (six) hours as needed. ?  ?albuterol 1.25 MG/3ML nebulizer solution ?Commonly known as: ACCUNEB ?Take 3 mLs (1.25 mg total) by nebulization every 4 (four) hours as needed for wheezing or shortness of breath. ?  ?amoxicillin-clavulanate 875-125  MG tablet ?Commonly known as: Augmentin ?Take 1 tablet by mouth 2 (two) times daily for 6 days. ?  ?aspirin 81 MG EC tablet ?Take 81 mg by mouth daily. ?  ?atenolol 25 MG tablet ?Commonly known as: TENORMIN ?Take 25 mg by mouth daily. ?  ?atorvastatin 40 MG tablet ?Commonly known as: LIPITOR ?Take 40 mg by mouth daily. ?  ?benzonatate 100 MG  capsule ?Commonly known as: Lawyer ?Take 1 capsule (100 mg total) by mouth 3 (three) times daily as needed for cough. ?  ?chlorpheniramine-HYDROcodone 10-8 MG/5ML ?Commonly known as: Tussionex Pennkinetic ER ?Take 5 mLs by mouth every 12 (twelve) hours as needed for up to 7 days for cough. ?  ?donepezil 5 MG tablet ?Commonly known as: ARICEPT ?Take 5 mg by mouth daily. ?  ?escitalopram 20 MG tablet ?Commonly known as: LEXAPRO ?Take 20 mg by mouth daily. ?  ?ezetimibe 10 MG tablet ?Commonly known as: ZETIA ?Take 10 mg by mouth daily. ?  ?guaiFENesin 600 MG 12 hr tablet ?Commonly known as: Mucinex ?Take 1 tablet (600 mg total) by mouth 2 (two) times daily for 15 days. ?  ?HYDROcodone-acetaminophen 5-325 MG tablet ?Commonly known as: NORCO/VICODIN ?Take 2 tablets by mouth every 6 (six) hours as needed for moderate pain. ?  ?ibuprofen 200 MG tablet ?Commonly known as: ADVIL ?Take 2 tablets (400 mg total) by mouth every 8 (eight) hours as needed for mild pain or moderate pain. ?  ?ipratropium-albuterol 0.5-2.5 (3) MG/3ML Soln ?Commonly known as: DUONEB ?Take 3 mLs by nebulization every 6 (six) hours as needed. ?  ?lamoTRIgine 200 MG tablet ?Commonly known as: LAMICTAL ?Take 200 mg by mouth daily. ?  ?OLANZapine 2.5 MG tablet ?Commonly known as: ZYPREXA ?Take 1.25 mg by mouth daily. ?  ?omeprazole 40 MG capsule ?Commonly known as: PRILOSEC ?Take 40 mg by mouth daily. ?  ?predniSONE 20 MG tablet ?Commonly known as: DELTASONE ?Take 2 tablets (40 mg total) by mouth daily with breakfast for 5 days. ?  ? ?  ? ? ?Allergies  ?Allergen Reactions  ? Dust Mite Extract Cough  ? Gramineae Pollens Cough  ? ? ?Consultations: ?Pulmonology ? ? ?Procedures/Studies: ?DG Chest 2 View ? ?Result Date: 02/07/2022 ?CLINICAL DATA:  Shortness of breath. EXAM: CHEST - 2 VIEW COMPARISON:  February 05, 2022. FINDINGS: The heart size and mediastinal contours are within normal limits. Minimal bibasilar subsegmental atelectasis is noted. The  visualized skeletal structures are unremarkable. IMPRESSION: Minimal bibasilar subsegmental atelectasis. Electronically Signed   By: Lupita Raider M.D.   On: 02/07/2022 12:27  ? ?DG Chest 2 View ? ?Result Date: 02/05/2022 ?CLINICAL DATA:  66 year old male with history of cough and shortness of breath. EXAM: CHEST - 2 VIEW COMPARISON:  Chest x-ray 02/03/2022. FINDINGS: Lung volumes are low. No consolidative airspace disease. No pleural effusions. No pneumothorax. No pulmonary nodule or mass noted. Pulmonary vasculature and the cardiomediastinal silhouette are within normal limits. IMPRESSION: 1. Low lung volumes without radiographic evidence of acute cardiopulmonary disease. Electronically Signed   By: Trudie Reed M.D.   On: 02/05/2022 05:57  ? ?DG Chest 2 View ? ?Result Date: 02/02/2022 ?CLINICAL DATA:  Cough and congestion for 4 days.  History of COPD. EXAM: CHEST - 2 VIEW COMPARISON:  09/30/2021. FINDINGS: Cardiac silhouette normal in size and configuration. Normal mediastinal and hilar contours. Mild reticular opacity at the left lung base consistent with scarring, stable. Lungs otherwise clear. No pleural effusion or pneumothorax. Skeletal structures are grossly intact. IMPRESSION:  No active cardiopulmonary disease. Electronically Signed   By: Amie Portlandavid  Ormond M.D.   On: 02/02/2022 11:08  ? ?DG Ribs Unilateral Right ? ?Result Date: 02/05/2022 ?CLINICAL DATA:  Right rib pain EXAM: RIGHT RIBS - 2 VIEW COMPARISON:  None. FINDINGS: No fracture or other bone lesions are seen involving the ribs. Visualized lungs are clear. Cardiac and mediastinal contours within normal limits. IMPRESSION: No displaced rib fracture. Electronically Signed   By: Allegra LaiLeah  Strickland M.D.   On: 02/05/2022 09:27  ? ?CT Angio Chest PE W/Cm &/Or Wo Cm ? ?Result Date: 02/05/2022 ?CLINICAL DATA:  66 year old male with history of cough and shortness of breath since Saturday. Evaluate for pulmonary embolism. EXAM: CT ANGIOGRAPHY CHEST WITH CONTRAST  TECHNIQUE: Multidetector CT imaging of the chest was performed using the standard protocol during bolus administration of intravenous contrast. Multiplanar CT image reconstructions and MIPs were obtained to evaluate

## 2022-02-10 LAB — CULTURE, BLOOD (ROUTINE X 2)
Culture: NO GROWTH
Culture: NO GROWTH
Special Requests: ADEQUATE

## 2022-02-13 ENCOUNTER — Other Ambulatory Visit: Payer: Self-pay | Admitting: Pulmonary Disease

## 2022-02-13 DIAGNOSIS — J4550 Severe persistent asthma, uncomplicated: Secondary | ICD-10-CM

## 2022-02-15 ENCOUNTER — Other Ambulatory Visit: Payer: Self-pay

## 2022-02-15 ENCOUNTER — Inpatient Hospital Stay
Admission: EM | Admit: 2022-02-15 | Discharge: 2022-02-17 | DRG: 193 | Disposition: A | Discharge status: Home / Self Care | Payer: Medicare HMO | Attending: Internal Medicine | Admitting: Internal Medicine

## 2022-02-15 ENCOUNTER — Emergency Department: Payer: Medicare HMO

## 2022-02-15 DIAGNOSIS — E785 Hyperlipidemia, unspecified: Secondary | ICD-10-CM | POA: Diagnosis present

## 2022-02-15 DIAGNOSIS — D7589 Other specified diseases of blood and blood-forming organs: Secondary | ICD-10-CM | POA: Diagnosis present

## 2022-02-15 DIAGNOSIS — S2249XA Multiple fractures of ribs, unspecified side, initial encounter for closed fracture: Secondary | ICD-10-CM

## 2022-02-15 DIAGNOSIS — J189 Pneumonia, unspecified organism: Secondary | ICD-10-CM | POA: Diagnosis present

## 2022-02-15 DIAGNOSIS — Z7982 Long term (current) use of aspirin: Secondary | ICD-10-CM | POA: Diagnosis not present

## 2022-02-15 DIAGNOSIS — Z79899 Other long term (current) drug therapy: Secondary | ICD-10-CM

## 2022-02-15 DIAGNOSIS — I10 Essential (primary) hypertension: Secondary | ICD-10-CM | POA: Diagnosis present

## 2022-02-15 DIAGNOSIS — Z8249 Family history of ischemic heart disease and other diseases of the circulatory system: Secondary | ICD-10-CM | POA: Diagnosis not present

## 2022-02-15 DIAGNOSIS — A419 Sepsis, unspecified organism: Secondary | ICD-10-CM | POA: Diagnosis present

## 2022-02-15 DIAGNOSIS — M810 Age-related osteoporosis without current pathological fracture: Secondary | ICD-10-CM | POA: Diagnosis present

## 2022-02-15 DIAGNOSIS — F329 Major depressive disorder, single episode, unspecified: Secondary | ICD-10-CM | POA: Diagnosis present

## 2022-02-15 DIAGNOSIS — S2241XA Multiple fractures of ribs, right side, initial encounter for closed fracture: Secondary | ICD-10-CM | POA: Diagnosis present

## 2022-02-15 DIAGNOSIS — Z7951 Long term (current) use of inhaled steroids: Secondary | ICD-10-CM

## 2022-02-15 DIAGNOSIS — Z87891 Personal history of nicotine dependence: Secondary | ICD-10-CM

## 2022-02-15 DIAGNOSIS — J439 Emphysema, unspecified: Secondary | ICD-10-CM | POA: Diagnosis present

## 2022-02-15 DIAGNOSIS — I4510 Unspecified right bundle-branch block: Secondary | ICD-10-CM | POA: Diagnosis present

## 2022-02-15 DIAGNOSIS — X58XXXA Exposure to other specified factors, initial encounter: Secondary | ICD-10-CM | POA: Diagnosis present

## 2022-02-15 DIAGNOSIS — K219 Gastro-esophageal reflux disease without esophagitis: Secondary | ICD-10-CM | POA: Diagnosis present

## 2022-02-15 DIAGNOSIS — R001 Bradycardia, unspecified: Secondary | ICD-10-CM | POA: Diagnosis present

## 2022-02-15 DIAGNOSIS — S301XXA Contusion of abdominal wall, initial encounter: Secondary | ICD-10-CM | POA: Diagnosis present

## 2022-02-15 DIAGNOSIS — J449 Chronic obstructive pulmonary disease, unspecified: Secondary | ICD-10-CM | POA: Diagnosis present

## 2022-02-15 DIAGNOSIS — Z9109 Other allergy status, other than to drugs and biological substances: Secondary | ICD-10-CM

## 2022-02-15 DIAGNOSIS — R0781 Pleurodynia: Secondary | ICD-10-CM | POA: Diagnosis not present

## 2022-02-15 DIAGNOSIS — S3011XA Contusion of abdominal wall, initial encounter: Secondary | ICD-10-CM | POA: Diagnosis present

## 2022-02-15 DIAGNOSIS — R0789 Other chest pain: Secondary | ICD-10-CM | POA: Diagnosis present

## 2022-02-15 LAB — LACTIC ACID, PLASMA: Lactic Acid, Venous: 1.2 mmol/L (ref 0.5–1.9)

## 2022-02-15 LAB — COMPREHENSIVE METABOLIC PANEL
ALT: 80 U/L — ABNORMAL HIGH (ref 0–44)
AST: 33 U/L (ref 15–41)
Albumin: 3.5 g/dL (ref 3.5–5.0)
Alkaline Phosphatase: 69 U/L (ref 38–126)
Anion gap: 7 (ref 5–15)
BUN: 16 mg/dL (ref 8–23)
CO2: 23 mmol/L (ref 22–32)
Calcium: 8.6 mg/dL — ABNORMAL LOW (ref 8.9–10.3)
Chloride: 106 mmol/L (ref 98–111)
Creatinine, Ser: 0.99 mg/dL (ref 0.61–1.24)
GFR, Estimated: >60 mL/min (ref >60)
Glucose, Bld: 104 mg/dL — ABNORMAL HIGH (ref 70–99)
Potassium: 4.1 mmol/L (ref 3.5–5.1)
Sodium: 136 mmol/L (ref 135–145)
Total Bilirubin: 0.7 mg/dL (ref 0.3–1.2)
Total Protein: 6.4 g/dL — ABNORMAL LOW (ref 6.5–8.1)

## 2022-02-15 LAB — CBC WITH DIFFERENTIAL/PLATELET
Abs Immature Granulocytes: 0.21 10*3/uL — ABNORMAL HIGH (ref 0.00–0.07)
Basophils Absolute: 0 10*3/uL (ref 0.0–0.1)
Basophils Relative: 0 %
Eosinophils Absolute: 0 10*3/uL (ref 0.0–0.5)
Eosinophils Relative: 0 %
HCT: 41.1 % (ref 39.0–52.0)
Hemoglobin: 13.5 g/dL (ref 13.0–17.0)
Immature Granulocytes: 1 %
Lymphocytes Relative: 8 %
Lymphs Abs: 1.3 10*3/uL (ref 0.7–4.0)
MCH: 33 pg (ref 26.0–34.0)
MCHC: 32.8 g/dL (ref 30.0–36.0)
MCV: 100.5 fL — ABNORMAL HIGH (ref 80.0–100.0)
Monocytes Absolute: 0.5 10*3/uL (ref 0.1–1.0)
Monocytes Relative: 3 %
Neutro Abs: 15.1 10*3/uL — ABNORMAL HIGH (ref 1.7–7.7)
Neutrophils Relative %: 88 %
Platelets: 245 10*3/uL (ref 150–400)
RBC: 4.09 MIL/uL — ABNORMAL LOW (ref 4.22–5.81)
RDW: 13.2 % (ref 11.5–15.5)
WBC: 17.1 10*3/uL — ABNORMAL HIGH (ref 4.0–10.5)
nRBC: 0 % (ref 0.0–0.2)

## 2022-02-15 LAB — LIPASE, BLOOD: Lipase: 38 U/L (ref 11–51)

## 2022-02-15 LAB — URINALYSIS, ROUTINE W REFLEX MICROSCOPIC
Bilirubin Urine: NEGATIVE
Glucose, UA: NEGATIVE mg/dL
Hgb urine dipstick: NEGATIVE
Ketones, ur: NEGATIVE mg/dL
Leukocytes,Ua: NEGATIVE
Nitrite: NEGATIVE
Protein, ur: NEGATIVE mg/dL
Specific Gravity, Urine: 1.005 (ref 1.005–1.030)
pH: 7 (ref 5.0–8.0)

## 2022-02-15 LAB — PROTIME-INR
INR: 0.9 (ref 0.8–1.2)
Prothrombin Time: 12 seconds (ref 11.4–15.2)

## 2022-02-15 LAB — TROPONIN I (HIGH SENSITIVITY)
Troponin I (High Sensitivity): 4 ng/L (ref <18)
Troponin I (High Sensitivity): 5 ng/L (ref <18)

## 2022-02-15 LAB — APTT: aPTT: 27 seconds (ref 24–36)

## 2022-02-15 MED ORDER — ONDANSETRON HCL 4 MG/2ML IJ SOLN: 4.0000 mg | Freq: Four times a day (QID) | INTRAMUSCULAR | Status: DC | PRN

## 2022-02-15 MED ORDER — MAGNESIUM HYDROXIDE 400 MG/5ML PO SUSP
30.0000 mL | Freq: Every day | ORAL | Status: DC | PRN
  Administered 2022-02-16: 30 mL via ORAL
  Filled 2022-02-15 (×2): qty 30

## 2022-02-15 MED ORDER — LIDOCAINE 5 % EX PTCH
1.0000 | MEDICATED_PATCH | CUTANEOUS | Status: DC
  Administered 2022-02-15 – 2022-02-16 (×2): 1 via TRANSDERMAL
  Filled 2022-02-15 (×3): qty 1

## 2022-02-15 MED ORDER — GUAIFENESIN ER 600 MG PO TB12
600.0000 mg | ORAL_TABLET | Freq: Two times a day (BID) | ORAL | Status: DC
  Administered 2022-02-15 – 2022-02-17 (×4): 600 mg via ORAL
  Filled 2022-02-15 (×4): qty 1

## 2022-02-15 MED ORDER — DONEPEZIL HCL 5 MG PO TABS
5.0000 mg | ORAL_TABLET | Freq: Every day | ORAL | Status: DC
Start: 2022-02-15 — End: 2022-02-15

## 2022-02-15 MED ORDER — ACETAMINOPHEN 650 MG RE SUPP: 650.0000 mg | Freq: Four times a day (QID) | RECTAL | Status: DC | PRN

## 2022-02-15 MED ORDER — SODIUM CHLORIDE 0.9 % IV SOLN: 2.0000 g | INTRAVENOUS | Status: DC

## 2022-02-15 MED ORDER — SODIUM CHLORIDE 0.9 % IV SOLN: 500.0000 mg | Freq: Once | INTRAVENOUS | Status: DC

## 2022-02-15 MED ORDER — PANTOPRAZOLE SODIUM 40 MG PO TBEC
40.0000 mg | DELAYED_RELEASE_TABLET | Freq: Every day | ORAL | Status: DC
  Administered 2022-02-16 – 2022-02-17 (×2): 40 mg via ORAL
  Filled 2022-02-15 (×2): qty 1

## 2022-02-15 MED ORDER — BENZONATATE 100 MG PO CAPS: 100.0000 mg | ORAL_CAPSULE | Freq: Three times a day (TID) | ORAL | Status: DC | PRN

## 2022-02-15 MED ORDER — HYDROCODONE-ACETAMINOPHEN 5-325 MG PO TABS
2.0000 | ORAL_TABLET | Freq: Four times a day (QID) | ORAL | Status: DC | PRN
  Administered 2022-02-16 – 2022-02-17 (×4): 2 via ORAL
  Filled 2022-02-15 (×5): qty 2

## 2022-02-15 MED ORDER — CEFTRIAXONE SODIUM 1 G IJ SOLR: 1.0000 g | Freq: Once | INTRAMUSCULAR | Status: DC

## 2022-02-15 MED ORDER — SODIUM CHLORIDE 0.9 % IV SOLN: INTRAVENOUS | Status: DC

## 2022-02-15 MED ORDER — SODIUM CHLORIDE 0.9 % IV SOLN
500.0000 mg | INTRAVENOUS | Status: DC
  Administered 2022-02-16: 500 mg via INTRAVENOUS
  Filled 2022-02-15: qty 5

## 2022-02-15 MED ORDER — MORPHINE SULFATE (PF) 4 MG/ML IV SOLN
4.0000 mg | Freq: Once | INTRAVENOUS | Status: AC
  Administered 2022-02-15: 4 mg via INTRAVENOUS
  Filled 2022-02-15: qty 1

## 2022-02-15 MED ORDER — ONDANSETRON HCL 4 MG PO TABS: 4.0000 mg | ORAL_TABLET | Freq: Four times a day (QID) | ORAL | Status: DC | PRN

## 2022-02-15 MED ORDER — OLANZAPINE 2.5 MG PO TABS
1.2500 mg | ORAL_TABLET | Freq: Every day | ORAL | Status: DC
  Administered 2022-02-16 – 2022-02-17 (×2): 1.25 mg via ORAL
  Filled 2022-02-15 (×2): qty 0.5

## 2022-02-15 MED ORDER — HYDROCOD POLI-CHLORPHE POLI ER 10-8 MG/5ML PO SUER
5.0000 mL | Freq: Two times a day (BID) | ORAL | Status: DC | PRN
  Administered 2022-02-16: 5 mL via ORAL
  Filled 2022-02-15: qty 5

## 2022-02-15 MED ORDER — IPRATROPIUM-ALBUTEROL 0.5-2.5 (3) MG/3ML IN SOLN
3.0000 mL | Freq: Four times a day (QID) | RESPIRATORY_TRACT | Status: DC
  Administered 2022-02-15 – 2022-02-16 (×2): 3 mL via RESPIRATORY_TRACT
  Filled 2022-02-15 (×2): qty 3

## 2022-02-15 MED ORDER — ACETAMINOPHEN 325 MG PO TABS: 650.0000 mg | ORAL_TABLET | Freq: Four times a day (QID) | ORAL | Status: DC | PRN

## 2022-02-15 MED ORDER — ASPIRIN EC 81 MG PO TBEC
81.0000 mg | DELAYED_RELEASE_TABLET | Freq: Every day | ORAL | Status: DC
  Administered 2022-02-16 – 2022-02-17 (×2): 81 mg via ORAL
  Filled 2022-02-15 (×2): qty 1

## 2022-02-15 MED ORDER — ATORVASTATIN CALCIUM 20 MG PO TABS
40.0000 mg | ORAL_TABLET | Freq: Every day | ORAL | Status: DC
  Administered 2022-02-16 – 2022-02-17 (×2): 40 mg via ORAL
  Filled 2022-02-15 (×2): qty 2

## 2022-02-15 MED ORDER — LAMOTRIGINE 100 MG PO TABS
200.0000 mg | ORAL_TABLET | Freq: Every day | ORAL | Status: DC
  Administered 2022-02-16 – 2022-02-17 (×2): 200 mg via ORAL
  Filled 2022-02-15 (×2): qty 2

## 2022-02-15 MED ORDER — ATENOLOL 25 MG PO TABS
25.0000 mg | ORAL_TABLET | Freq: Every day | ORAL | Status: DC
  Administered 2022-02-16 – 2022-02-17 (×2): 25 mg via ORAL
  Filled 2022-02-15 (×2): qty 1

## 2022-02-15 MED ORDER — IOHEXOL 350 MG/ML SOLN
100.0000 mL | Freq: Once | INTRAVENOUS | Status: AC | PRN
  Administered 2022-02-15: 100 mL via INTRAVENOUS

## 2022-02-15 MED ORDER — SODIUM CHLORIDE 0.9 % IV SOLN
2.0000 g | INTRAVENOUS | Status: DC
  Administered 2022-02-15: 2 g via INTRAVENOUS
  Filled 2022-02-15 (×2): qty 20

## 2022-02-15 MED ORDER — TRAZODONE HCL 50 MG PO TABS
25.0000 mg | ORAL_TABLET | Freq: Every evening | ORAL | Status: DC | PRN
  Administered 2022-02-16: 25 mg via ORAL
  Filled 2022-02-15: qty 1

## 2022-02-15 MED ORDER — ESCITALOPRAM OXALATE 10 MG PO TABS
20.0000 mg | ORAL_TABLET | Freq: Every day | ORAL | Status: DC
  Administered 2022-02-16 – 2022-02-17 (×2): 20 mg via ORAL
  Filled 2022-02-15 (×2): qty 2

## 2022-02-15 MED ORDER — IPRATROPIUM-ALBUTEROL 0.5-2.5 (3) MG/3ML IN SOLN
3.0000 mL | RESPIRATORY_TRACT | Status: DC | PRN
  Administered 2022-02-16: 3 mL via RESPIRATORY_TRACT
  Filled 2022-02-15: qty 3

## 2022-02-15 MED ORDER — OXYCODONE-ACETAMINOPHEN 5-325 MG PO TABS
1.0000 | ORAL_TABLET | Freq: Once | ORAL | Status: AC
  Administered 2022-02-15: 1 via ORAL
  Filled 2022-02-15: qty 1

## 2022-02-15 MED ORDER — ENOXAPARIN SODIUM 60 MG/0.6ML IJ SOSY
0.5000 mg/kg | PREFILLED_SYRINGE | INTRAMUSCULAR | Status: DC
  Administered 2022-02-16 – 2022-02-17 (×2): 50 mg via SUBCUTANEOUS
  Filled 2022-02-15 (×2): qty 0.6

## 2022-02-15 MED ORDER — EZETIMIBE 10 MG PO TABS
10.0000 mg | ORAL_TABLET | Freq: Every day | ORAL | Status: DC
  Administered 2022-02-16 – 2022-02-17 (×2): 10 mg via ORAL
  Filled 2022-02-15 (×2): qty 1

## 2022-02-15 MED ORDER — SODIUM CHLORIDE 0.9 % IV SOLN: 500.0000 mg | INTRAVENOUS | Status: DC

## 2022-02-15 NOTE — Progress Notes (Signed)
IV obtained by ED RN. 

## 2022-02-15 NOTE — Progress Notes (Signed)
Pt being followed by [facility] for Sepsis protocol. 

## 2022-02-15 NOTE — Progress Notes (Signed)
CODE SEPSIS - PHARMACY COMMUNICATION ? ?**Broad Spectrum Antibiotics should be administered within 1 hour of Sepsis diagnosis** ? ?Time Code Sepsis Called/Page Received: 2200 ? ?Antibiotics Ordered: Azithromycin & Ceftriaxone ? ?Time of 1st antibiotic administration: 2356 ? ?Pt transferred to floor without any abx shortly after code sepsis was initiated, significantly delay start of 1st dose. ? ?Otelia Sergeant, PharmD, MBA ?02/15/2022 ?10:07 PM ? ? ?

## 2022-02-15 NOTE — H&P (Addendum)
?  ?  ?[facility] ? ? ?PATIENT NAME: Jesus Mckinney   ? ?MR#:  [id number] ? ?DATE OF BIRTH:  31-Jan-1956 ? ?DATE OF ADMISSION:  02/15/2022 ? ?PRIMARY CARE PHYSICIAN: Pcp, No  ? ?Patient is coming from: Home ? ?REQUESTING/REFERRING PHYSICIAN: Harland Dingwall, MD ? ?CHIEF COMPLAINT:  ? ?Chief Complaint  ?Patient presents with  ? Flank Pain  ? ? ?HISTORY OF PRESENT ILLNESS:  ?Jesus Mckinney is a 66 y.o. Caucasian male with medical history significant for dyslipidemia and COPD, who presented to the emergency room with acute onset of worsening right-sided chest pain extending to his flank.  This pain has been significantly worsening with cough or deep breathing.  He denied any falls or trauma.  He was recently admitted here for COPD exacerbation and suspected viral illness with parainfluenza bronchitis as well as hyponatremia with associated hypoxemia from 02/07/2022 to 02/09/2022.  He was discharged on as needed Tussionex, scheduled Mucinex, as needed ibuprofen, prednisone and Augmentin.  He stated that he just finished Augmentin yesterday.  He denies any fever or chills.  No nausea or vomiting or abdominal pain.  No dysuria, oliguria or hematuria or flank pain.  He was later seen in the emergency room and managed for COPD exacerbation for 3 visits and this is his fourth visit. ? ?ED Course: Upon presentation to the emergency room blood pressure was 147/67 with a heart rate of 55 and pulse symmetry of 94% on room air.  Respiratory rate later on was 22.  Labs revealed leukocytosis 17.1 and neutrophilia with macrocytosis.  CMP revealed a calcium of 8.6 and ALT of 80 and troponin of 6.4.  High-sensitivity troponin I was 405 ?EKG as reviewed by me : EKG showed sinus bradycardia with a rate of 52 and incomplete right bundle branch block with low voltage QRS. ?Imaging: Chest CTA with abdomen and pelvic CT revealed the following: ?1. No evidence of pulmonary arterial embolus. ?2. Cardiomegaly without evidence of CHF. ?3. Aortic  and coronary artery atherosclerosis.  No AAA. ?4. New finding of nondisplaced right posterolateral seventh and ?eighth rib fractures and displaced right posterolateral ninth rib ?fracture, trace right pleural effusion or hemothorax without ?pneumothorax, and increased opacity in the right lower lobe which ?could be atelectasis, contusions or pneumonia. ?5. 1.3 x 0.7 cm nodular opacity in the posterior basal left lower ?lobe, seen on the most recent exam but not on last year's study. ?Agree with prior recommendation for three-month follow-up ?noncontrast CT to ensure resolution. Reference: Radiology 2017; ?[id number]. ?6. Hiatal hernia with mild thickening of the distal thoracic ?esophagus. Correlate clinically for reflux esophagitis. ?7. There appear to be early cirrhotic changes in the liver with ?steatosis, mildly prominent hepatic portal vein. There is no ?splenomegaly or ascites. ?8. Constipation, with terminal ileal fecal back up. No bowel ?obstruction or inflammation. Uncomplicated diverticulosis. ?9. Osteopenia, degenerative and DISH changes. No concerning regional ?bone lesion. ?10. Stranding in the right lateral chest and abdominal wall but no ?space-occupying hematoma, no free hemorrhage or free air. ? ?The patient was given IV Rocephin and Zithromax as well as 4 mg of IV morphine sulfate and p.o. Percocet.  He will be admitted to a medical telemetry bed for further evaluation and management. ?  ?PAST MEDICAL HISTORY:  ? ?Past Medical History:  ?Diagnosis Date  ? COPD (chronic obstructive pulmonary disease) (HCC)   ? Emphysema lung (HCC)   ?-Dyslipidemia ?-Major depression ?PAST SURGICAL HISTORY:  ? ?Past Surgical History:  ?Procedure Laterality Date  ?  JOINT REPLACEMENT    ? ? ?SOCIAL HISTORY:  ? ?Social History  ? ?Tobacco Use  ? Smoking status: Former  ?  Types: Cigarettes  ? Smokeless tobacco: Never  ?Substance Use Topics  ? Alcohol use: Not Currently  ? ? ?FAMILY HISTORY:  ?Positive for MI his  mother.  History is otherwise positive for hypertension and diabetes mellitus in his family. ? ?DRUG ALLERGIES:  ? ?Allergies  ?Allergen Reactions  ? Dust Mite Extract Cough  ? Gramineae Pollens Cough  ? ? ?REVIEW OF SYSTEMS:  ? ?ROS ?As per history of present illness. All pertinent systems were reviewed above. Constitutional, HEENT, cardiovascular, respiratory, GI, GU, musculoskeletal, neuro, psychiatric, endocrine, integumentary and hematologic systems were reviewed and are otherwise negative/unremarkable except for positive findings mentioned above in the HPI. ? ? ?MEDICATIONS AT HOME:  ? ?Prior to Admission medications   ?Medication Sig Start Date End Date Taking? Authorizing Provider  ?albuterol (ACCUNEB) 1.25 MG/3ML nebulizer solution Take 3 mLs (1.25 mg total) by nebulization every 4 (four) hours as needed for wheezing or shortness of breath. 02/09/22 02/09/23  Marzetta BoardSpongberg, Christopher N, MD  ?albuterol (VENTOLIN HFA) 108 (90 Base) MCG/ACT inhaler Inhale 1-2 puffs into the lungs every 6 (six) hours as needed. 08/13/21   [provider]  ?aspirin 81 MG EC tablet Take 81 mg by mouth daily.    [provider]  ?atenolol (TENORMIN) 25 MG tablet Take 25 mg by mouth daily. 01/30/22   [provider]  ?atorvastatin (LIPITOR) 40 MG tablet Take 40 mg by mouth daily. 06/19/21   [provider]  ?benzonatate (TESSALON PERLES) 100 MG capsule Take 1 capsule (100 mg total) by mouth 3 (three) times daily as needed for cough. 02/03/22 02/03/23  Sharman CheekStafford, Phillip, MD  ?chlorpheniramine-HYDROcodone [facility](TUSSIONEX PENNKINETIC ER) 10-8 MG/5ML Take 5 mLs by mouth every 12 (twelve) hours as needed for up to 7 days for cough. 02/09/22 02/16/22  Marzetta BoardSpongberg, Christopher N, MD  ?donepezil (ARICEPT) 5 MG tablet Take 5 mg by mouth daily. 01/27/22   [provider]  ?escitalopram (LEXAPRO) 20 MG tablet Take 20 mg by mouth daily. 06/10/21   [provider]  ?ezetimibe (ZETIA) 10 MG tablet Take 10 mg by  mouth daily. 01/11/22   [provider]  ?fluticasone-salmeterol (ADVAIR HFA) 230-21 MCG/ACT inhaler Inhale 2 puffs into the lungs 2 (two) times daily. 08/22/20   [provider]  ?guaiFENesin (MUCINEX) 600 MG 12 hr tablet Take 1 tablet (600 mg total) by mouth 2 (two) times daily for 15 days. 02/03/22 02/18/22  Sharman CheekStafford, Phillip, MD  ?HYDROcodone-acetaminophen (NORCO/VICODIN) 5-325 MG tablet Take 2 tablets by mouth every 6 (six) hours as needed for moderate pain. 02/09/22 02/09/23  Marzetta BoardSpongberg, Christopher N, MD  ?ibuprofen (ADVIL) 200 MG tablet Take 2 tablets (400 mg total) by mouth every 8 (eight) hours as needed for mild pain or moderate pain. 02/06/22   Lewie ChamberGirguis, David, MD  ?ipratropium-albuterol (DUONEB) 0.5-2.5 (3) MG/3ML SOLN Take 3 mLs by nebulization every 6 (six) hours as needed. 02/06/22   Lewie ChamberGirguis, David, MD  ?lamoTRIgine (LAMICTAL) 200 MG tablet Take 200 mg by mouth daily. 06/04/21   [provider]  ?OLANZapine (ZYPREXA) 2.5 MG tablet Take 1.25 mg by mouth daily. 06/10/21   [provider]  ?omeprazole (PRILOSEC) 40 MG capsule Take 40 mg by mouth daily. 06/20/21   [provider]  ? ?  ? ?VITAL SIGNS:  ?Blood pressure (!) 156/63, pulse (!) 46, temperature 98.7 ?F (37.1 ?C), temperature  source Oral, resp. rate 16, height 5\' 5"  (1.651 m), weight 100.2 kg, SpO2 96 %. ? ?PHYSICAL EXAMINATION:  ?Physical Exam ? ?GENERAL:  66 y.o.-year-old Caucasian  male patient lying in the bed with no acute distress.  ?EYES: Pupils equal, round, reactive to light and accommodation. No scleral icterus. Extraocular muscles intact.  ?HEENT: Head atraumatic, normocephalic. Oropharynx and nasopharynx clear.  ?NECK:  Supple, no jugular venous distention. No thyroid enlargement, no tenderness.  ?LUNGS: Diminished right basal breath sounds with right basal crackles.  No use of accessory muscles of respiration. ?Musculoskeletal/chest wall: Tenderness on the right lower chest wall over the seventh,  eighth and ninth ribs. ?CARDIOVASCULAR: Regular rate and rhythm, S1, S2 normal. No murmurs, rubs, or gallops.  ?ABDOMEN: Soft, nondistended, nontender. Bowel sounds present. No organomegaly or mass.  ?EXTREMITIE

## 2022-02-15 NOTE — ED Provider Notes (Signed)
? ?[facility] ?Provider Note ? ? ? Event Date/Time  ? First MD Initiated Contact with Patient 02/15/22 1751   ?  (approximate) ? ? ?History  ? ?Chief Complaint ?Flank Pain ? ? ?HPI ? ?Tia MaskerWilliam B Whitt is a 66 y.o. male with past medical history of hyperlipidemia and COPD who presents to the ED complaining of flank and chest pain.  Patient reports that he was recently admitted to the hospital for viral illness, COPD exacerbation, and right chest wall and flank pain attributed to his viral syndrome.  He was discharged from the hospital 6 days ago and states he was feeling better at that time, but since then has developed increasing pain in the same area as before.  He describes the pain as sharp and primarily in his right lateral chest wall and flank, exacerbated when he coughs or takes a deep breath.  He denies any falls or trauma to this area, but states the pain got significantly worse over the past couple of hours, when he also noticed increased bruising over his right flank.  He states his cough has improved and is nonproductive, denies any fevers.  He has not had any pain over his anterior chest.  He denies any abdominal pain, nausea, vomiting, dysuria, or hematuria.  He has been taking hydrocodone and ibuprofen without significant relief in pain. ?  ? ? ?Physical Exam  ? ?Triage Vital Signs: ?ED Triage Vitals  ?Enc Vitals Group  ?   BP 02/15/22 1743 (!) 147/67  ?   Pulse Rate 02/15/22 1743 (!) 55  ?   Resp 02/15/22 1743 20  ?   Temp 02/15/22 1743 98.2 ?F (36.8 ?C)  ?   Temp Source 02/15/22 1743 Oral  ?   SpO2 02/15/22 1743 94 %  ?   Weight 02/15/22 1745 222 lb (100.7 kg)  ?   Height 02/15/22 1745 5\' 5"  (1.651 m)  ?   Head Circumference --   ?   Peak Flow --   ?   Pain Score 02/15/22 1744 10  ?   Pain Loc --   ?   Pain Edu? --   ?   Excl. in GC? --   ? ? ?Most recent vital signs: ?Vitals:  ? 02/15/22 2000 02/15/22 2130  ?BP: (!) 154/66 122/62  ?Pulse: (!) 56 (!) 50  ?Resp: 15 16  ?Temp:     ?SpO2: 99% 94%  ? ? ?Constitutional: Alert and oriented. ?Eyes: Conjunctivae are normal. ?Head: Atraumatic. ?Nose: No congestion/rhinnorhea. ?Mouth/Throat: Mucous membranes are moist.  ?Cardiovascular: Normal rate, regular rhythm. Grossly normal heart sounds.  2+ radial pulses bilaterally. ?Respiratory: Normal respiratory effort.  No retractions. Lungs CTAB.  Right lateral chest wall tenderness to palpation with no overlying erythema or warmth. ?Gastrointestinal: Soft and nontender.  No CVA tenderness bilaterally.  No distention.  Ecchymosis noted over right lower lateral abdominal wall, no focal fluid collection. ?Musculoskeletal: No lower extremity tenderness nor edema.  ?Neurologic:  Normal speech and language. No gross focal neurologic deficits are appreciated. ? ? ? ?ED Results / Procedures / Treatments  ? ?Labs ?(all labs ordered are listed, but only abnormal results are displayed) ?Labs Reviewed  ?CBC WITH DIFFERENTIAL/PLATELET - Abnormal; Notable for the following components:  ?    Result Value  ? WBC 17.1 (*)   ? RBC 4.09 (*)   ? MCV 100.5 (*)   ? Neutro Abs 15.1 (*)   ? Abs Immature Granulocytes 0.21 (*)   ?  All other components within normal limits  ?URINALYSIS, ROUTINE W REFLEX MICROSCOPIC - Abnormal; Notable for the following components:  ? Color, Urine STRAW (*)   ? APPearance CLEAR (*)   ? All other components within normal limits  ?COMPREHENSIVE METABOLIC PANEL - Abnormal; Notable for the following components:  ? Glucose, Bld 104 (*)   ? Calcium 8.6 (*)   ? Total Protein 6.4 (*)   ? ALT 80 (*)   ? All other components within normal limits  ?CULTURE, BLOOD (ROUTINE X 2)  ?CULTURE, BLOOD (ROUTINE X 2)  ?LIPASE, BLOOD  ?BASIC METABOLIC PANEL  ?CBC  ?LACTIC ACID, PLASMA  ?LACTIC ACID, PLASMA  ?PROTIME-INR  ?APTT  ?TROPONIN I (HIGH SENSITIVITY)  ?TROPONIN I (HIGH SENSITIVITY)  ? ? ? ?EKG ? ?ED ECG REPORT ?Harriet Masson, the attending physician, personally viewed and interpreted this ECG. ? ? Date:  02/15/2022 ? EKG Time: 18:25 ? Rate: 52 ? Rhythm: sinus bradycardia ? Axis: Normal ? Intervals: Incomplete RBBB ? ST&T Change: None ? ?RADIOLOGY ?CTA of chest reviewed by me with no large pulmonary embolus, multiple right-sided rib fractures noted. ? ?PROCEDURES: ? ?Critical Care performed: No ? ?Procedures ? ? ?MEDICATIONS ORDERED IN ED: ?Medications  ?lidocaine (LIDODERM) 5 % 1 patch (1 patch Transdermal Patch Applied 02/15/22 1836)  ?cefTRIAXone (ROCEPHIN) 1 g in sodium chloride 0.9 % 100 mL IVPB (has no administration in time range)  ?azithromycin (ZITHROMAX) 500 mg in sodium chloride 0.9 % 250 mL IVPB (has no administration in time range)  ?oxyCODONE-acetaminophen (PERCOCET/ROXICET) 5-325 MG per tablet 1 tablet (has no administration in time range)  ?aspirin EC tablet 81 mg (has no administration in time range)  ?HYDROcodone-acetaminophen (NORCO/VICODIN) 5-325 MG per tablet 2 tablet (has no administration in time range)  ?atenolol (TENORMIN) tablet 25 mg (has no administration in time range)  ?atorvastatin (LIPITOR) tablet 40 mg (has no administration in time range)  ?ezetimibe (ZETIA) tablet 10 mg (has no administration in time range)  ?donepezil (ARICEPT) tablet 5 mg (has no administration in time range)  ?escitalopram (LEXAPRO) tablet 20 mg (has no administration in time range)  ?OLANZapine (ZYPREXA) tablet 1.25 mg (has no administration in time range)  ?pantoprazole (PROTONIX) EC tablet 40 mg (has no administration in time range)  ?lamoTRIgine (LAMICTAL) tablet 200 mg (has no administration in time range)  ?benzonatate (TESSALON) capsule 100 mg (has no administration in time range)  ?chlorpheniramine-HYDROcodone 10-8 MG/5ML suspension 5 mL (has no administration in time range)  ?guaiFENesin (MUCINEX) 12 hr tablet 600 mg (has no administration in time range)  ?ipratropium-albuterol (DUONEB) 0.5-2.5 (3) MG/3ML nebulizer solution 3 mL (has no administration in time range)  ?cefTRIAXone (ROCEPHIN) 2 g in sodium  chloride 0.9 % 100 mL IVPB (has no administration in time range)  ?azithromycin (ZITHROMAX) 500 mg in sodium chloride 0.9 % 250 mL IVPB (has no administration in time range)  ?enoxaparin (LOVENOX) injection 40 mg (has no administration in time range)  ?0.9 %  sodium chloride infusion (has no administration in time range)  ?acetaminophen (TYLENOL) tablet 650 mg (has no administration in time range)  ?  Or  ?acetaminophen (TYLENOL) suppository 650 mg (has no administration in time range)  ?traZODone (DESYREL) tablet 25 mg (has no administration in time range)  ?magnesium hydroxide (MILK OF MAGNESIA) suspension 30 mL (has no administration in time range)  ?ondansetron (ZOFRAN) tablet 4 mg (has no administration in time range)  ?  Or  ?ondansetron (ZOFRAN) injection 4 mg (has no administration  in time range)  ?cefTRIAXone (ROCEPHIN) 2 g in sodium chloride 0.9 % 100 mL IVPB (has no administration in time range)  ?azithromycin (ZITHROMAX) 500 mg in sodium chloride 0.9 % 250 mL IVPB (has no administration in time range)  ?morphine (PF) 4 MG/ML injection 4 mg (4 mg Intravenous Given 02/15/22 1903)  ?iohexol (OMNIPAQUE) 350 MG/ML injection 100 mL (100 mLs Intravenous Contrast Given 02/15/22 2029)  ?morphine (PF) 4 MG/ML injection 4 mg (4 mg Intravenous Given 02/15/22 2021)  ? ? ? ?IMPRESSION / MDM / ASSESSMENT AND PLAN / ED COURSE  ?I reviewed the triage vital signs and the nursing notes. ?             ?               ? ?66 y.o. male with past medical history of hyperlipidemia and COPD who presents to the ED complaining of increasing right flank and chest wall pain following recent admission for similar discomfort with COPD exacerbation and viral illness. ? ?Differential diagnosis includes, but is not limited to, PE, ACS, pneumonia, pneumothorax, hemothorax, rib fracture, kidney stone, pyelonephritis, hepatitis, cholecystitis, traumatic chest wall injury. ? ?Patient nontoxic-appearing and in no acute distress, vital signs are  unremarkable and EKG shows no evidence of arrhythmia or ischemia.  He is primarily tender over his right lateral costal margin, no CVA tenderness to suggest pyelonephritis and he denies any urinary sympt

## 2022-02-15 NOTE — ED Notes (Signed)
RN at bedside attempting IV [place]  ?

## 2022-02-15 NOTE — ED Triage Notes (Signed)
Pt states he is having pain in his R side that radiates into his back- pt denies urinary symptoms- pt has bruising on his R side from a previous injury- pt states the pain has gotten worse over the last hour- pt was recently released from here on Monday ?

## 2022-02-15 NOTE — Progress Notes (Signed)
PHARMACIST - PHYSICIAN COMMUNICATION ? ?CONCERNING:  Enoxaparin (Lovenox) for DVT Prophylaxis  ? ? ?RECOMMENDATION: ?Patient was prescribed enoxaprin 40mg  q24 hours for VTE prophylaxis.  ? ?Filed Weights  ? 02/15/22 1745  ?Weight: 100.7 kg (222 lb)  ? ? ?Body mass index is 36.94 kg/m?. ? ?Estimated Creatinine Clearance: 80.1 mL/min (by C-G formula based on SCr of 0.99 mg/dL). ? ? ?Based on [facility] policy patient is candidate for enoxaparin 0.5mg /kg TBW SQ every 24 hours based on BMI being >30. ? ?DESCRIPTION: ?Pharmacy has adjusted enoxaparin dose per [facility] policy. ? ?Patient is now receiving enoxaparin 0.5 mg/kg every 24 hours  ? ?[facility], PharmD, MBA ?02/15/2022 ?10:09 PM ? ?

## 2022-02-15 NOTE — ED Notes (Signed)
This RN attempting IV access x2 without success. ?

## 2022-02-16 DIAGNOSIS — I10 Essential (primary) hypertension: Secondary | ICD-10-CM | POA: Diagnosis present

## 2022-02-16 DIAGNOSIS — F419 Anxiety disorder, unspecified: Secondary | ICD-10-CM | POA: Insufficient documentation

## 2022-02-16 DIAGNOSIS — E785 Hyperlipidemia, unspecified: Secondary | ICD-10-CM | POA: Diagnosis present

## 2022-02-16 DIAGNOSIS — S301XXA Contusion of abdominal wall, initial encounter: Secondary | ICD-10-CM | POA: Diagnosis present

## 2022-02-16 DIAGNOSIS — S2249XA Multiple fractures of ribs, unspecified side, initial encounter for closed fracture: Secondary | ICD-10-CM

## 2022-02-16 DIAGNOSIS — A419 Sepsis, unspecified organism: Secondary | ICD-10-CM | POA: Diagnosis present

## 2022-02-16 DIAGNOSIS — J449 Chronic obstructive pulmonary disease, unspecified: Secondary | ICD-10-CM | POA: Diagnosis present

## 2022-02-16 DIAGNOSIS — F329 Major depressive disorder, single episode, unspecified: Secondary | ICD-10-CM | POA: Insufficient documentation

## 2022-02-16 DIAGNOSIS — K219 Gastro-esophageal reflux disease without esophagitis: Secondary | ICD-10-CM | POA: Diagnosis present

## 2022-02-16 DIAGNOSIS — R0781 Pleurodynia: Secondary | ICD-10-CM | POA: Diagnosis present

## 2022-02-16 LAB — BASIC METABOLIC PANEL
Anion gap: 7 (ref 5–15)
BUN: 16 mg/dL (ref 8–23)
CO2: 21 mmol/L — ABNORMAL LOW (ref 22–32)
Calcium: 8.1 mg/dL — ABNORMAL LOW (ref 8.9–10.3)
Chloride: 109 mmol/L (ref 98–111)
Creatinine, Ser: 0.92 mg/dL (ref 0.61–1.24)
GFR, Estimated: >60 mL/min (ref >60)
Glucose, Bld: 93 mg/dL (ref 70–99)
Potassium: 3.9 mmol/L (ref 3.5–5.1)
Sodium: 137 mmol/L (ref 135–145)

## 2022-02-16 LAB — CBC
HCT: 40.1 % (ref 39.0–52.0)
Hemoglobin: 13 g/dL (ref 13.0–17.0)
MCH: 31.9 pg (ref 26.0–34.0)
MCHC: 32.4 g/dL (ref 30.0–36.0)
MCV: 98.5 fL (ref 80.0–100.0)
Platelets: 246 10*3/uL (ref 150–400)
RBC: 4.07 MIL/uL — ABNORMAL LOW (ref 4.22–5.81)
RDW: 13.2 % (ref 11.5–15.5)
WBC: 13.7 10*3/uL — ABNORMAL HIGH (ref 4.0–10.5)
nRBC: 0 % (ref 0.0–0.2)

## 2022-02-16 LAB — LACTIC ACID, PLASMA: Lactic Acid, Venous: 1.2 mmol/L (ref 0.5–1.9)

## 2022-02-16 LAB — PROCALCITONIN: Procalcitonin: <0.1 ng/mL

## 2022-02-16 MED ORDER — KETOROLAC TROMETHAMINE 15 MG/ML IJ SOLN
15.0000 mg | Freq: Once | INTRAMUSCULAR | Status: AC
  Administered 2022-02-16: 15 mg via INTRAVENOUS
  Filled 2022-02-16: qty 1

## 2022-02-16 MED ORDER — AZITHROMYCIN 250 MG PO TABS
500.0000 mg | ORAL_TABLET | Freq: Every day | ORAL | Status: DC
  Administered 2022-02-16: 500 mg via ORAL
  Filled 2022-02-16: qty 2

## 2022-02-16 MED ORDER — IPRATROPIUM-ALBUTEROL 0.5-2.5 (3) MG/3ML IN SOLN
3.0000 mL | RESPIRATORY_TRACT | Status: DC
  Administered 2022-02-16 – 2022-02-17 (×7): 3 mL via RESPIRATORY_TRACT
  Filled 2022-02-16 (×7): qty 3

## 2022-02-16 MED ORDER — MOMETASONE FURO-FORMOTEROL FUM 200-5 MCG/ACT IN AERO
2.0000 | INHALATION_SPRAY | Freq: Two times a day (BID) | RESPIRATORY_TRACT | Status: DC
  Administered 2022-02-16 – 2022-02-17 (×3): 2 via RESPIRATORY_TRACT
  Filled 2022-02-16: qty 8.8

## 2022-02-16 MED ORDER — MORPHINE SULFATE (PF) 2 MG/ML IV SOLN
2.0000 mg | INTRAVENOUS | Status: DC | PRN
  Administered 2022-02-16: 2 mg via INTRAVENOUS
  Filled 2022-02-16: qty 1

## 2022-02-16 MED ORDER — KETOROLAC TROMETHAMINE 15 MG/ML IJ SOLN
15.0000 mg | Freq: Four times a day (QID) | INTRAMUSCULAR | Status: DC | PRN
Start: 2022-02-16 — End: 2022-02-17
  Administered 2022-02-17: 15 mg via INTRAVENOUS
  Filled 2022-02-16: qty 1

## 2022-02-16 MED ORDER — ALBUTEROL SULFATE (2.5 MG/3ML) 0.083% IN NEBU
2.5000 mg | INHALATION_SOLUTION | RESPIRATORY_TRACT | Status: DC | PRN
Start: 2022-02-16 — End: 2022-02-17

## 2022-02-16 NOTE — TOC Initial Note (Signed)
Transition of Care (TOC) - Initial/Assessment Note  ? ? ?Patient Details  ?Name: Jesus Mckinney ?MRN: [id number] ?Date of Birth: 19-Jan-1956 ? ?Transition of Care (TOC) CM/SW Contact:    ?Joseph Art, LCSW ?Phone Number: [phone] ?02/16/2022, 4:07 PM ? ?Clinical Narrative:                 ? ?Patient presents due to right side pain that radiates to back. Patient found to have nondisplaced right posterolateral seventh and eighth rib fractures and displaced right posterolateral ninth rib fracture and was started on ceftriaxone and Zithromax for concern of community-acquired pneumonia.Pending PT/OT consults.  Patient independent with most ADLs. Estimated d/c planned for 24-48 hours. Main contact Milillo,Myrna (Spouse) [phone] (Mobile) ? ?Expected Discharge Plan: Home w Home Health Services ?Barriers to Discharge: Continued Medical Work up ? ? ?Patient Goals and CMS Choice ?  ?  ?  ? ?Expected Discharge Plan and Services ?Expected Discharge Plan: Home w Home Health Services ?In-house Referral: Clinical Social Work ?  ?Post Acute Care Choice: Home Health ?Living arrangements for the past 2 months: Single Family Home ?                ?  ?  ?  ?  ?  ?  ?  ?  ?  ?  ? ?Prior Living Arrangements/Services ?Living arrangements for the past 2 months: Single Family Home ?Lives with:: Spouse ?Patient language and need for interpreter reviewed:: Yes ?Do you feel safe going back to the place where you live?: Yes      ?Need for Family Participation in Patient Care: Yes (Comment) ?Care giver support system in place?: Yes (comment) ?  ?Criminal Activity/Legal Involvement Pertinent to Current Situation/Hospitalization: No - Comment as needed ? ?Activities of Daily Living ?Home Assistive Devices/Equipment: None, Nebulizer ?ADL Screening (condition at time of admission) ?Patient's cognitive ability adequate to safely complete daily activities?: Yes ?Is the patient deaf or have difficulty hearing?: No ?Does the patient have  difficulty seeing, even when wearing glasses/contacts?: No ?Does the patient have difficulty concentrating, remembering, or making decisions?: No ?Patient able to express need for assistance with ADLs?: Yes ?Does the patient have difficulty dressing or bathing?: No ?Independently performs ADLs?: Yes (appropriate for developmental age) ?Does the patient have difficulty walking or climbing stairs?: Yes ?Weakness of Legs: None ?Weakness of Arms/Hands: None ? ?Permission Sought/Granted ?Permission sought to share information with : Family Supports ?Permission granted to share information with : Yes, Verbal Permission Granted ? Share Information with NAME: Merlyn, Conley (Spouse)   [phone] (Mobile) ?   ?   ?   ? ?Emotional Assessment ?Appearance:: Appears stated age ?Attitude/Demeanor/Rapport: Engaged ?Affect (typically observed): Stable ?Orientation: : Oriented to Self, Oriented to Place, Oriented to  Time, Oriented to Situation ?Alcohol / Substance Use: Not Applicable ?Psych Involvement: No (comment) ? ?Admission diagnosis:  CAP (community acquired pneumonia) [J18.9] ?Closed fracture of multiple ribs of right side, initial encounter [S22.41XA] ?Community acquired pneumonia of right lower lobe of lung [J18.9] ?Patient Active Problem List  ? Diagnosis Date Noted  ? Sepsis due to pneumonia (HCC) 02/16/2022  ? Pleuritic chest pain 02/16/2022  ? Rib fractures 02/16/2022  ? Chronic obstructive pulmonary disease (COPD) (HCC) 02/16/2022  ? Major depression 02/16/2022  ? GERD without esophagitis 02/16/2022  ? Anxiety 02/16/2022  ? Essential hypertension 02/16/2022  ? Dyslipidemia 02/16/2022  ? Abdominal wall contusion 02/16/2022  ? CAP (community acquired pneumonia) 02/15/2022  ? Respiratory failure with hypoxia (HCC) 02/08/2022  ? Acute  respiratory failure (HCC) 02/07/2022  ? COPD exacerbation (HCC) 02/06/2022  ? Parainfluenza virus bronchitis 02/06/2022  ? Pneumonia 02/05/2022  ? COPD with acute exacerbation (HCC)  02/05/2022  ? Obesity (BMI 30-39.9) 02/05/2022  ? Hyponatremia 02/05/2022  ? Persistent cough 04/19/2021  ? Stopped smoking with greater than 30 pack year history 04/19/2021  ? Arthropathy of cervical facet joint 04/16/2021  ? Dyslipidemia, goal LDL below 70 03/29/2021  ? Aortic atherosclerosis (HCC) 02/20/2021  ? Hepatic steatosis 02/20/2021  ? Renal cyst 01/11/2021  ? History of colon polyps 10/16/2020  ? 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 OSA (obstructive sleep apnea) 10/16/2020  ? Panlobular emphysema (HCC) 10/16/2020  ? Prediabetes 10/16/2020  ? Vertigo 10/16/2020  ? Weight gain 10/16/2020  ? ?PCP:  Pcp, No ?Pharmacy:   ?[id number] [place], [place] - [address] ?[address] ?[place] [address] ?Phone: [phone] Fax: [phone] ? ?[id number] [place], [place] - [address] ?[address] ?[place] [address] ?Phone: [phone] Fax: [phone] ? ? ? ? ?Social Determinants of Health (SDOH) Interventions ?  ? ?Readmission Risk Interventions ? ?  02/06/2022  ? 10:08 AM  ?Readmission Risk Prevention Plan  ?Transportation Screening Complete  ?PCP or Specialist Appt within 3-5 Days Complete  ?HRI or Home Care Consult Complete  ?Social Work Consult for Recovery Care Planning/Counseling Complete  ?Palliative Care Screening Not Applicable  ?Medication Review Oceanographer) Referral to Pharmacy  ? ? ? ?

## 2022-02-16 NOTE — Assessment & Plan Note (Signed)
Management as above °

## 2022-02-16 NOTE — Assessment & Plan Note (Addendum)
Blood pressure within goal. ?- We will continue to atenolol. ?

## 2022-02-16 NOTE — Plan of Care (Signed)
?  Problem: Activity: ?Goal: Ability to tolerate increased activity will improve ?02/16/2022 0059 by Jordan Likes, RN ?Outcome: Progressing ?02/16/2022 0058 by Jordan Likes, RN ?Outcome: Progressing ?  ?Problem: Clinical Measurements: ?Goal: Ability to maintain a body temperature in the normal range will improve ?02/16/2022 0059 by Jordan Likes, RN ?Outcome: Progressing ?02/16/2022 0058 by Jordan Likes, RN ?Outcome: Progressing ?  ?Problem: Respiratory: ?Goal: Ability to maintain adequate ventilation will improve ?02/16/2022 0059 by Jordan Likes, RN ?Outcome: Progressing ?02/16/2022 0058 by Jordan Likes, RN ?Outcome: Progressing ?Goal: Ability to maintain a clear airway will improve ?02/16/2022 0059 by Jordan Likes, RN ?Outcome: Progressing ?02/16/2022 0058 by Jordan Likes, RN ?Outcome: Progressing ?  ?Problem: Education: ?Goal: Knowledge of General Education information will improve ?Description: Including pain rating scale, medication(s)/side effects and non-pharmacologic comfort measures ?Outcome: Progressing ?  ?Problem: Health Behavior/Discharge Planning: ?Goal: Ability to manage health-related needs will improve ?Outcome: Progressing ?  ?Problem: Clinical Measurements: ?Goal: Ability to maintain clinical measurements within normal limits will improve ?Outcome: Progressing ?Goal: Will remain free from infection ?Outcome: Progressing ?Goal: Diagnostic test results will improve ?Outcome: Progressing ?Goal: Respiratory complications will improve ?Outcome: Progressing ?Goal: Cardiovascular complication will be avoided ?Outcome: Progressing ?  ?Problem: Activity: ?Goal: Risk for activity intolerance will decrease ?Outcome: Progressing ?  ?Problem: Nutrition: ?Goal: Adequate nutrition will be maintained ?Outcome: Progressing ?  ?Problem: Coping: ?Goal: Level of anxiety will decrease ?Outcome: Progressing ?  ?Problem: Elimination: ?Goal: Will not experience complications related  to bowel motility ?Outcome: Progressing ?Goal: Will not experience complications related to urinary retention ?Outcome: Progressing ?  ?Problem: Pain Managment: ?Goal: General experience of comfort will improve ?Outcome: Progressing ?  ?Problem: Safety: ?Goal: Ability to remain free from injury will improve ?Outcome: Progressing ?  ?Problem: Skin Integrity: ?Goal: Risk for impaired skin integrity will decrease ?Outcome: Progressing ?  ?

## 2022-02-16 NOTE — Progress Notes (Signed)
PHARMACIST - PHYSICIAN COMMUNICATION ? ?CONCERNING: Antibiotic IV to Oral Route Change Policy ? ?RECOMMENDATION: ?This patient is receiving azithromycin by the intravenous route.  Based on criteria approved by the Pharmacy and Therapeutics Committee, the antibiotic(s) is/are being converted to the equivalent oral dose form(s). ? ? ?DESCRIPTION: ?These criteria include: ?Patient being treated for a respiratory tract infection, urinary tract infection, cellulitis or clostridium difficile associated diarrhea if on metronidazole ?The patient is not neutropenic and does not exhibit a GI malabsorption state ?The patient is eating (either orally or via tube) and/or has been taking other orally administered medications for a least 24 hours ?The patient is improving clinically and has a Tmax < 100.5 ? ?If you have questions about this conversion, please contact the Pharmacy Department  ? ?Doris Gruhn B Florice Hindle  ?02/16/22  ?  ?

## 2022-02-16 NOTE — Assessment & Plan Note (Signed)
-   We will place him on scheduled and as needed DuoNebs. ?- We will continue his Pulmicort. ?

## 2022-02-16 NOTE — Assessment & Plan Note (Signed)
-   We will continue statin therapy. 

## 2022-02-16 NOTE — Assessment & Plan Note (Addendum)
Sepsis ruled out.  No obvious source of infection.  Most likely SIRS response with rib fractures.  Discontinuing ceftriaxone.  Procalcitonin negative. ?-Continue with Zithromax for its pulmonary anti-inflammatory effect. ?-Continue with pain management ?-Continue with supportive care for extensive cough. ?- Bronchodilator therapy will be provided with DuoNebs on a scheduled and as needed basis. ? ?

## 2022-02-16 NOTE — Progress Notes (Signed)
?Progress Note ? ? ?Patient: Jesus Mckinney SPQ:[id number] DOB: 16-Sep-1956 DOA: 02/15/2022     1 ?DOS: the patient was seen and examined on 02/16/2022 ?  ?Brief hospital course: ?Taken from H&P. ? ?KESLEY GAFFEY is a 66 y.o. Caucasian male with medical history significant for dyslipidemia and COPD, who presented to the emergency room with acute onset of worsening right-sided chest pain extending to his flank.  This pain has been significantly worsening with cough or deep breathing.  He denied any falls or trauma.  He was recently admitted here for COPD exacerbation and suspected viral illness with parainfluenza bronchitis as well as hyponatremia with associated hypoxemia from 02/07/2022 to 02/09/2022.  He was discharged on as needed Tussionex, scheduled Mucinex, as needed ibuprofen, prednisone and Augmentin.  He stated that he just finished Augmentin yesterday.  He denies any fever or chills.  No nausea or vomiting or abdominal pain.  No dysuria, oliguria or hematuria. ? ?On presentation to ED he was hemodynamically stable, labs only pertinent for leukocytosis, neutrophil predominant. ?EKG with sinus bradycardia and right bundle branch block, no acute changes ? ? Chest CTA with abdomen and pelvic CT revealed the following: ?1. No evidence of pulmonary arterial embolus. ?2. Cardiomegaly without evidence of CHF. ?3. Aortic and coronary artery atherosclerosis.  No AAA. ?4. New finding of nondisplaced right posterolateral seventh and ?eighth rib fractures and displaced right posterolateral ninth rib ?fracture, trace right pleural effusion or hemothorax without ?pneumothorax, and increased opacity in the right lower lobe which ?could be atelectasis, contusions or pneumonia. ?5. 1.3 x 0.7 cm nodular opacity in the posterior basal left lower ?lobe, seen on the most recent exam but not on last year's study. ?Agree with prior recommendation for three-month follow-up ?noncontrast CT to ensure resolution. Reference: Radiology  2017; ?[id number]. ?6. Hiatal hernia with mild thickening of the distal thoracic ?esophagus. Correlate clinically for reflux esophagitis. ?7. There appear to be early cirrhotic changes in the liver with ?steatosis, mildly prominent hepatic portal vein. There is no ?splenomegaly or ascites. ?8. Constipation, with terminal ileal fecal back up. No bowel ?obstruction or inflammation. Uncomplicated diverticulosis. ?9. Osteopenia, degenerative and DISH changes. No concerning regional ?bone lesion. ?10. Stranding in the right lateral chest and abdominal wall but no ?space-occupying hematoma, no free hemorrhage or free air.  ? ?Patient was started on ceftriaxone and Zithromax for concern of community-acquired pneumonia. ?Blood cultures remain negative. ?UA was not impressive for any infection. ? ?4/23: Check procalcitonin and it was negative.  Most likely SIRS response and leukocytosis secondary to these recent fractures.  No obvious source of infection found.  Sepsis ruled out. ?Imaging concerning for osteopenia, most likely pathologic fracture due to back cough. ?Right lower abdominal wall ecchymosis can also be related to small vessel ruptures with significant cough as there is no significant hematoma found. ?Patient need to get a bone density by PCP and started on osteoporosis management. ?Ceftriaxone was discontinued. ?We will continue Zithromax for its anti-inflammatory effect on lungs. ? ? ? ?Assessment and Plan: ?* Sepsis due to pneumonia [facility]) ?Sepsis ruled out.  No obvious source of infection.  Most likely SIRS response with rib fractures.  Discontinuing ceftriaxone.  Procalcitonin negative. ?-Continue with Zithromax for its pulmonary anti-inflammatory effect. ?-Continue with pain management ?-Continue with supportive care for extensive cough. ?- Bronchodilator therapy will be provided with DuoNebs on a scheduled and as needed basis. ? ? ?Pleuritic chest pain ?- This is partly pleuritic and partly related to his  rib  fractures. ?- Pain management to be provided with as needed IV Toradol. ? ?CAP (community acquired pneumonia) ?Management as above. ? ?Rib fractures ?- This involves the right seventh, eighth and ninth ribs. ?Concern of osteopenia and excessive cough resulted in rib fracture, no obvious trauma or injury. ?- Pain management will be provided with as needed IV morphine sulfate and Toradol. ? ?Chronic obstructive pulmonary disease (COPD) (HCC) ?- We will place him on scheduled and as needed DuoNebs. ?- We will continue his Pulmicort. ? ?Abdominal wall contusion ?-It is currently asymptomatic.  Most likely secondary to superficial blood vessel rupture with coughing, no history of any trauma or fall.  No hematoma on images ?- Pain management will be provided as needed. ?- We will monitor its size. ? ?Essential hypertension ?Blood pressure within goal. ?- We will continue to atenolol. ? ?GERD without esophagitis ?- Continue with PPI. ? ?Dyslipidemia ?- We will continue statin therapy. ? ? ?Subjective: Patient continued to have right-sided lower chest pain.  Denies any trauma or fall.  Stating that he has ready strong cough which really aggravates his pain.  No fever or chills. ? ?Physical Exam: ?Vitals:  ? 02/16/22 0407 02/16/22 0801 02/16/22 8101 02/16/22 1229  ?BP:   (!) 115/50   ?Pulse:   (!) 47 (!) 50  ?Resp:   18 18  ?Temp:   97.8 ?F (36.6 ?C)   ?TempSrc:      ?SpO2: 98% 97% 97% 96%  ?Weight:      ?Height:      ? ?General.  Well-developed gentleman, in no acute distress. ?Pulmonary.  Lungs clear bilaterally, normal respiratory effort.  Marked tenderness along right lower chest wall. ?CV.  Regular rate and rhythm, no JVD, rub or murmur. ?Abdomen.  Soft, nontender, nondistended, BS positive.  Significant ecchymosis involving right mid to lower abdomenal wall.  No tenderness. ?CNS.  Alert and oriented .  No focal neurologic deficit. ?Extremities.  No edema, no cyanosis, pulses intact and symmetrical. ?Psychiatry.   Judgment and insight appears normal. ? ?Data Reviewed: ?Prior notes, labs and images reviewed ? ?Family Communication: Discussed with patient ? ?Disposition: ?Status is: Inpatient ?Remains inpatient appropriate because: Severity of illness ? ? Planned Discharge Destination: Home with Home Health ? ?Time spent: 50 minutes ? ?This record has been created using Conservation officer, historic buildings. Errors have been sought and corrected,but may not always be located. Such creation errors do not reflect on the standard of care. ? ?Author: ?Arnetha Courser, MD ?02/16/2022 1:47 PM ? ?For on call review [email or web address].  ?

## 2022-02-16 NOTE — Plan of Care (Signed)
?  Problem: Activity: ?Goal: Ability to tolerate increased activity will improve ?Outcome: Progressing ?  ?Problem: Respiratory: ?Goal: Ability to maintain adequate ventilation will improve ?Outcome: Progressing ?Goal: Ability to maintain a clear airway will improve ?Outcome: Progressing ?  ?Problem: Health Behavior/Discharge Planning: ?Goal: Ability to manage health-related needs will improve ?Outcome: Progressing ?  ?Problem: Clinical Measurements: ?Goal: Respiratory complications will improve ?Outcome: Progressing ?  ?Problem: Activity: ?Goal: Risk for activity intolerance will decrease ?Outcome: Progressing ?  ?Problem: Nutrition: ?Goal: Adequate nutrition will be maintained ?Outcome: Progressing ?  ?Problem: Coping: ?Goal: Level of anxiety will decrease ?Outcome: Progressing ?  ?Problem: Pain Managment: ?Goal: General experience of comfort will improve ?Outcome: Progressing ?  ?Problem: Safety: ?Goal: Ability to remain free from injury will improve ?Outcome: Progressing ?  ?

## 2022-02-16 NOTE — Progress Notes (Signed)
Patient called nurse to room stating "I can't breath, it feels like I'm suffocating, I started coughing and that stuff was coming up and I can't get it out". Nurse educated pt on drinking plenty of water to help thin mucous using incentive spirometer and patient was moved from bed to recliner. Lung sounds diminished and clear. Respiratory therapist notified and also came to room to see and speak with patient. Patient not noted to be having any distressed breathing and sats were at 98% on room air. Patient told nurse that his pulmonologist told him his sats can be good and he could still suffocate. Nurse educated patient that if he were to stop moving air within his respiratory system his sats will drop and lung sounds would not be heard. Will continue to monitor patient.  ?

## 2022-02-16 NOTE — Assessment & Plan Note (Addendum)
-  It is currently asymptomatic.  Most likely secondary to superficial blood vessel rupture with coughing, no history of any trauma or fall.  No hematoma on images ?- Pain management will be provided as needed. ?- We will monitor its size. ?

## 2022-02-16 NOTE — Hospital Course (Addendum)
Taken from H&P. ? ?Jesus Mckinney is a 66 y.o. Caucasian male with medical history significant for dyslipidemia and COPD, who presented to the emergency room with acute onset of worsening right-sided chest pain extending to his flank.  This pain has been significantly worsening with cough or deep breathing.  He denied any falls or trauma.  He was recently admitted here for COPD exacerbation and suspected viral illness with parainfluenza bronchitis as well as hyponatremia with associated hypoxemia from 02/07/2022 to 02/09/2022.  He was discharged on as needed Tussionex, scheduled Mucinex, as needed ibuprofen, prednisone and Augmentin.  He stated that he just finished Augmentin yesterday.  He denies any fever or chills.  No nausea or vomiting or abdominal pain.  No dysuria, oliguria or hematuria. ? ?On presentation to ED he was hemodynamically stable, labs only pertinent for leukocytosis, neutrophil predominant. ?EKG with sinus bradycardia and right bundle branch block, no acute changes ? ? Chest CTA with abdomen and pelvic CT revealed the following: ?1. No evidence of pulmonary arterial embolus. ?2. Cardiomegaly without evidence of CHF. ?3. Aortic and coronary artery atherosclerosis.  No AAA. ?4. New finding of nondisplaced right posterolateral seventh and ?eighth rib fractures and displaced right posterolateral ninth rib ?fracture, trace right pleural effusion or hemothorax without ?pneumothorax, and increased opacity in the right lower lobe which ?could be atelectasis, contusions or pneumonia. ?5. 1.3 x 0.7 cm nodular opacity in the posterior basal left lower ?lobe, seen on the most recent exam but not on last year's study. ?Agree with prior recommendation for three-month follow-up ?noncontrast CT to ensure resolution. Reference: Radiology 2017; ?[id number]. ?6. Hiatal hernia with mild thickening of the distal thoracic ?esophagus. Correlate clinically for reflux esophagitis. ?7. There appear to be early cirrhotic  changes in the liver with ?steatosis, mildly prominent hepatic portal vein. There is no ?splenomegaly or ascites. ?8. Constipation, with terminal ileal fecal back up. No bowel ?obstruction or inflammation. Uncomplicated diverticulosis. ?9. Osteopenia, degenerative and DISH changes. No concerning regional ?bone lesion. ?10. Stranding in the right lateral chest and abdominal wall but no ?space-occupying hematoma, no free hemorrhage or free air.  ? ?Patient was started on ceftriaxone and Zithromax for concern of community-acquired pneumonia. ?Blood cultures remain negative. ?UA was not impressive for any infection. ? ?4/23: Check procalcitonin and it was negative.  Most likely SIRS response and leukocytosis secondary to these recent fractures.  No obvious source of infection found.  Sepsis ruled out. ?Imaging concerning for osteopenia, most likely pathologic fracture due to back cough. ?Right lower abdominal wall ecchymosis can also be related to small vessel ruptures with significant cough as there is no significant hematoma found. ?Patient need to get a bone density by PCP and started on osteoporosis management. ?Ceftriaxone was discontinued. ?We will continue Zithromax for its anti-inflammatory effect on lungs. ? ?Patient was given Norco and lidocaine patch for pain management. ?He was also given a prescription for Tussionex for control of his cough as it precipitates his pain. ? ?Physical therapist also evaluated him and there is no signs follow-up needed. ? ?He will continue with his current management and will follow-up with his primary care provider for further management. ?

## 2022-02-16 NOTE — Assessment & Plan Note (Addendum)
-   This involves the right seventh, eighth and ninth ribs. ?Concern of osteopenia and excessive cough resulted in rib fracture, no obvious trauma or injury. ?- Pain management will be provided with as needed IV morphine sulfate and Toradol. ?

## 2022-02-16 NOTE — Assessment & Plan Note (Deleted)
-   We will continue new Lexapro and Zyprexa. ?

## 2022-02-16 NOTE — Assessment & Plan Note (Addendum)
-   Continue with PPI 

## 2022-02-16 NOTE — Assessment & Plan Note (Signed)
-   This is partly pleuritic and partly related to his rib fractures. ?- Pain management to be provided with as needed IV Toradol. ?

## 2022-02-16 NOTE — Progress Notes (Signed)
Patient ECG HR in the high 40's, patient is not symptomatic. Provider Leslee Home notified no new orders given. Per pt his HR normally runs 50-60 bpm. Will continue to monitor patient. ?

## 2022-02-16 NOTE — Assessment & Plan Note (Deleted)
We will continue Klonopin  ?

## 2022-02-17 LAB — CBC
HCT: 38.6 % — ABNORMAL LOW (ref 39.0–52.0)
Hemoglobin: 12.6 g/dL — ABNORMAL LOW (ref 13.0–17.0)
MCH: 32.6 pg (ref 26.0–34.0)
MCHC: 32.6 g/dL (ref 30.0–36.0)
MCV: 100 fL (ref 80.0–100.0)
Platelets: 234 10*3/uL (ref 150–400)
RBC: 3.86 MIL/uL — ABNORMAL LOW (ref 4.22–5.81)
RDW: 13.5 % (ref 11.5–15.5)
WBC: 8.8 10*3/uL (ref 4.0–10.5)
nRBC: 0 % (ref 0.0–0.2)

## 2022-02-17 MED ORDER — LIDOCAINE 5 % EX PTCH: 1.0000 | MEDICATED_PATCH | Freq: Two times a day (BID) | CUTANEOUS | 0 refills | Status: AC

## 2022-02-17 MED ORDER — HYDROCOD POLI-CHLORPHE POLI ER 10-8 MG/5ML PO SUER: 5.0000 mL | Freq: Two times a day (BID) | ORAL | 0 refills | Status: AC | PRN

## 2022-02-17 MED ORDER — AZITHROMYCIN 250 MG PO TABS: ORAL_TABLET | ORAL | 0 refills | Status: DC

## 2022-02-17 MED ORDER — LIDOCAINE 5 % EX PTCH: 1.0000 | MEDICATED_PATCH | CUTANEOUS | 0 refills | Status: DC

## 2022-02-17 MED ORDER — HYDROCODONE-ACETAMINOPHEN 5-325 MG PO TABS: 1.0000 | ORAL_TABLET | Freq: Four times a day (QID) | ORAL | 0 refills | Status: DC | PRN

## 2022-02-17 NOTE — Evaluation (Signed)
Occupational Therapy Evaluation ?Patient Details ?Name: Jesus Mckinney ?MRN: [id number] ?DOB: December 12, 1955 ?Today's Date: 02/17/2022 ? ? ?History of Present Illness Jesus Mckinney is a 66 y.o. male with past medical history of hyperlipidemia and COPD who presents to the ED complaining of flank and chest pain.  Patient reports that he was recently admitted to the hospital for viral illness, COPD exacerbation, and right chest wall and flank pain attributed to his viral syndrome. CT abdomen 02/15/22: "New finding of nondisplaced right posterolateral seventh and  eighth rib fractures and displaced right posterolateral ninth rib  fracture."  ? ?Clinical Impression ?  ?Mr Cazier was seen for OT evaluation this date. Prior to hospital admission, pt was Independent for mobility and I/ADLs. Pt lives with spouse in home c level entry. Pt presents to acute OT demonstrating impaired ADL performance and functional mobility 2/2 decreased activity tolerance and functional ROM deficits. Pt currently requires MIN A don jacket and don socks in sitting/standing - limited by R flank pain. Functional reach task inside BOS with SUPERVISION and no LOBs. SUPERVISION toilet t/f at elevated commode height. MOD I pericare - increased time 2/2 pain. Plans for spouse to assist with dressing as needed until p[ain improves. Instructed in adapted dressing/bathing strategies and AE use - plan for long handled sponge use. All education complete, will sign off. Upon hospital discharge, recommend no OT follow up. ?  ?   ? ?Recommendations for follow up therapy are one component of a multi-disciplinary discharge planning process, led by the attending physician.  Recommendations may be updated based on patient status, additional functional criteria and insurance authorization.  ? ?Follow Up Recommendations ? No OT follow up  ?  ?Assistance Recommended at Discharge Intermittent Supervision/Assistance  ?Patient can return home with the following A little  help with bathing/dressing/bathroom;Help with stairs or ramp for entrance ? ?  ?Functional Status Assessment ? Patient has had a recent decline in their functional status and demonstrates the ability to make significant improvements in function in a reasonable and predictable amount of time.  ?Equipment Recommendations ? BSC/3in1  ?  ?Recommendations for Other Services   ? ? ?  ?Precautions / Restrictions Precautions ?Precautions: None ?Precaution Comments: R rib fractures 7-9 on R side ?Restrictions ?Weight Bearing Restrictions: No  ? ?  ? ?Mobility Bed Mobility ?  ?  ?  ?  ?  ?  ?  ?General bed mobility comments: pt received and left in chair - reports not lseeping in bed 2/2 pain ?  ? ?Transfers ?Overall transfer level: Needs assistance ?Equipment used: None ?Transfers: Sit to/from Stand ?Sit to Stand: Supervision ?  ?  ?  ?  ?  ?General transfer comment: cues for breathing and hand placement ?  ? ?  ?Balance Overall balance assessment: Needs assistance ?Sitting-balance support: No upper extremity supported, Feet supported ?Sitting balance-Leahy Scale: Good ?  ?  ?Standing balance support: No upper extremity supported, During functional activity ?Standing balance-Leahy Scale: Good ?  ?  ?  ?  ?  ?  ?  ?  ?  ?  ?  ?  ?   ? ?ADL either performed or assessed with clinical judgement  ? ?ADL Overall ADL's : Needs assistance/impaired ?  ?  ?  ?  ?  ?  ?  ?  ?  ?  ?  ?  ?  ?  ?  ?  ?  ?  ?  ?General ADL Comments: MIN A don jacket  and don socks in sitting.standing - limited by R flank pain. SUPERVISION toilet t/f at elevated commode height. MOD I pericare - increased time 2/2 pain  ? ? ? ? ?Pertinent Vitals/Pain Pain Assessment ?Pain Assessment: 0-10 ?Pain Score: 8  ?Pain Location: R rib cage ?Pain Descriptors / Indicators: Grimacing, Discomfort ?Pain Intervention(s): Limited activity within patient's tolerance, Repositioned  ? ? ? ?Hand Dominance Left ?  ?Extremity/Trunk Assessment Upper Extremity Assessment ?Upper  Extremity Assessment: Generalized weakness ?  ?Lower Extremity Assessment ?Lower Extremity Assessment: Overall WFL for tasks assessed ?  ?Cervical / Trunk Assessment ?Cervical / Trunk Assessment: Normal ?  ?Communication Communication ?Communication: No difficulties ?  ?Cognition Arousal/Alertness: Awake/alert ?Behavior During Therapy: [facility] for tasks assessed/performed ?Overall Cognitive Status: Within Functional Limits for tasks assessed ?  ?  ?  ?  ?  ?  ?  ?  ?  ?  ?  ?  ?  ?  ?  ?  ?  ?  ?  ?General Comments    ? ?  ?Exercises Other Exercises ?Other Exercises: instructed on IS and flutter valve ?  ?   ? ? ?Home Living Family/patient expects to be discharged to:: Private residence ?Living Arrangements: Spouse/significant other ?Available Help at Discharge: Family ?Type of Home: House ?Home Access: Level entry ?  ?  ?Home Layout: One level ?  ?  ?Bathroom Shower/Tub: Walk-in shower ?  ?Bathroom Toilet: Standard ?Bathroom Accessibility: Yes ?  ?Home Equipment: Shower seat - built in ?  ?  ?  ? ?  ?Prior Functioning/Environment Prior Level of Function : Independent/Modified Independent ?  ?  ?  ?  ?  ?  ?  ?  ?  ? ?  ?  ?OT Problem List: Decreased range of motion;Decreased activity tolerance;Decreased strength;Impaired balance (sitting and/or standing) ?  ?   ?   ?OT Goals(Current goals can be found in the care plan section) Acute Rehab OT Goals ?Patient Stated Goal: to improve pain ?OT Goal Formulation: With patient ?Time For Goal Achievement: 03/03/22 ?Potential to Achieve Goals: Good  ? ?AM-PAC OT "6 Clicks" Daily Activity     ?Outcome Measure Help from another person eating meals?: None ?Help from another person taking care of personal grooming?: None ?Help from another person toileting, which includes using toliet, bedpan, or urinal?: A Little ?Help from another person bathing (including washing, rinsing, drying)?: A Lot ?Help from another person to put on and taking off regular upper body clothing?: A  Little ?Help from another person to put on and taking off regular lower body clothing?: A Little ?6 Click Score: 19 ?  ?End of Session   ? ?Activity Tolerance: Patient tolerated treatment well ?Patient left: in chair;with call bell/phone within reach ? ?OT Visit Diagnosis: Unsteadiness on feet (R26.81)  ?              ?Time: 0630-1601 ?OT Time Calculation (min): 29 min ?Charges:  OT General Charges ?$OT Visit: 1 Visit ?OT Evaluation ?$OT Eval Low Complexity: 1 Low ?OT Treatments ?$Self Care/Home Management : 23-37 mins ? ?Kathie Dike, M.S. OTR/L  ?02/17/22, 10:04 AM  ?ascom [phone] ? ?

## 2022-02-17 NOTE — Evaluation (Signed)
Physical Therapy Evaluation ?Patient Details ?Name: Jesus Mckinney ?MRN: [id number] ?DOB: Oct 06, 1956 ?Today's Date: 02/17/2022 ? ?History of Present Illness ? Per MD note on 02/15/22: Jesus Mckinney is a 66 y.o. male with past medical history of hyperlipidemia and COPD who presents to the ED complaining of flank and chest pain.  Patient reports that he was recently admitted to the hospital for viral illness, COPD exacerbation, and right chest wall and flank pain attributed to his viral syndrome.  He was discharged from the hospital 6 days ago and states he was feeling better at that time, but since then has developed increasing pain in the same area as before. ?  ?Clinical Impression ? Pt admitted with above diagnosis. Pt received upright in bed agreeable to PT eval. Reports pain remains elevated at 7/10 at R ribs with medication management. Pt at baseline reports being independent with all aspects of mobility, ADL's/IADL's. Pt is slow moving due to pain but is mod-I with STS transfer with need for arm rests and independent in ambulation ~120' with reciprocal, step through gait although ambulating slowly but with adequate stability. Pt returned to seated in recliner with education provided on log roll technique, seated and supine positioning  and positioning with STS and stand to sit transfers for pain management, and use of incentive spirometer/flutter valve and upright sitting for pulmonary hygiene. Pt verbalizing understanding. All needs in reach. Pt near baseline, slow moving due to pain levels from rib fractures. Pt displaying no acute PT needs at this time or need for f/u PT recs. Pt to recommend 3 in 1 at d/c for pain management with transitional movements as pt reports having a low toilet at home. PT to sign off with all education completed. ?   ? ?Recommendations for follow up therapy are one component of a multi-disciplinary discharge planning process, led by the attending physician.  Recommendations may  be updated based on patient status, additional functional criteria and insurance authorization. ? ?Follow Up Recommendations No PT follow up ? ?  ?Assistance Recommended at Discharge PRN  ?Patient can return home with the following ? Assistance with cooking/housework;A little help with bathing/dressing/bathroom ? ?  ?Equipment Recommendations BSC/3in1  ?Recommendations for Other Services ?    ?  ?Functional Status Assessment Patient has had a recent decline in their functional status and demonstrates the ability to make significant improvements in function in a reasonable and predictable amount of time.  ? ?  ?Precautions / Restrictions Precautions ?Precautions: None ?Precaution Comments: R rib fractures 7-9 on R side ?Restrictions ?Weight Bearing Restrictions: No  ? ?  ? ?Mobility ? Bed Mobility ?  ?  ?  ?  ?  ?  ?  ?  ?Patient Response: Cooperative ? ?Transfers ?  ?  ?  ?  ?  ?  ?  ?  ?  ?General transfer comment: NT. In recliner pre and post session ?  ? ?Ambulation/Gait ?Ambulation/Gait assistance: Independent ?Gait Distance (Feet): 120 Feet ?Assistive device: None ?Gait Pattern/deviations: Decreased step length - right, Step-through pattern, Decreased step length - left ?  ?  ?  ?General Gait Details: Generally slow moving which pt attributes to pain. Reciprocal gait with overall good stability throughout. ? ?Stairs ?  ?  ?  ?  ?  ? ?Wheelchair Mobility ?  ? ?Modified Rankin (Stroke Patients Only) ?  ? ?  ? ?Balance Overall balance assessment: Needs assistance ?Sitting-balance support: No upper extremity supported, Feet supported ?Sitting balance-Leahy Scale: Good ?  ?  ?  ?  Standing balance-Leahy Scale: Good ?  ?  ?  ?  ?  ?  ?  ?  ?  ?  ?  ?  ?   ? ? ? ?Pertinent Vitals/Pain Pain Assessment ?Pain Assessment: 0-10 ?Pain Score: 7  ?Pain Location: R rib cage ?Pain Descriptors / Indicators: Grimacing, Discomfort ?Pain Intervention(s): Limited activity within patient's tolerance, Monitored during session  ? ? ?Home  Living Family/patient expects to be discharged to:: Private residence ?Living Arrangements: Spouse/significant other ?Available Help at Discharge: Family ?Type of Home: House ?Home Access: Level entry ?  ?  ?  ?Home Layout: One level ?Home Equipment: Shower seat - built in ?   ?  ?Prior Function Prior Level of Function : Independent/Modified Independent ?  ?  ?  ?  ?  ?  ?  ?  ?  ? ? ?Hand Dominance  ?   ? ?  ?Extremity/Trunk Assessment  ? Upper Extremity Assessment ?Upper Extremity Assessment: Overall WFL for tasks assessed ?  ? ?Lower Extremity Assessment ?Lower Extremity Assessment: Overall WFL for tasks assessed ?  ? ?Cervical / Trunk Assessment ?Cervical / Trunk Assessment: Normal  ?Communication  ? Communication: No difficulties  ?Cognition Arousal/Alertness: Awake/alert ?Behavior During Therapy: [facility] for tasks assessed/performed ?Overall Cognitive Status: Within Functional Limits for tasks assessed ?  ?  ?  ?  ?  ?  ?  ?  ?  ?  ?  ?  ?  ?  ?  ?  ?  ?  ?  ? ?  ?General Comments   ? ?  ?Exercises Other Exercises ?Other Exercises: Role of PT in acute setting, education on log roll technique, positioning to assist in STS and stand to sit transfers, use of flutter valve and incentive spirometer to assist in pulomanry hygiene.  ? ?Assessment/Plan  ?  ?PT Assessment Patient does not need any further PT services  ?PT Problem List   ? ?   ?  ?PT Treatment Interventions     ? ?PT Goals (Current goals can be found in the Care Plan section)  ?Acute Rehab PT Goals ?Patient Stated Goal: to improve pain ?PT Goal Formulation: With patient ?Time For Goal Achievement: 03/03/22 ?Potential to Achieve Goals: Good ? ?  ?Frequency   ?  ? ? ?Co-evaluation   ?  ?  ?  ?  ? ? ?  ?AM-PAC PT "6 Clicks" Mobility  ?Outcome Measure Help needed turning from your back to your side while in a flat bed without using bedrails?: A Little ?Help needed moving from lying on your back to sitting on the side of a flat bed without using bedrails?: A  Little ?Help needed moving to and from a bed to a chair (including a wheelchair)?: None ?Help needed standing up from a chair using your arms (e.g., wheelchair or bedside chair)?: A Little ?Help needed to walk in hospital room?: A Little ?Help needed climbing 3-5 steps with a railing? : A Little ?6 Click Score: 19 ? ?  ?End of Session   ?Activity Tolerance: Patient tolerated treatment well ?Patient left: in chair;with call bell/phone within reach ?Nurse Communication: Mobility status ?  ?  ? ?Time: 8469-6295 ?PT Time Calculation (min) (ACUTE ONLY): 13 min ? ? ?Charges:   PT Evaluation ?$PT Eval Low Complexity: 1 Low ?  ?  ?   ? ?Delphia Grates. Fairly IV, PT, DPT ?Physical Therapist- [place]  ?[facility]  ?02/17/2022, 9:42 AM ? ?

## 2022-02-17 NOTE — Discharge Summary (Signed)
?Physician Discharge Summary ?  ?Patient: Jesus Mckinney MRN: [id number] DOB: [birth year]  ?Admit date:     02/15/2022  ?Discharge date: 02/17/22  ?Discharge Physician: Arnetha Courser  ? ?PCP: Pcp, No  ? ?Recommendations at discharge:  ?Follow-up with primary care provider within a week ?Patient need DEXA scan for osteoporosis as he has pathologic rib fractures with forceful coughing. ? ?Discharge Diagnoses: ?Principal Problem: ?  Sepsis due to pneumonia [facility]) ?Active Problems: ?  Pleuritic chest pain ?  CAP (community acquired pneumonia) ?  Rib fractures ?  Chronic obstructive pulmonary disease (COPD) (HCC) ?  Abdominal wall contusion ?  Essential hypertension ?  GERD without esophagitis ?  Dyslipidemia ? ? ?Hospital Course: ?Taken from H&P. ? ?Jesus Mckinney is a 66 y.o. Caucasian male with medical history significant for dyslipidemia and COPD, who presented to the emergency room with acute onset of worsening right-sided chest pain extending to his flank.  This pain has been significantly worsening with cough or deep breathing.  He denied any falls or trauma.  He was recently admitted here for COPD exacerbation and suspected viral illness with parainfluenza bronchitis as well as hyponatremia with associated hypoxemia from 02/07/2022 to 02/09/2022.  He was discharged on as needed Tussionex, scheduled Mucinex, as needed ibuprofen, prednisone and Augmentin.  He stated that he just finished Augmentin yesterday.  He denies any fever or chills.  No nausea or vomiting or abdominal pain.  No dysuria, oliguria or hematuria. ? ?On presentation to ED he was hemodynamically stable, labs only pertinent for leukocytosis, neutrophil predominant. ?EKG with sinus bradycardia and right bundle branch block, no acute changes ? ? Chest CTA with abdomen and pelvic CT revealed the following: ?1. No evidence of pulmonary arterial embolus. ?2. Cardiomegaly without evidence of CHF. ?3. Aortic and coronary artery atherosclerosis.  No AAA. ?4.  New finding of nondisplaced right posterolateral seventh and ?eighth rib fractures and displaced right posterolateral ninth rib ?fracture, trace right pleural effusion or hemothorax without ?pneumothorax, and increased opacity in the right lower lobe which ?could be atelectasis, contusions or pneumonia. ?5. 1.3 x 0.7 cm nodular opacity in the posterior basal left lower ?lobe, seen on the most recent exam but not on last year's study. ?Agree with prior recommendation for three-month follow-up ?noncontrast CT to ensure resolution. Reference: Radiology 2017; ?[id number]. ?6. Hiatal hernia with mild thickening of the distal thoracic ?esophagus. Correlate clinically for reflux esophagitis. ?7. There appear to be early cirrhotic changes in the liver with ?steatosis, mildly prominent hepatic portal vein. There is no ?splenomegaly or ascites. ?8. Constipation, with terminal ileal fecal back up. No bowel ?obstruction or inflammation. Uncomplicated diverticulosis. ?9. Osteopenia, degenerative and DISH changes. No concerning regional ?bone lesion. ?10. Stranding in the right lateral chest and abdominal wall but no ?space-occupying hematoma, no free hemorrhage or free air.  ? ?Patient was started on ceftriaxone and Zithromax for concern of community-acquired pneumonia. ?Blood cultures remain negative. ?UA was not impressive for any infection. ? ?4/23: Check procalcitonin and it was negative.  Most likely SIRS response and leukocytosis secondary to these recent fractures.  No obvious source of infection found.  Sepsis ruled out. ?Imaging concerning for osteopenia, most likely pathologic fracture due to back cough. ?Right lower abdominal wall ecchymosis can also be related to small vessel ruptures with significant cough as there is no significant hematoma found. ?Patient need to get a bone density by PCP and started on osteoporosis management. ?Ceftriaxone was discontinued. ?We will continue Zithromax for its  anti-inflammatory  effect on lungs. ? ?Patient was given Norco and lidocaine patch for pain management. ?He was also given a prescription for Tussionex for control of his cough as it precipitates his pain. ? ?Physical therapist also evaluated him and there is no signs follow-up needed. ? ?He will continue with his current management and will follow-up with his primary care provider for further management. ? ? ?Assessment and Plan: ?* Sepsis due to pneumonia [facility]) ?Sepsis ruled out.  No obvious source of infection.  Most likely SIRS response with rib fractures.  Discontinuing ceftriaxone.  Procalcitonin negative. ?-Continue with Zithromax for its pulmonary anti-inflammatory effect. ?-Continue with pain management ?-Continue with supportive care for extensive cough. ?- Bronchodilator therapy will be provided with DuoNebs on a scheduled and as needed basis. ? ? ?Pleuritic chest pain ?- This is partly pleuritic and partly related to his rib fractures. ?- Pain management to be provided with as needed IV Toradol. ? ?CAP (community acquired pneumonia) ?Management as above. ? ?Rib fractures ?- This involves the right seventh, eighth and ninth ribs. ?Concern of osteopenia and excessive cough resulted in rib fracture, no obvious trauma or injury. ?- Pain management will be provided with as needed IV morphine sulfate and Toradol. ? ?Chronic obstructive pulmonary disease (COPD) ([place]) ?- We will place him on scheduled and as needed DuoNebs. ?- We will continue his Pulmicort. ? ?Abdominal wall contusion ?-It is currently asymptomatic.  Most likely secondary to superficial blood vessel rupture with coughing, no history of any trauma or fall.  No hematoma on images ?- Pain management will be provided as needed. ?- We will monitor its size. ? ?Essential hypertension ?Blood pressure within goal. ?- We will continue to atenolol. ? ?GERD without esophagitis ?- Continue with PPI. ? ?Dyslipidemia ?- We will continue statin therapy. ? ? ?Pain control -  [employer] Controlled Substance Reporting System database was reviewed. and patient was instructed, not to drive, operate heavy machinery, perform activities at heights, swimming or participation in water activities or provide baby-sitting services while on Pain, Sleep and Anxiety Medications; until their outpatient Physician has advised to do so again. Also recommended to not to take more than prescribed Pain, Sleep and Anxiety Medications.  ?Consultants: None ?Procedures performed: None ?Disposition: Home ?Diet recommendation:  ?Discharge Diet Orders (From admission, onward)  ? ?  Start     Ordered  ? 02/17/22 0000  Diet - low sodium heart healthy       ? 02/17/22 1100  ? ?  ?  ? ?  ? ?Cardiac diet ?DISCHARGE MEDICATION: ?Allergies as of 02/17/2022   ? ?   Reactions  ? Dust Mite Extract Cough  ? Gramineae Pollens Cough  ? ?  ? ?  ?Medication List  ?  ? ?STOP taking these medications   ? ?benzonatate 100 MG capsule ?Commonly known as: Best boy ?  ?predniSONE 5 MG (21) Tbpk tablet ?Commonly known as: STERAPRED UNI-PAK 21 TAB ?  ? ?  ? ?TAKE these medications   ? ?Advair HFA 230-21 MCG/ACT inhaler ?Generic drug: fluticasone-salmeterol ?Inhale 2 puffs into the lungs 2 (two) times daily. ?  ?albuterol 108 (90 Base) MCG/ACT inhaler ?Commonly known as: VENTOLIN HFA ?Inhale 1-2 puffs into the lungs every 6 (six) hours as needed. ?  ?albuterol 1.25 MG/3ML nebulizer solution ?Commonly known as: ACCUNEB ?Take 3 mLs (1.25 mg total) by nebulization every 4 (four) hours as needed for wheezing or shortness of breath. ?  ?aspirin 81 MG EC tablet ?  Take 81 mg by mouth daily. ?  ?atenolol 25 MG tablet ?Commonly known as: TENORMIN ?Take 25 mg by mouth daily. ?  ?atorvastatin 40 MG tablet ?Commonly known as: LIPITOR ?Take 40 mg by mouth daily. ?  ?azithromycin 250 MG tablet ?Commonly known as: ZITHROMAX ?Take 1 tablet daily for the next 3 days ?  ?chlorpheniramine-HYDROcodone 10-8 MG/5ML ?Commonly known as: Tussionex  Pennkinetic ER ?Take 5 mLs by mouth every 12 (twelve) hours as needed for up to 15 days for cough. ?  ?donepezil 5 MG tablet ?Commonly known as: ARICEPT ?Take 5 mg by mouth daily. ?  ?escitalopram 20 MG tablet ?C

## 2022-02-17 NOTE — TOC Transition Note (Signed)
Transition of Care (TOC) - CM/SW Discharge Note ? ? ?Patient Details  ?Name: Jesus Mckinney ?MRN: [id number] ?Date of Birth: [birth year] ? ?Transition of Care (TOC) CM/SW Contact:  ?Candie Chroman, LCSW ?Phone Number: ?02/17/2022, 11:17 AM ? ? ?Clinical Narrative:  Patient has orders to discharge home today. Readmission prevention screen complete. CSW met with patient. No supports at bedside. CSW introduced role and explained that discharge planning would be discussed. Patient lives home with his wife. His PCP was Dr. Garnette Scheuermann at [facility] but she is on leave so he is being transitioned to Dr. Ola Spurr at [facility]. He has an appointment to establish care on May 30. He drives himself to appointments. Patient typically gets his medications delivered from [facility] but gets his medications at [facility] if needed. No issues obtaining medications. No home health prior to admission. PT/OT recommending a 3-in-1 for pain management. Told patient this would be a private pay item. He will discuss with his wife and let RN know if he wants it. His wife will drive him home today.   ? ?Final next level of care: Home/Self Care ?Barriers to Discharge: No Barriers Identified ? ? ?Patient Goals and CMS Choice ?  ?  ?  ? ?Discharge Placement ?  ?           ?  ?Patient to be transferred to facility by: Wife ?  ?Patient and family notified of of transfer: 02/17/22 ? ?Discharge Plan and Services ?In-house Referral: Clinical Social Work ?  ?Post Acute Care Choice: Home Health          ?  ?  ?  ?  ?  ?  ?  ?  ?  ?  ? ?Social Determinants of Health (SDOH) Interventions ?  ? ? ?Readmission Risk Interventions ? ?  02/06/2022  ? 10:08 AM  ?Readmission Risk Prevention Plan  ?Transportation Screening Complete  ?PCP or Specialist Appt within 3-5 Days Complete  ?[place] or Home Care Consult Complete  ?Social Work Consult for [place] Planning/Counseling Complete  ?Palliative Care Screening Not Applicable   ?Medication Review Press photographer) Referral to Pharmacy  ? ? ? ? ? ?

## 2022-02-21 LAB — CULTURE, BLOOD (ROUTINE X 2)
Culture: NO GROWTH
Culture: NO GROWTH
Special Requests: ADEQUATE
Special Requests: ADEQUATE

## 2022-02-26 ENCOUNTER — Ambulatory Visit: Payer: Medicare HMO

## 2022-03-26 ENCOUNTER — Ambulatory Visit: Payer: Medicare HMO | Attending: Pulmonary Disease

## 2022-03-26 DIAGNOSIS — Z87891 Personal history of nicotine dependence: Secondary | ICD-10-CM | POA: Insufficient documentation

## 2022-03-26 DIAGNOSIS — R0609 Other forms of dyspnea: Secondary | ICD-10-CM | POA: Insufficient documentation

## 2022-03-26 DIAGNOSIS — J4550 Severe persistent asthma, uncomplicated: Secondary | ICD-10-CM

## 2022-03-26 DIAGNOSIS — Z7951 Long term (current) use of inhaled steroids: Secondary | ICD-10-CM | POA: Insufficient documentation

## 2022-03-26 MED ORDER — METHACHOLINE 4 MG/ML NEB SOLN
3.0000 mL | Freq: Once | RESPIRATORY_TRACT | Status: AC
  Administered 2022-03-26: 12 mg via RESPIRATORY_TRACT
  Filled 2022-03-26: qty 8

## 2022-03-26 MED ORDER — ALBUTEROL SULFATE (2.5 MG/3ML) 0.083% IN NEBU
2.5000 mg | INHALATION_SOLUTION | Freq: Once | RESPIRATORY_TRACT | Status: AC
Start: 2022-03-26 — End: 2022-03-26
  Administered 2022-03-26: 2.5 mg via RESPIRATORY_TRACT
  Filled 2022-03-26: qty 3

## 2022-03-26 MED ORDER — METHACHOLINE 16 MG/ML NEB SOLN
3.0000 mL | Freq: Once | RESPIRATORY_TRACT | Status: AC
  Administered 2022-03-26: 48 mg via RESPIRATORY_TRACT
  Filled 2022-03-26: qty 8

## 2022-03-26 MED ORDER — SODIUM CHLORIDE 0.9 % IN NEBU
3.0000 mL | INHALATION_SOLUTION | Freq: Once | RESPIRATORY_TRACT | Status: AC
  Administered 2022-03-26: 3 mL via RESPIRATORY_TRACT

## 2022-03-26 MED ORDER — METHACHOLINE 0.0625 MG/ML NEB SOLN
3.0000 mL | Freq: Once | RESPIRATORY_TRACT | Status: AC
  Administered 2022-03-26: 0.1875 mg via RESPIRATORY_TRACT
  Filled 2022-03-26: qty 8

## 2022-03-26 MED ORDER — METHACHOLINE 1 MG/ML NEB SOLN
3.0000 mL | Freq: Once | RESPIRATORY_TRACT | Status: AC
  Administered 2022-03-26: 3 mg via RESPIRATORY_TRACT
  Filled 2022-03-26: qty 8

## 2022-03-26 MED ORDER — METHACHOLINE 0.25 MG/ML NEB SOLN
3.0000 mL | Freq: Once | RESPIRATORY_TRACT | Status: AC
  Administered 2022-03-26: 0.75 mg via RESPIRATORY_TRACT
  Filled 2022-03-26: qty 8

## 2022-05-22 LAB — PULMONARY FUNCTION TEST ARMC ONLY
FEF 25-75 Post: 1.39 L/sec
FEF 25-75 Pre: 3.11 L/sec
FEF2575-%Change-Post: -55 %
FEF2575-%Pred-Post: 62 %
FEF2575-%Pred-Pre: 139 %
FEV1-%Change-Post: -13 %
FEV1-%Pred-Post: 91 %
FEV1-%Pred-Pre: 105 %
FEV1-Post: 2.55 L
FEV1-Pre: 2.94 L
FEV1FVC-%Change-Post: -7 %
FEV1FVC-%Pred-Pre: 110 %
FEV6-%Change-Post: -9 %
FEV6-%Pred-Post: 91 %
FEV6-%Pred-Pre: 100 %
FEV6-Post: 3.23 L
FEV6-Pre: 3.57 L
FEV6FVC-%Change-Post: -1 %
FEV6FVC-%Pred-Post: 103 %
FEV6FVC-%Pred-Pre: 105 %
FVC-%Change-Post: -5 %
FVC-%Pred-Post: 89 %
FVC-%Pred-Pre: 95 %
FVC-Post: 3.37 L
FVC-Pre: 3.58 L
Post FEV1/FVC ratio: 76 %
Post FEV6/FVC ratio: 98 %
Pre FEV1/FVC ratio: 82 %
Pre FEV6/FVC Ratio: 100 %

## 2022-09-13 ENCOUNTER — Other Ambulatory Visit: Payer: Self-pay

## 2022-09-13 ENCOUNTER — Emergency Department
Admission: EM | Admit: 2022-09-13 | Discharge: 2022-09-13 | Disposition: A | Discharge status: Home / Self Care | Payer: Medicare HMO | Attending: Emergency Medicine | Admitting: Emergency Medicine

## 2022-09-13 DIAGNOSIS — J45909 Unspecified asthma, uncomplicated: Secondary | ICD-10-CM | POA: Insufficient documentation

## 2022-09-13 DIAGNOSIS — L299 Pruritus, unspecified: Secondary | ICD-10-CM

## 2022-09-13 DIAGNOSIS — I10 Essential (primary) hypertension: Secondary | ICD-10-CM | POA: Diagnosis not present

## 2022-09-13 DIAGNOSIS — Z96659 Presence of unspecified artificial knee joint: Secondary | ICD-10-CM | POA: Diagnosis not present

## 2022-09-13 DIAGNOSIS — J449 Chronic obstructive pulmonary disease, unspecified: Secondary | ICD-10-CM | POA: Diagnosis not present

## 2022-09-13 DIAGNOSIS — Z7951 Long term (current) use of inhaled steroids: Secondary | ICD-10-CM | POA: Diagnosis not present

## 2022-09-13 DIAGNOSIS — E8729 Other acidosis: Secondary | ICD-10-CM

## 2022-09-13 DIAGNOSIS — Z7982 Long term (current) use of aspirin: Secondary | ICD-10-CM | POA: Diagnosis not present

## 2022-09-13 DIAGNOSIS — Z79899 Other long term (current) drug therapy: Secondary | ICD-10-CM | POA: Diagnosis not present

## 2022-09-13 DIAGNOSIS — E8721 Acute metabolic acidosis: Secondary | ICD-10-CM | POA: Insufficient documentation

## 2022-09-13 LAB — CBC WITH DIFFERENTIAL/PLATELET
Abs Immature Granulocytes: 0.03 10*3/uL (ref 0.00–0.07)
Basophils Absolute: 0.1 10*3/uL (ref 0.0–0.1)
Basophils Relative: 1 %
Eosinophils Absolute: 0.1 10*3/uL (ref 0.0–0.5)
Eosinophils Relative: 2 %
HCT: 44.2 % (ref 39.0–52.0)
Hemoglobin: 14.3 g/dL (ref 13.0–17.0)
Immature Granulocytes: 1 %
Lymphocytes Relative: 28 %
Lymphs Abs: 1.7 10*3/uL (ref 0.7–4.0)
MCH: 32.2 pg (ref 26.0–34.0)
MCHC: 32.4 g/dL (ref 30.0–36.0)
MCV: 99.5 fL (ref 80.0–100.0)
Monocytes Absolute: 0.8 10*3/uL (ref 0.1–1.0)
Monocytes Relative: 13 %
Neutro Abs: 3.4 10*3/uL (ref 1.7–7.7)
Neutrophils Relative %: 55 %
Platelets: 227 10*3/uL (ref 150–400)
RBC: 4.44 MIL/uL (ref 4.22–5.81)
RDW: 13.2 % (ref 11.5–15.5)
WBC: 6 10*3/uL (ref 4.0–10.5)
nRBC: 0 % (ref 0.0–0.2)

## 2022-09-13 LAB — COMPREHENSIVE METABOLIC PANEL
ALT: 25 U/L (ref 0–44)
AST: 31 U/L (ref 15–41)
Albumin: 3.9 g/dL (ref 3.5–5.0)
Alkaline Phosphatase: 70 U/L (ref 38–126)
Anion gap: 7 (ref 5–15)
BUN: 15 mg/dL (ref 8–23)
CO2: 16 mmol/L — ABNORMAL LOW (ref 22–32)
Calcium: 8.7 mg/dL — ABNORMAL LOW (ref 8.9–10.3)
Chloride: 113 mmol/L — ABNORMAL HIGH (ref 98–111)
Creatinine, Ser: 1.09 mg/dL (ref 0.61–1.24)
GFR, Estimated: >60 mL/min (ref >60)
Glucose, Bld: 96 mg/dL (ref 70–99)
Potassium: 4.4 mmol/L (ref 3.5–5.1)
Sodium: 136 mmol/L (ref 135–145)
Total Bilirubin: 1 mg/dL (ref 0.3–1.2)
Total Protein: 7.2 g/dL (ref 6.5–8.1)

## 2022-09-13 LAB — BLOOD GAS, VENOUS
Acid-base deficit: 8.9 mmol/L — ABNORMAL HIGH (ref 0.0–2.0)
Bicarbonate: 16 mmol/L — ABNORMAL LOW (ref 20.0–28.0)
O2 Saturation: 88.1 %
Patient temperature: 37
pCO2, Ven: 31 mmHg — ABNORMAL LOW (ref 44–60)
pH, Ven: 7.32 (ref 7.25–7.43)
pO2, Ven: 57 mmHg — ABNORMAL HIGH (ref 32–45)

## 2022-09-13 MED ORDER — LACTATED RINGERS IV BOLUS
1000.0000 mL | Freq: Once | INTRAVENOUS | Status: AC
  Administered 2022-09-13: 1000 mL via INTRAVENOUS

## 2022-09-13 MED ORDER — DIPHENHYDRAMINE HCL 25 MG PO CAPS
25.0000 mg | ORAL_CAPSULE | Freq: Once | ORAL | Status: AC
  Administered 2022-09-13: 25 mg via ORAL
  Filled 2022-09-13: qty 1

## 2022-09-13 NOTE — Discharge Instructions (Addendum)
Your lab work was reassuring today and showed no signs of causes for itching.  Your labs showed an elevated chloride level and decreased bicarbonate level called a hyperchloremic metabolic acidosis.  This is not emergent but should be followed by your primary care doctor as an outpatient.  I recommend repeat blood work next week.  I do not think this is the cause of your itching.  I would talk to your doctor about side effects of Plaquenil and Lamictal given these are your only recent changes.  You may use over-the-counter Benadryl as needed for itching.  Please return the emergency department if you begin having chest pain, shortness of breath, lip or tongue swelling, difficulty speaking, vomiting that does not stop, diarrhea, confusion, weakness, numbness.

## 2022-09-13 NOTE — ED Provider Notes (Signed)
[facility] Provider Note    Event Date/Time   First MD Initiated Contact with Patient 09/13/22 [phone]     (approximate)   History   Pruritis   HPI  Jesus Mckinney is a 66 y.o. male with asthma, emphysema, SVT, hyperlipidemia, hypertension, Sjogren's syndrome on Plaquenil who presents to the emergency department with complaints of itching and tingling to his hands and feet that started 3 days ago.  No rash or swelling.  States that he just had his Lamictal dose increased and started Plaquenil 2 weeks ago.  Denies any other new exposures.  No lip or tongue swelling, chest pain or shortness of breath, fever.  No yellowing of his skin or eyes.   History provided by patient.    Past Medical History:  Diagnosis Date   COPD (chronic obstructive pulmonary disease) (HCC)    Emphysema lung (HCC)     Past Surgical History:  Procedure Laterality Date   JOINT REPLACEMENT      MEDICATIONS:  Prior to Admission medications   Medication Sig Start Date End Date Taking? Authorizing Provider  albuterol (ACCUNEB) 1.25 MG/3ML nebulizer solution Take 3 mLs (1.25 mg total) by nebulization every 4 (four) hours as needed for wheezing or shortness of breath. 02/09/22 02/09/23  Spongberg, Susy Frizzle, MD  albuterol (VENTOLIN HFA) 108 (90 Base) MCG/ACT inhaler Inhale 1-2 puffs into the lungs every 6 (six) hours as needed. Patient not taking: Reported on 02/15/2022 08/13/21   [provider]  aspirin 81 MG EC tablet Take 81 mg by mouth daily.    [provider]  atenolol (TENORMIN) 25 MG tablet Take 25 mg by mouth daily. 01/30/22   [provider]  atorvastatin (LIPITOR) 40 MG tablet Take 40 mg by mouth daily. 06/19/21   [provider]  azithromycin (ZITHROMAX) 250 MG tablet Take 1 tablet daily for the next 3 days 02/17/22   Arnetha Courser, MD  donepezil (ARICEPT) 5 MG tablet Take 5 mg by mouth daily. Patient not taking: Reported on 02/15/2022  01/27/22   [provider]  escitalopram (LEXAPRO) 20 MG tablet Take 20 mg by mouth daily. 06/10/21   [provider]  ezetimibe (ZETIA) 10 MG tablet Take 10 mg by mouth daily. 01/11/22   [provider]  fluticasone-salmeterol (ADVAIR HFA) 230-21 MCG/ACT inhaler Inhale 2 puffs into the lungs 2 (two) times daily. 08/22/20   [provider]  HYDROcodone-acetaminophen (NORCO) 5-325 MG tablet Take 1 tablet by mouth every 6 (six) hours as needed for moderate pain. 02/17/22   Arnetha Courser, MD  ibuprofen (ADVIL) 200 MG tablet Take 2 tablets (400 mg total) by mouth every 8 (eight) hours as needed for mild pain or moderate pain. 02/06/22   Lewie Chamber, MD  ipratropium-albuterol (DUONEB) 0.5-2.5 (3) MG/3ML SOLN Take 3 mLs by nebulization every 6 (six) hours as needed. 02/06/22   Lewie Chamber, MD  lamoTRIgine (LAMICTAL) 200 MG tablet Take 200 mg by mouth daily. 06/04/21   [provider]  lidocaine (LIDODERM) 5 % Place 1 patch onto the skin daily. Remove & Discard patch within 12 hours or as directed by MD 02/17/22   Arnetha Courser, MD  lidocaine (LIDODERM) 5 % Place 1 patch onto the skin every 12 (twelve) hours. Remove & Discard patch within 12 hours or as directed by MD 02/17/22 02/17/23  Arnetha Courser, MD  OLANZapine (ZYPREXA) 2.5 MG tablet Take 1.25 mg by mouth daily. 06/10/21   [provider]  omeprazole (  PRILOSEC) 40 MG capsule Take 40 mg by mouth daily. 06/20/21   [provider]    Physical Exam   Triage Vital Signs: ED Triage Vitals [09/13/22 0422]  Enc Vitals Group     BP (!) 145/80     Pulse Rate (!) 51     Resp 16     Temp 97.6 F (36.4 C)     Temp Source Oral     SpO2 95 %     Weight 220 lb (99.8 kg)     Height 5\' 5"  (1.651 m)     Head Circumference      Peak Flow      Pain Score 0     Pain Loc      Pain Edu?      Excl. in GC?     Most recent vital signs: Vitals:   09/13/22 0422  BP: (!) 145/80  Pulse: (!) 51  Resp: 16   Temp: 97.6 F (36.4 C)  SpO2: 95%    CONSTITUTIONAL: Alert and oriented and responds appropriately to questions. Well-appearing; well-nourished HEAD: Normocephalic, atraumatic EYES: Conjunctivae clear, pupils appear equal, sclera nonicteric ENT: normal nose; moist mucous membranes NECK: Supple, normal ROM CARD: RRR; S1 and S2 appreciated; no murmurs, no clicks, no rubs, no gallops RESP: Normal chest excursion without splinting or tachypnea; breath sounds clear and equal bilaterally; no wheezes, no rhonchi, no rales, no hypoxia or respiratory distress, speaking full sentences ABD/GI: Normal bowel sounds; non-distended; soft, non-tender, no rebound, no guarding, no peritoneal signs BACK: The back appears normal EXT: Normal ROM in all joints; no deformity noted, no edema; no cyanosis hands and feet appear normal without swelling, rash.  2+ radial and DP pulses bilaterally.  No peripheral edema noted.  Compartments soft. SKIN: Normal color for age and race; warm; no rash on exposed skin, no urticaria NEURO: Moves all extremities equally, normal speech PSYCH: The patient's mood and manner are appropriate.   ED Results / Procedures / Treatments   LABS: (all labs ordered are listed, but only abnormal results are displayed) Labs Reviewed  COMPREHENSIVE METABOLIC PANEL - Abnormal; Notable for the following components:      Result Value   Chloride 113 (*)    CO2 16 (*)    Calcium 8.7 (*)    All other components within normal limits  CBC WITH DIFFERENTIAL/PLATELET  BLOOD GAS, VENOUS     EKG:     RADIOLOGY: My personal review and interpretation of imaging:    I have personally reviewed all radiology reports.   No results found.   PROCEDURES:  Critical Care performed: No    Procedures    IMPRESSION / MDM / ASSESSMENT AND PLAN / ED COURSE  I reviewed the triage vital signs and the nursing notes.    Patient here with pruritus of his hands and  feet.     DIFFERENTIAL DIAGNOSIS (includes but not limited to):   Allergies, neuropathy, low suspicion for liver failure with elevated total bilirubin but no signs of infectious etiology, arterial obstruction   Patient's presentation is most consistent with acute complicated illness / injury requiring diagnostic workup.   PLAN: We will obtain CBC, CMP.  Will give Benadryl for symptomatic relief.   MEDICATIONS GIVEN IN ED: Medications  lactated ringers bolus 1,000 mL (has no administration in time range)  diphenhydrAMINE (BENADRYL) capsule 25 mg (25 mg Oral Given 09/13/22 0612)     ED COURSE: Patient's labs show no leukocytosis.  Normal hemoglobin.  His LFTs today are normal.  Glucose also normal.  Recommended a hemoglobin A1c as an outpatient.    Patient has a hyperchloremic metabolic acidosis with normal anion gap.  Normal renal function.  This appears new for him.  He is not on any ACE inhibitors, ARBs or diuretics.  He denies any diarrhea.  No known history of any kidney dysfunction.  I have reviewed his medications and I am not aware of this being a complication with any of his medications.  I do not believe this is the cause of his pruritus today.  We discussed that this could be neuropathy from diabetes and recommended close follow-up with his PCP.  Will obtain VBG today and give a liter of lactated Ringer's.  No indication to give bicarb at this time unless pH is significantly abnormal.  If VBG is reassuring, anticipate discharge home with close PCP follow-up.  Patient is also comfortable with this plan.  Recommended over-the-counter Benadryl as needed for itching.  Discussed return precautions at length.   CONSULTS: Admission considered for hyperchloremic metabolic acidosis but no respiratory distress, altered mental status, neurologic deficits, renal failure, diarrhea, signs of dehydration.  Patient has reassuring vital signs here.  As long as pH is reassuring, anticipate discharge  home with close outpatient follow-up.   OUTSIDE RECORDS REVIEWED: Reviewed patient's last PCP note on 05/29/2022 and last pulmonology note on 08/28/2022.       FINAL CLINICAL IMPRESSION(S) / ED DIAGNOSES   Final diagnoses:  Pruritus  Hyperchloremic metabolic acidosis     Rx / DC Orders   ED Discharge Orders     None        Note:  This document was prepared using Dragon voice recognition software and may include unintentional dictation errors.   Shaterrica Territo, Layla Maw, DO 09/13/22 [phone]

## 2022-09-13 NOTE — ED Triage Notes (Addendum)
Pt states he believe he is having a reaction to medication. Pt states he has been having itching in hands and feet. Pt appears in no acute distress. Pt states itching has been present for "weeks"

## 2022-09-13 NOTE — ED Provider Notes (Signed)
Patient was signed out to me pending a VBG.  In brief this is a 66 year old male who presents because of bilateral tingling and itching in hands and feet.  His CMP is notable for low bicarb at 16 but no anion gap.  VBG obtained shows pH 732.  Patient's results have been discussed with him already and plan is for him to follow-up with PCP may need further work-up for nongap acidosis as an outpatient but low suspicion that this is causing his symptoms today.   Georga Hacking, MD 09/13/22 [phone]

## 2022-12-02 ENCOUNTER — Ambulatory Visit: Payer: Medicare HMO | Admitting: Podiatry

## 2022-12-02 ENCOUNTER — Ambulatory Visit (INDEPENDENT_AMBULATORY_CARE_PROVIDER_SITE_OTHER): Payer: Medicare HMO

## 2022-12-02 ENCOUNTER — Encounter: Payer: Self-pay | Admitting: Podiatry

## 2022-12-02 VITALS — BP 126/63 | HR 54

## 2022-12-02 DIAGNOSIS — M722 Plantar fascial fibromatosis: Secondary | ICD-10-CM | POA: Diagnosis not present

## 2022-12-02 DIAGNOSIS — R52 Pain, unspecified: Secondary | ICD-10-CM

## 2022-12-02 DIAGNOSIS — L600 Ingrowing nail: Secondary | ICD-10-CM

## 2022-12-02 MED ORDER — METHYLPREDNISOLONE 4 MG PO TBPK: ORAL_TABLET | ORAL | 0 refills | Status: DC

## 2022-12-02 MED ORDER — BETAMETHASONE SOD PHOS & ACET 6 (3-3) MG/ML IJ SUSP
3.0000 mg | Freq: Once | INTRAMUSCULAR | Status: AC
  Administered 2022-12-02: 3 mg via INTRA_ARTICULAR

## 2022-12-02 MED ORDER — MELOXICAM 15 MG PO TABS: 15.0000 mg | ORAL_TABLET | Freq: Every day | ORAL | 1 refills | Status: DC

## 2022-12-02 NOTE — Progress Notes (Signed)
Chief Complaint  Patient presents with   Foot Pain    "I'm having a problem with my right heel." N - heel pain L  - right D - 3 weeks O - suddenly, gotten worse C - sharp pain A - walking, house on concrete slab T - none   Nail Problem    "My big toenail is coming off, it's loose." N - toenail loose L - hallux left D - 3-4 mos. O - gradually worse C - falls off, black,  A - none T - none    Subjective: 67 y.o. male presenting today for 2 separate new complaints.  First the patient states that he has been experiencing right heel pain for about 3-4 months now.  Gradual onset.  Denies a history of injury.  He cannot recall any change in activity or shoe gear.  Patient also states that he has a very loose toenail to the left hallux nail plate.  It is sensitive and tender and he is concerned that it may come off.  He does have a history of injuring the toenail several months prior.   Past Medical History:  Diagnosis Date   COPD (chronic obstructive pulmonary disease) ([place])    Emphysema lung ([place])    Past Surgical History:  Procedure Laterality Date   JOINT REPLACEMENT     Allergies  Allergen Reactions   Dust Mite Extract Cough   Gramineae Pollens Cough     Objective: Physical Exam General: The patient is alert and oriented x3 in no acute distress.  Dermatology: Skin is warm, dry and supple bilateral lower extremities. Negative for open lesions or macerations bilateral.  Dystrophic loosely adhered nail plate noted to the left hallux nail plate  Vascular: Dorsalis Pedis and Posterior Tibial pulses palpable bilateral.  Capillary fill time is immediate to all digits.  Neurological: Grossly intact via light touch  Musculoskeletal: Tenderness to palpation to the plantar aspect of the right heel along the plantar fascia. All other joints range of motion within normal limits bilateral. Strength 5/5 in all groups bilateral.   Radiographic exam RT foot: Normal osseous  mineralization. Joint spaces preserved.  Plantar heel spur noted on lateral view no other soft tissue abnormalities or radiopaque foreign bodies.   Assessment/plan of care: 1. Plantar fasciitis right  -Patient evaluated. Xrays reviewed.   -Injection of 0.5cc Celestone soluspan injected into the right plantar fascia  -Rx for Medrol Dose Pack placed -Rx for Meloxicam ordered for patient. -Plantar fascial band(s) dispensed to wear daily -Advised against going barefoot even around the house.  Recommend good supportive shoes and sneakers  2.  Loosely adhered dystrophic nail left hallux nail plate  -The nail is loosely adhered all the way back to the base of the nail plate.  Recommend that we perform a total temporary nail avulsion of the left hallux nail plate to allow the nail to grow in.  The patient agrees and removed.  Risk benefits advanced stages were explained.  No guarantees were expressed or implied. -The toe was prepped in aseptic manner and digital block performed using 3 mL 2% lidocaine plain -The nail was avulsed in its entirety followed by dry sterile dressings.  Post care instructions provided -Return to clinic 4 weeks     Edrick Kins, DPM [facility]  Dr. Edrick Kins, DPM    [address]  [place], [place] [address]                Office [phone]  Fax [phone]     

## 2022-12-30 ENCOUNTER — Ambulatory Visit: Payer: Medicare HMO | Admitting: Podiatry

## 2023-01-05 ENCOUNTER — Ambulatory Visit: Payer: Medicare HMO | Admitting: Podiatry

## 2023-01-05 DIAGNOSIS — M722 Plantar fascial fibromatosis: Secondary | ICD-10-CM

## 2023-01-05 DIAGNOSIS — L600 Ingrowing nail: Secondary | ICD-10-CM | POA: Diagnosis not present

## 2023-01-05 MED ORDER — MELOXICAM 15 MG PO TABS: 15.0000 mg | ORAL_TABLET | Freq: Every day | ORAL | 1 refills | Status: DC

## 2023-01-05 NOTE — Progress Notes (Signed)
   Chief Complaint  Patient presents with   Ingrown Toenail    Right foot hallux ingrown toenail, right foot plantar fasciitis injection     Subjective: 67 y.o. male presenting today for follow-up evaluation of plantar fasciitis to the right foot.  He says the injection did help temporarily for a while.  He also states that he has been suffering from an ingrown toenail to the medial border of the right great toe.  He went to [place] [facility] where the simply debrided the nail plate but it did not seem to alleviate his symptoms.  He continues to have pain and tenderness and is concerned for ingrown toenail.   Past Medical History:  Diagnosis Date   COPD (chronic obstructive pulmonary disease) ([place])    Emphysema lung ([place])    Past Surgical History:  Procedure Laterality Date   JOINT REPLACEMENT     Allergies  Allergen Reactions   Dust Mite Extract Cough   Gramineae Pollens Cough     Objective: Physical Exam General: The patient is alert and oriented x3 in no acute distress.  Dermatology: Skin is warm, dry and supple bilateral lower extremities. Negative for open lesions or macerations bilateral.  Dystrophic loosely adhered nail plate noted to the left hallux nail plate  Vascular: Dorsalis Pedis and Posterior Tibial pulses palpable bilateral.  Capillary fill time is immediate to all digits.  Neurological: Grossly intact via light touch  Musculoskeletal: Tenderness to palpation to the plantar aspect of the right heel along the plantar fascia. All other joints range of motion within normal limits bilateral. Strength 5/5 in all groups bilateral.   Radiographic exam RT foot: Normal osseous mineralization. Joint spaces preserved.  Plantar heel spur noted on lateral view no other soft tissue abnormalities or radiopaque foreign bodies.   Assessment/plan of care: 1. Plantar fasciitis right  -Patient evaluated. -Injection of 0.5cc Celestone soluspan injected into the right plantar  fascia  - Continue meloxicam 15 mg daily.  Refill sent to the pharmacy - Continue plantar fascial band(s)  to wear daily - Continue to avoid barefoot.  Recommend good supportive shoes and sneakers  2.  Ingrown toenail medial border right great toe  -Discussed treatment alternatives and plan of care. Explained nail avulsion procedure and post procedure course to patient. -Patient opted for permanent partial nail avulsion of the ingrown portion of the nail.  -Prior to procedure, local anesthesia infiltration utilized using 3 ml of a 50:50 mixture of 2% plain lidocaine and 0.5% plain marcaine in a normal hallux block fashion and a betadine prep performed.  -Partial permanent nail avulsion with chemical matrixectomy performed using [id number] applications of phenol followed by alcohol flush.  -Light dressing applied.  Post care instructions provided -Return to clinic 3 weeks  Edrick Kins, DPM [facility]  Dr. Edrick Kins, DPM    [address], [place] [address]                Office [phone]  Fax [phone]

## 2023-01-19 ENCOUNTER — Other Ambulatory Visit: Payer: Medicare HMO

## 2023-01-30 ENCOUNTER — Ambulatory Visit: Payer: Medicare HMO | Admitting: Podiatry

## 2023-01-30 DIAGNOSIS — L03031 Cellulitis of right toe: Secondary | ICD-10-CM

## 2023-01-30 DIAGNOSIS — M722 Plantar fascial fibromatosis: Secondary | ICD-10-CM

## 2023-01-30 MED ORDER — DOXYCYCLINE HYCLATE 100 MG PO TABS: 100.0000 mg | ORAL_TABLET | Freq: Two times a day (BID) | ORAL | 0 refills | Status: DC

## 2023-01-30 NOTE — Patient Instructions (Signed)
[employer] in [employer]

## 2023-01-30 NOTE — Progress Notes (Signed)
   No chief complaint on file.   Subjective: 67 y.o. male presenting today for follow-up evaluation of plantar fasciitis to the right foot.  He says the injection did help temporarily for a while.  Last visit on 01/05/2023 patient also had partial nail matricectomy due to chronic ingrown toenail.  Presenting for follow-up treatment evaluation   Past Medical History:  Diagnosis Date   COPD (chronic obstructive pulmonary disease) (HCC)    Emphysema lung (HCC)    Past Surgical History:  Procedure Laterality Date   JOINT REPLACEMENT     Allergies  Allergen Reactions   Dust Mite Extract Cough   Gramineae Pollens Cough     Objective: Physical Exam General: The patient is alert and oriented x3 in no acute distress.  Dermatology: Skin is warm, dry and supple bilateral lower extremities.  There is some slight erythema around the nail matricectomy site.  Concerning for possible low-grade localized cellulitis around the area.  No malodor.  No purulence.  There is some slight serous drainage  Vascular: Dorsalis Pedis and Posterior Tibial pulses palpable bilateral.  Capillary fill time is immediate to all digits.  Neurological: Grossly intact via light touch  Musculoskeletal: There continues to be tenderness to palpation to the plantar aspect of the right heel along the plantar fascia. All other joints range of motion within normal limits bilateral. Strength 5/5 in all groups bilateral.   Radiographic exam RT foot 12/02/2022: Normal osseous mineralization. Joint spaces preserved.  Plantar heel spur noted on lateral view no other soft tissue abnormalities or radiopaque foreign bodies.   Assessment/plan of care: 1. Plantar fasciitis right -Patient evaluated. -Injection of 0.5cc Celestone soluspan injected into the right plantar fascia  - Continue meloxicam 15 mg daily as needed.  Refill sent to the pharmacy - Appointment for custom molded orthotics scheduled for 02/13/2023 - Continue to  avoid barefoot.  Recommend good supportive shoes and sneakers.  Recommended [employer]  2.  Ingrown toenail medial border right great toe - Due to the slight erythema around the nail matricectomy site I did call in a prescription for doxycycline 100 mg 2 times daily #20. -Continue soaking with antibiotic ointment and Band-Aid daily -Return to clinic 3 weeks  Felecia Shelling, DPM [facility]  Dr. Felecia Shelling, DPM    [address] [place], [place] [address]                Office [phone]  Fax [phone]

## 2023-02-09 DIAGNOSIS — M722 Plantar fascial fibromatosis: Secondary | ICD-10-CM | POA: Diagnosis not present

## 2023-02-09 MED ORDER — BETAMETHASONE SOD PHOS & ACET 6 (3-3) MG/ML IJ SUSP
3.0000 mg | Freq: Once | INTRAMUSCULAR | Status: AC
  Administered 2023-02-09: 3 mg via INTRA_ARTICULAR

## 2023-02-10 ENCOUNTER — Ambulatory Visit (INDEPENDENT_AMBULATORY_CARE_PROVIDER_SITE_OTHER): Payer: Medicare HMO

## 2023-02-10 DIAGNOSIS — L03031 Cellulitis of right toe: Secondary | ICD-10-CM

## 2023-02-10 DIAGNOSIS — M722 Plantar fascial fibromatosis: Secondary | ICD-10-CM | POA: Diagnosis not present

## 2023-02-10 NOTE — Progress Notes (Signed)
Patient presents today to be casted for custom molded orthotics. Jesus Mckinney is the treating physician.  Impression foam cast was taken. ABN signed.  Patient info-  Shoe size: 94M  Shoe style: ATHLETIC  Height: 5FT 5IN  Weight: 218  Insurance: [employer] MEDICARE   Patient will be notified once orthotics arrive in office and reappoint for fitting at that time.

## 2023-02-17 ENCOUNTER — Other Ambulatory Visit: Payer: Self-pay | Admitting: Podiatry

## 2023-02-17 DIAGNOSIS — M722 Plantar fascial fibromatosis: Secondary | ICD-10-CM

## 2023-02-26 ENCOUNTER — Other Ambulatory Visit: Payer: Self-pay | Admitting: Sports Medicine

## 2023-02-26 DIAGNOSIS — M1612 Unilateral primary osteoarthritis, left hip: Secondary | ICD-10-CM

## 2023-02-26 DIAGNOSIS — M25552 Pain in left hip: Secondary | ICD-10-CM

## 2023-02-26 DIAGNOSIS — M7062 Trochanteric bursitis, left hip: Secondary | ICD-10-CM

## 2023-03-04 ENCOUNTER — Encounter: Payer: Self-pay | Admitting: Internal Medicine

## 2023-03-04 ENCOUNTER — Encounter: Payer: Self-pay | Admitting: Sports Medicine

## 2023-03-07 ENCOUNTER — Ambulatory Visit
Admission: RE | Admit: 2023-03-07 | Discharge: 2023-03-07 | Disposition: A | Discharge status: Home / Self Care | Payer: Medicare HMO | Source: Ambulatory Visit | Attending: Sports Medicine | Admitting: Sports Medicine

## 2023-03-07 DIAGNOSIS — M7062 Trochanteric bursitis, left hip: Secondary | ICD-10-CM

## 2023-03-07 DIAGNOSIS — M1612 Unilateral primary osteoarthritis, left hip: Secondary | ICD-10-CM

## 2023-03-07 DIAGNOSIS — M25552 Pain in left hip: Secondary | ICD-10-CM

## 2023-03-10 NOTE — Anesthesia Preprocedure Evaluation (Signed)
Anesthesia Evaluation  Patient identified by MRN, date of birth, ID band Patient awake    Reviewed: Allergy & Precautions, H&P , NPO status , Patient's Chart, lab work & pertinent test results  Airway Mallampati: III  TM Distance: >3 FB Neck ROM: full    Dental  (+) Edentulous Upper, Edentulous Lower   Pulmonary asthma , sleep apnea , COPD, former smoker severe persistent asthma with recurrent exacerbations requiring hospitalization recently treated with oral steroids.   Pulmonary exam normal        Cardiovascular + CAD  + dysrhythmias (past episodes of afib. No reoccurance)  Rhythm:Regular Rate:Normal - Peripheral Edema    Neuro/Psych  PSYCHIATRIC DISORDERS  Depression    negative neurological ROS     GI/Hepatic Neg liver ROS,GERD  ,,  Endo/Other  diabetes (pre-diabetic)    Renal/GU negative Renal ROS  negative genitourinary   Musculoskeletal   Abdominal  (+) + obese  Peds  Hematology negative hematology ROS (+)   Anesthesia Other Findings Past Medical History: No date: Anemia No date: Asthma No date: Atrial fibrillation (HCC) No date: COPD (chronic obstructive pulmonary disease) (HCC) No date: Coronary artery disease No date: Diabetes mellitus without complication (HCC) No date: Emphysema lung (HCC) No date: GERD (gastroesophageal reflux disease) 2022: Hyperlipidemia No date: Sleep apnea  Past Surgical History: No date: CARDIAC CATHETERIZATION No date: COLONOSCOPY No date: ESOPHAGOGASTRODUODENOSCOPY No date: HERNIA REPAIR No date: JOINT REPLACEMENT; Right     Comment:  knee No date: KNEE ARTHROSCOPY     Reproductive/Obstetrics negative OB ROS                             Anesthesia Physical Anesthesia Plan  ASA: 3  Anesthesia Plan: General   Post-op Pain Management:    Induction: Intravenous  PONV Risk Score and Plan: Propofol infusion and TIVA  Airway Management  Planned: Natural Airway  Additional Equipment:   Intra-op Plan:   Post-operative Plan:   Informed Consent: I have reviewed the patients History and Physical, chart, labs and discussed the procedure including the risks, benefits and alternatives for the proposed anesthesia with the patient or authorized representative who has indicated his/her understanding and acceptance.     Dental Advisory Given  Plan Discussed with: CRNA and Surgeon  Anesthesia Plan Comments:         Anesthesia Quick Evaluation

## 2023-03-11 ENCOUNTER — Encounter
Admission: RE | Disposition: A | Discharge status: Home / Self Care | Payer: Self-pay | Source: Home / Self Care | Attending: Internal Medicine

## 2023-03-11 ENCOUNTER — Ambulatory Visit
Admission: RE | Admit: 2023-03-11 | Discharge: 2023-03-11 | Disposition: A | Discharge status: Home / Self Care | Payer: Medicare HMO | Attending: Internal Medicine | Admitting: Internal Medicine

## 2023-03-11 ENCOUNTER — Ambulatory Visit: Payer: Medicare HMO | Admitting: Anesthesiology

## 2023-03-11 DIAGNOSIS — K222 Esophageal obstruction: Secondary | ICD-10-CM | POA: Diagnosis not present

## 2023-03-11 DIAGNOSIS — Z1211 Encounter for screening for malignant neoplasm of colon: Secondary | ICD-10-CM | POA: Diagnosis not present

## 2023-03-11 DIAGNOSIS — F32A Depression, unspecified: Secondary | ICD-10-CM | POA: Insufficient documentation

## 2023-03-11 DIAGNOSIS — Z79899 Other long term (current) drug therapy: Secondary | ICD-10-CM | POA: Insufficient documentation

## 2023-03-11 DIAGNOSIS — J439 Emphysema, unspecified: Secondary | ICD-10-CM | POA: Insufficient documentation

## 2023-03-11 DIAGNOSIS — K229 Disease of esophagus, unspecified: Secondary | ICD-10-CM | POA: Insufficient documentation

## 2023-03-11 DIAGNOSIS — Z7951 Long term (current) use of inhaled steroids: Secondary | ICD-10-CM | POA: Diagnosis not present

## 2023-03-11 DIAGNOSIS — K640 First degree hemorrhoids: Secondary | ICD-10-CM | POA: Diagnosis not present

## 2023-03-11 DIAGNOSIS — K5730 Diverticulosis of large intestine without perforation or abscess without bleeding: Secondary | ICD-10-CM | POA: Insufficient documentation

## 2023-03-11 DIAGNOSIS — G4730 Sleep apnea, unspecified: Secondary | ICD-10-CM | POA: Diagnosis not present

## 2023-03-11 DIAGNOSIS — K2970 Gastritis, unspecified, without bleeding: Secondary | ICD-10-CM | POA: Diagnosis not present

## 2023-03-11 DIAGNOSIS — Z86010 Personal history of colonic polyps: Secondary | ICD-10-CM | POA: Diagnosis not present

## 2023-03-11 DIAGNOSIS — K219 Gastro-esophageal reflux disease without esophagitis: Secondary | ICD-10-CM | POA: Diagnosis not present

## 2023-03-11 DIAGNOSIS — E119 Type 2 diabetes mellitus without complications: Secondary | ICD-10-CM | POA: Diagnosis not present

## 2023-03-11 DIAGNOSIS — Z87891 Personal history of nicotine dependence: Secondary | ICD-10-CM | POA: Diagnosis not present

## 2023-03-11 DIAGNOSIS — K449 Diaphragmatic hernia without obstruction or gangrene: Secondary | ICD-10-CM | POA: Diagnosis not present

## 2023-03-11 DIAGNOSIS — I2510 Atherosclerotic heart disease of native coronary artery without angina pectoris: Secondary | ICD-10-CM | POA: Diagnosis not present

## 2023-03-11 DIAGNOSIS — R1310 Dysphagia, unspecified: Secondary | ICD-10-CM | POA: Diagnosis not present

## 2023-03-11 DIAGNOSIS — Z7982 Long term (current) use of aspirin: Secondary | ICD-10-CM | POA: Insufficient documentation

## 2023-03-11 DIAGNOSIS — I4891 Unspecified atrial fibrillation: Secondary | ICD-10-CM | POA: Insufficient documentation

## 2023-03-11 DIAGNOSIS — E785 Hyperlipidemia, unspecified: Secondary | ICD-10-CM | POA: Diagnosis not present

## 2023-03-11 HISTORY — DX: Unspecified atrial fibrillation: I48.91

## 2023-03-11 HISTORY — PX: ESOPHAGOGASTRODUODENOSCOPY: SHX5428

## 2023-03-11 HISTORY — DX: Gastro-esophageal reflux disease without esophagitis: K21.9

## 2023-03-11 HISTORY — DX: Atherosclerotic heart disease of native coronary artery without angina pectoris: I25.10

## 2023-03-11 HISTORY — DX: Sleep apnea, unspecified: G47.30

## 2023-03-11 HISTORY — PX: COLONOSCOPY: SHX5424

## 2023-03-11 HISTORY — DX: Type 2 diabetes mellitus without complications: E11.9

## 2023-03-11 HISTORY — DX: Unspecified asthma, uncomplicated: J45.909

## 2023-03-11 HISTORY — DX: Anemia, unspecified: D64.9

## 2023-03-11 LAB — GLUCOSE, CAPILLARY: Glucose-Capillary: 82 mg/dL (ref 70–99)

## 2023-03-11 LAB — KOH PREP: KOH Prep: NONE SEEN

## 2023-03-11 SURGERY — EGD (ESOPHAGOGASTRODUODENOSCOPY)
Anesthesia: General

## 2023-03-11 MED ORDER — PROPOFOL 10 MG/ML IV BOLUS
INTRAVENOUS | Status: AC
  Filled 2023-03-11: qty 20

## 2023-03-11 MED ORDER — DEXMEDETOMIDINE HCL IN NACL 80 MCG/20ML IV SOLN
INTRAVENOUS | Status: DC | PRN
  Administered 2023-03-11: 12 ug via INTRAVENOUS
  Administered 2023-03-11: 8 ug via INTRAVENOUS

## 2023-03-11 MED ORDER — SODIUM CHLORIDE 0.9 % IV SOLN
INTRAVENOUS | Status: DC
  Administered 2023-03-11: 20 mL/h via INTRAVENOUS

## 2023-03-11 MED ORDER — PROPOFOL 500 MG/50ML IV EMUL
INTRAVENOUS | Status: DC | PRN
  Administered 2023-03-11: 50 ug/kg/min via INTRAVENOUS

## 2023-03-11 MED ORDER — PROPOFOL 10 MG/ML IV BOLUS
INTRAVENOUS | Status: DC | PRN
  Administered 2023-03-11: 30 mg via INTRAVENOUS
  Administered 2023-03-11 (×2): 50 mg via INTRAVENOUS
  Administered 2023-03-11: 100 mg via INTRAVENOUS
  Administered 2023-03-11: 30 mg via INTRAVENOUS
  Administered 2023-03-11 (×2): 20 mg via INTRAVENOUS

## 2023-03-11 MED ORDER — LIDOCAINE HCL (PF) 2 % IJ SOLN
INTRAMUSCULAR | Status: AC
  Filled 2023-03-11: qty 5

## 2023-03-11 MED ORDER — LIDOCAINE HCL (CARDIAC) PF 100 MG/5ML IV SOSY
PREFILLED_SYRINGE | INTRAVENOUS | Status: DC | PRN
  Administered 2023-03-11: 40 mg via INTRAVENOUS

## 2023-03-11 NOTE — Anesthesia Postprocedure Evaluation (Signed)
Anesthesia Post Note  Patient: Jesus Mckinney  Procedure(s) Performed: ESOPHAGOGASTRODUODENOSCOPY (EGD) COLONOSCOPY  Patient location during evaluation: Endoscopy Anesthesia Type: General Level of consciousness: awake and alert Pain management: pain level controlled Vital Signs Assessment: post-procedure vital signs reviewed and stable Respiratory status: spontaneous breathing, nonlabored ventilation and respiratory function stable Cardiovascular status: blood pressure returned to baseline and stable Postop Assessment: no apparent nausea or vomiting Anesthetic complications: no   No notable events documented.   Last Vitals:  Vitals:   03/11/23 1021 03/11/23 1026  BP: (!) 89/46 110/62  Pulse:    Resp:    Temp:    SpO2:      Last Pain:  Vitals:   03/11/23 1036  TempSrc:   PainSc: 0-No pain                 Foye Deer

## 2023-03-11 NOTE — Interval H&P Note (Signed)
History and Physical Interval Note:  03/11/2023 9:22 AM  [name]  has presented today for surgery, with the diagnosis of Personal history of colonic polyps (Z86.010) Pharyngoesophageal dysphagia (R13.14) Gastroesophageal reflux disease, unspecified whether esophagitis present (K21.9) Esophageal dysmotility (K22.4).  The various methods of treatment have been discussed with the patient and family. After consideration of risks, benefits and other options for treatment, the patient has consented to  Procedure(s): ESOPHAGOGASTRODUODENOSCOPY (EGD) (N/A) COLONOSCOPY (N/A) as a surgical intervention.  The patient's history has been reviewed, patient examined, no change in status, stable for surgery.  I have reviewed the patient's chart and labs.  Questions were answered to the patient's satisfaction.     [place], [place]

## 2023-03-11 NOTE — Op Note (Signed)
[facility] Gastroenterology Patient Name: Jesus Mckinney Procedure Date: 03/11/2023 9:30 AM MRN: [id number] Account #: [id number] Date of Birth: Jul 16, 1956 Admit Type: Outpatient Age: 67 Room: [facility] ENDO ROOM 2 Gender: Male Note Status: Finalized Instrument Name: Upper Endoscope [id number] Procedure:             Upper GI endoscopy Indications:           Dysphagia, Suspected gastro-esophageal reflux disease Providers:             Boykin Nearing. Norma Fredrickson MD, MD Referring MD:          Clydie Braun (Referring MD) Medicines:             Propofol per Anesthesia Complications:         No immediate complications. Procedure:             Pre-Anesthesia Assessment:                        - The risks and benefits of the procedure and the                         sedation options and risks were discussed with the                         patient. All questions were answered and informed                         consent was obtained.                        - Patient identification and proposed procedure were                         verified prior to the procedure by the nurse. The                         procedure was verified in the procedure room.                        - ASA Grade Assessment: III - A patient with severe                         systemic disease.                        - After reviewing the risks and benefits, the patient                         was deemed in satisfactory condition to undergo the                         procedure.                        After obtaining informed consent, the endoscope was                         passed under direct vision. Throughout the procedure,  the patient's blood pressure, pulse, and oxygen                         saturations were monitored continuously. The Endoscope                         was introduced through the mouth, and advanced to the                         third part of duodenum. The upper GI  endoscopy was                         accomplished without difficulty. The patient tolerated                         the procedure well. Findings:      Patchy, white plaques were found in the mid esophagus. Cells for       cytology were obtained by brushing.      One benign-appearing, intrinsic mild stenosis was found at the       gastroesophageal junction. This stenosis measured 1.5 cm (inner       diameter) x less than one cm (in length). The stenosis was traversed.       The scope was withdrawn. Dilation was performed with a Maloney dilator       with mild resistance at 54 Fr.      Patchy minimal inflammation characterized by erythema was found in the       gastric antrum.      A 1 cm hiatal hernia was present.      The exam of the stomach was otherwise normal.      The examined duodenum was normal.      The exam was otherwise without abnormality. Impression:            - Esophageal plaques were found, suspicious for                         candidiasis. Cells for cytology obtained.                        - Benign-appearing esophageal stenosis. Dilated.                        - Gastritis.                        - 1 cm hiatal hernia.                        - Normal examined duodenum.                        - The examination was otherwise normal. Recommendation:        - Await pathology results.                        - Monitor results to esophageal dilation                        - Proceed with colonoscopy Procedure Code(s):     --- Professional ---  16109, Esophagogastroduodenoscopy, flexible,                         transoral; diagnostic, including collection of                         specimen(s) by brushing or washing, when performed                         (separate procedure)                        43450, Dilation of esophagus, by unguided sound or                         bougie, single or multiple passes Diagnosis Code(s):     --- Professional ---                         R13.10, Dysphagia, unspecified                        K44.9, Diaphragmatic hernia without obstruction or                         gangrene                        K29.70, Gastritis, unspecified, without bleeding                        K22.2, Esophageal obstruction                        K22.9, Disease of esophagus, unspecified CPT copyright 2022 American Medical Association. All rights reserved. The codes documented in this report are preliminary and upon coder review may  be revised to meet current compliance requirements. Stanton Kidney MD, MD 03/11/2023 9:44:26 AM This report has been signed electronically. Number of Addenda: 0 Note Initiated On: 03/11/2023 9:30 AM Estimated Blood Loss:  Estimated blood loss: none.      [facility]

## 2023-03-11 NOTE — Anesthesia Procedure Notes (Signed)
Procedure Name: General with mask airway Date/Time: 03/11/2023 9:31 AM  Performed by: Lily Lovings, CRNAPre-anesthesia Checklist: Patient identified, Emergency Drugs available, Suction available, Patient being monitored and Timeout performed Patient Re-evaluated:Patient Re-evaluated prior to induction Oxygen Delivery Method: Simple face mask Preoxygenation: Pre-oxygenation with 100% oxygen Induction Type: IV induction

## 2023-03-11 NOTE — Transfer of Care (Signed)
Immediate Anesthesia Transfer of Care Note  Patient: Jesus Mckinney  Procedure(s) Performed: ESOPHAGOGASTRODUODENOSCOPY (EGD) COLONOSCOPY  Patient Location: Endoscopy Unit  Anesthesia Type:General  Level of Consciousness: drowsy and patient cooperative  Airway & Oxygen Therapy: Patient Spontanous Breathing and Patient connected to face mask oxygen  Post-op Assessment: Report given to RN and Patient moving all extremities X 4  Post vital signs: Reviewed and stable  Last Vitals:  Vitals Value Taken Time  BP 101/58 03/11/23 1009  Temp 36.5 C 03/11/23 1006  Pulse 54 03/11/23 1010  Resp 19 03/11/23 1010  SpO2 95 % 03/11/23 1010  Vitals shown include unvalidated device data.  Last Pain:  Vitals:   03/11/23 1006  TempSrc: Temporal  PainSc: Asleep         Complications: No notable events documented.

## 2023-03-11 NOTE — H&P (Signed)
Outpatient short stay form Pre-procedure 03/11/2023 9:16 AM Jesus Mckinney, M.D.  Primary Physician: Jesus Mckinney ,M.D.  Reason for visit:  Dysphagia, personal history of colon polyps   History of present illness:  Jesus Mckinney presents to the [facility] at the request of his PCP for chief complaint of high-risk colon cancer surveillance 2/2 personal history of colon polyps, GERD, and dysphagia. He moved from [place], [place] to [place] ~2 years ago. He reports a history of multiple colon polyps and gastric polyps. He had colonoscopy performed in [place] ~1 year ago and was told to have a 1-year repeat due to poor bowel prep. He endorses a history of chronic constipation where he has a formed, small-volume stool every 2-3 days. He is not currently on any bowel regimen. He has been hesitant to start OTC laxatives because he didn't know if they were safe to take long-term. He denies any issues with hematochezia, melena, fecal urgency, or fecal incontinence. He denies any complaints of abdominal pain or abdominal cramping. Appetite and diet are stable without any unintentional weight loss. He does endorse a longstanding history of GERD and dysphagia symptoms. 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 He reports he can have issues swallowing both solids, liquids, and pills. He notices more issues with solid foods. He feels like sometimes when he swallows solid things it feels like they are getting hung up at level of his upper sternum. He sometimes has to spit it up to get it to go down again. He broke his bottom denture several months ago by dropping it on a concrete floor and has noticed more issues chewing up his food. GERD symptoms are reasonably well-controlled with use of Protonix 20 mg twice daily before meals. He denies any nocturnal symptoms. He can have  occasional breakthrough heartburn symptoms in the afternoons. He has history of severe persistent asthma with recurrent exacerbations requiring hospitalizations. He was last hospitalized for COPD exacerbation 01/2022 requiring systemic steroids.     Current Facility-Administered Medications:    0.9 %  sodium chloride infusion, , Intravenous, Continuous, Jesus Mckinney, Jesus Nearing, MD, Last Rate: 20 mL/hr at 03/11/23 0901, 20 mL/hr at 03/11/23 0901  Medications Prior to Admission  Medication Sig Dispense Refill Last Dose   albuterol (VENTOLIN HFA) 108 (90 Base) MCG/ACT inhaler Inhale 1-2 puffs into the lungs every 6 (six) hours as needed.   03/10/2023 at 0700   aspirin 81 MG EC tablet Take 81 mg by mouth daily.   Past Week   atenolol (TENORMIN) 25 MG tablet Take 25 mg by mouth daily.   03/10/2023   atorvastatin (LIPITOR) 40 MG tablet Take 40 mg by mouth daily.   03/10/2023   escitalopram (LEXAPRO) 20 MG tablet Take 13 mg by mouth daily.   03/10/2023   ezetimibe (ZETIA) 10 MG tablet Take 10 mg by mouth daily.   03/10/2023   ibuprofen (ADVIL) 200 MG tablet Take 2 tablets (400 mg total) by mouth every 8 (eight) hours as needed for mild pain or moderate pain.   Past Week   lamoTRIgine (LAMICTAL) 200 MG tablet Take 200 mg by mouth daily.   03/10/2023   meloxicam (MOBIC) 15 MG tablet Take 1 tablet (15 mg total) by mouth daily. 30 tablet 1 03/10/2023   methylPREDNISolone (MEDROL DOSEPAK) 4 MG TBPK tablet 6 day dose pack - take as directed 21  tablet 0 03/10/2023   albuterol (ACCUNEB) 1.25 MG/3ML nebulizer solution Take 3 mLs (1.25 mg total) by nebulization every 4 (four) hours as needed for wheezing or shortness of breath. 75 mL 1    azithromycin (ZITHROMAX) 250 MG tablet Take 1 tablet daily for the next 3 days 3 each 0    donepezil (ARICEPT) 5 MG tablet Take 5 mg by mouth daily.      doxycycline (VIBRA-TABS) 100 MG tablet Take 1 tablet (100 mg total) by mouth 2 (two) times daily. 20 tablet 0    fluticasone-salmeterol  (ADVAIR HFA) 230-21 MCG/ACT inhaler Inhale 2 puffs into the lungs 2 (two) times daily.      HYDROcodone-acetaminophen (NORCO) 5-325 MG tablet Take 1 tablet by mouth every 6 (six) hours as needed for moderate pain. 30 tablet 0    ipratropium-albuterol (DUONEB) 0.5-2.5 (3) MG/3ML SOLN Take 3 mLs by nebulization every 6 (six) hours as needed. 360 mL 1    lidocaine (LIDODERM) 5 % Place 1 patch onto the skin daily. Remove & Discard patch within 12 hours or as directed by MD (Patient not taking: Reported on 12/02/2022) 30 patch 0    OLANZapine (ZYPREXA) 2.5 MG tablet Take 1.25 mg by mouth daily.      omeprazole (PRILOSEC) 40 MG capsule Take 40 mg by mouth daily.        Allergies  Allergen Reactions   Dust Mite Extract Cough   Gramineae Pollens Cough     Past Medical History:  Diagnosis Date   Anemia    Asthma    Atrial fibrillation (HCC)    COPD (chronic obstructive pulmonary disease) (HCC)    Coronary artery disease    Diabetes mellitus without complication (HCC)    Emphysema lung (HCC)    GERD (gastroesophageal reflux disease)    Hyperlipidemia 2022   Sleep apnea     Review of systems:  Otherwise negative.    Physical Exam  Gen: Alert, oriented. Appears stated age.  HEENT: [place]/AT. PERRLA. Lungs: CTA, no wheezes. CV: RR nl S1, S2. Abd: soft, benign, no masses. BS+ Ext: No edema. Pulses 2+    Planned procedures: Proceed with EGD and colonoscopy. The patient understands the nature of the planned procedure, indications, risks, alternatives and potential complications including but not limited to bleeding, infection, perforation, damage to internal organs and possible oversedation/side effects from anesthesia. The patient agrees and gives consent to proceed.  Please refer to procedure notes for findings, recommendations and patient disposition/instructions.     Jesus Mckinney K. Jesus Mckinney, M.D. Gastroenterology 03/11/2023  9:16 AM

## 2023-03-11 NOTE — Op Note (Signed)
[facility] Gastroenterology Patient Name: Jesus Mckinney Procedure Date: 03/11/2023 9:28 AM MRN: [id number] Account #: [id number] Date of Birth: 04/09/1956 Admit Type: Outpatient Age: 67 Room: [facility] ENDO ROOM 2 Gender: Male Note Status: Finalized Instrument Name: Prentice Docker [id number] Procedure:             Colonoscopy Indications:           High risk colon cancer surveillance: Personal history                         of colonic polyps, Surveillance: Personal history of                         adenomatous polyps, inadequate prep on last                         colonoscopy (less than 1 year ago) Providers:             Royce Macadamia K. Norma Fredrickson MD, MD Referring MD:          Clydie Braun (Referring MD) Medicines:             Propofol per Anesthesia Complications:         No immediate complications. Procedure:             Pre-Anesthesia Assessment:                        - The risks and benefits of the procedure and the                         sedation options and risks were discussed with the                         patient. All questions were answered and informed                         consent was obtained.                        - Patient identification and proposed procedure were                         verified prior to the procedure by the nurse. The                         procedure was verified in the procedure room.                        - ASA Grade Assessment: III - A patient with severe                         systemic disease.                        - After reviewing the risks and benefits, the patient                         was deemed in satisfactory condition to undergo the  procedure.                        After obtaining informed consent, the colonoscope was                         passed under direct vision. Throughout the procedure,                         the patient's blood pressure, pulse, and oxygen                          saturations were monitored continuously. The                         Colonoscope was introduced through the anus and                         advanced to the the cecum, identified by appendiceal                         orifice and ileocecal valve. The colonoscopy was                         technically difficult and complex due to inadequate                         bowel prep. The patient tolerated the procedure well.                         The quality of the bowel preparation was fair. The                         ileocecal valve, appendiceal orifice, and rectum were                         photographed. Findings:      The perianal and digital rectal examinations were normal. Pertinent       negatives include normal sphincter tone and no palpable rectal lesions.      Non-bleeding internal hemorrhoids were found during retroflexion. The       hemorrhoids were Grade I (internal hemorrhoids that do not prolapse).      Many small-mouthed diverticula were found in the sigmoid colon.      A large amount of semi-liquid stool was found in the sigmoid colon, in       the descending colon, in the transverse colon and in the cecum,       interfering with visualization. Lavage of the area was performed using a       moderate amount of sterile water, resulting in clearance with fair       visualization.      The exam was otherwise without abnormality. Impression:            - Preparation of the colon was fair.                        - Non-bleeding internal hemorrhoids.                        - Diverticulosis  in the sigmoid colon.                        - Stool in the sigmoid colon, in the descending colon,                         in the transverse colon and in the cecum.                        - The examination was otherwise normal.                        - No specimens collected. Recommendation:        - Await pathology results from EGD, also performed                         today.                         - Monitor results to esophageal dilation                        - Patient has a contact number available for                         emergencies. The signs and symptoms of potential                         delayed complications were discussed with the patient.                         Return to normal activities tomorrow. Written                         discharge instructions were provided to the patient.                        - Resume previous diet.                        - Continue present medications.                        - Repeat colonoscopy in 3 years for surveillance.                        - Return to physician assistant in 3 months.                        - Telephone GI office to schedule appointment.                        - The findings and recommendations were discussed with                         the patient. Procedure Code(s):     --- Professional ---                        X3244, Colorectal cancer screening; colonoscopy on  individual at high risk Diagnosis Code(s):     --- Professional ---                        K57.30, Diverticulosis of large intestine without                         perforation or abscess without bleeding                        Z86.010, Personal history of colonic polyps                        K64.0, First degree hemorrhoids CPT copyright 2022 American Medical Association. All rights reserved. The codes documented in this report are preliminary and upon coder review may  be revised to meet current compliance requirements. Stanton Kidney MD, MD 03/11/2023 10:10:30 AM This report has been signed electronically. Number of Addenda: 0 Note Initiated On: 03/11/2023 9:28 AM Scope Withdrawal Time: 0 hours 5 minutes 6 seconds  Total Procedure Duration: 0 hours 10 minutes 59 seconds  Estimated Blood Loss:  Estimated blood loss: none.      [facility]

## 2023-03-12 ENCOUNTER — Encounter: Payer: Self-pay | Admitting: Internal Medicine

## 2023-03-20 ENCOUNTER — Other Ambulatory Visit: Payer: Medicare HMO

## 2023-03-30 ENCOUNTER — Other Ambulatory Visit: Payer: Self-pay | Admitting: Orthopedic Surgery

## 2023-04-01 ENCOUNTER — Encounter: Payer: Self-pay | Admitting: Orthopedic Surgery

## 2023-04-01 ENCOUNTER — Encounter
Admission: RE | Admit: 2023-04-01 | Discharge: 2023-04-01 | Disposition: A | Discharge status: Home / Self Care | Payer: Medicare HMO | Source: Ambulatory Visit | Attending: Orthopedic Surgery | Admitting: Orthopedic Surgery

## 2023-04-01 ENCOUNTER — Encounter
Admission: RE | Admit: 2023-04-01 | Discharge: 2023-04-01 | Disposition: A | Discharge status: Home / Self Care | Payer: Medicare HMO | Source: Ambulatory Visit | Attending: Psychiatry | Admitting: Psychiatry

## 2023-04-01 ENCOUNTER — Other Ambulatory Visit: Payer: Self-pay

## 2023-04-01 VITALS — Ht 65.0 in | Wt 216.0 lb

## 2023-04-01 DIAGNOSIS — R7303 Prediabetes: Secondary | ICD-10-CM | POA: Diagnosis not present

## 2023-04-01 DIAGNOSIS — I10 Essential (primary) hypertension: Secondary | ICD-10-CM | POA: Insufficient documentation

## 2023-04-01 DIAGNOSIS — Z01818 Encounter for other preprocedural examination: Secondary | ICD-10-CM | POA: Diagnosis present

## 2023-04-01 DIAGNOSIS — E669 Obesity, unspecified: Secondary | ICD-10-CM

## 2023-04-01 HISTORY — DX: Essential (primary) hypertension: I10

## 2023-04-01 LAB — CBC
HCT: 40.4 % (ref 39.0–52.0)
Hemoglobin: 13.8 g/dL (ref 13.0–17.0)
MCH: 33.4 pg (ref 26.0–34.0)
MCHC: 34.2 g/dL (ref 30.0–36.0)
MCV: 97.8 fL (ref 80.0–100.0)
Platelets: 237 10*3/uL (ref 150–400)
RBC: 4.13 MIL/uL — ABNORMAL LOW (ref 4.22–5.81)
RDW: 13.2 % (ref 11.5–15.5)
WBC: 7.4 10*3/uL (ref 4.0–10.5)
nRBC: 0 % (ref 0.0–0.2)

## 2023-04-01 LAB — BASIC METABOLIC PANEL
Anion gap: 9 (ref 5–15)
BUN: 16 mg/dL (ref 8–23)
CO2: 20 mmol/L — ABNORMAL LOW (ref 22–32)
Calcium: 8.2 mg/dL — ABNORMAL LOW (ref 8.9–10.3)
Chloride: 104 mmol/L (ref 98–111)
Creatinine, Ser: 0.99 mg/dL (ref 0.61–1.24)
GFR, Estimated: >60 mL/min (ref >60)
Glucose, Bld: 93 mg/dL (ref 70–99)
Potassium: 3.8 mmol/L (ref 3.5–5.1)
Sodium: 133 mmol/L — ABNORMAL LOW (ref 135–145)

## 2023-04-01 NOTE — Patient Instructions (Addendum)
Your procedure is scheduled on: Friday April 03, 2023. Report to the Registration Desk on the 1st floor of the [facility]. To find out your arrival time, please call [phone] between 1PM - 3PM on: Thursday April 02, 2023. If your arrival time is 6:00 am, do not arrive before that time as the [facility] entrance doors do not open until 6:00 am.  REMEMBER: Instructions that are not followed completely may result in serious medical risk, up to and including death; or upon the discretion of your surgeon and anesthesiologist your surgery may need to be rescheduled.  Do not eat food after midnight the night before surgery.  No gum chewing or hard candies.  You may however, drink CLEAR liquids up to 2 hours before you are scheduled to arrive for your surgery. Do not drink anything within 2 hours of your scheduled arrival time.  Clear liquids include: - water  - apple juice without pulp - gatorade (not RED colors) - black coffee or tea (Do NOT add milk or creamers to the coffee or tea) Do NOT drink anything that is not on this list.   In addition, your doctor has ordered for you to drink the provided:  Ensure Pre-Surgery Clear Carbohydrate Drink  Drinking this carbohydrate drink up to two hours before surgery helps to reduce insulin resistance and improve patient outcomes. Please complete drinking 2 hours before scheduled arrival time.  One week prior to surgery: Stop Anti-inflammatories (NSAIDS) such as Advil, Aleve, Ibuprofen, Motrin, Naproxen, meloxicam (MOBIC),Naprosyn and Aspirin based products such as Excedrin, Goody's Powder, BC Powder. Stop ANY OVER THE COUNTER supplements until after surgery. You may however, continue to take Tylenol if needed for pain up until the day of surgery.  Continue taking all prescribed medications with the exception of the following:  Follow recommendations from Cardiologist or PCP regarding stopping blood thinners.  TAKE ONLY THESE MEDICATIONS THE  MORNING OF SURGERY WITH A SIP OF WATER:  atenolol (TENORMIN) 25 MG  atorvastatin (LIPITOR) 40 MG  lamoTRIgine (LAMICTAL) 200 MG  pantoprazole (PROTONIX) 20 MG Antacid (take one the night before and one on the morning of surgery - helps to prevent nausea after surgery.)  Use inhalers on the day of surgery and bring to the hospital. Budeson-Glycopyrrol-Formoterol (BREZTRI AEROSPHERE) 160-9-4.8 MCG/ACT AERO   No Alcohol for 24 hours before or after surgery.  No Smoking including e-cigarettes for 24 hours before surgery.  No chewable tobacco products for at least 6 hours before surgery.  No nicotine patches on the day of surgery.  Do not use any "recreational" drugs for at least a week (preferably 2 weeks) before your surgery.  Please be advised that the combination of cocaine and anesthesia may have negative outcomes, up to and including death. If you test positive for cocaine, your surgery will be cancelled.  On the morning of surgery brush your teeth with toothpaste and water, you may rinse your mouth with mouthwash if you wish. Do not swallow any toothpaste or mouthwash.  Use CHG Soap or wipes as directed on instruction sheet.  Do not wear jewelry, make-up, hairpins, clips or nail polish.  Do not wear lotions, powders, or perfumes.   Do not shave body hair from the neck down 48 hours before surgery.  Contact lenses, hearing aids and dentures may not be worn into surgery.  Do not bring valuables to the hospital. [facility] is not responsible for any missing/lost belongings or valuables.   Total Shoulder Arthroplasty:  use  Benzoyl Peroxide 5% Gel as directed on instruction sheet.  Bring your C-PAP to the hospital in case you may have to spend the night.   Notify your doctor if there is any change in your medical condition (cold, fever, infection).  Wear comfortable clothing (specific to your surgery type) to the hospital.  After surgery, you can help prevent lung  complications by doing breathing exercises.  Take deep breaths and cough every 1-2 hours. Your doctor may order a device called an Incentive Spirometer to help you take deep breaths. When coughing or sneezing, hold a pillow firmly against your incision with both hands. This is called "splinting." Doing this helps protect your incision. It also decreases belly discomfort.  If you are being admitted to the hospital overnight, leave your suitcase in the car. After surgery it may be brought to your room.  In case of increased patient census, it may be necessary for you, the patient, to continue your postoperative care in the Same Day Surgery department.  If you are being discharged the day of surgery, you will not be allowed to drive home. You will need a responsible individual to drive you home and stay with you for 24 hours after surgery.   If you are taking public transportation, you will need to have a responsible individual with you.  Please call the Pre-admissions Testing Dept. at [phone] if you have any questions about these instructions.  Surgery Visitation Policy:  Patients having surgery or a procedure may have two visitors.  Children under the age of 58 must have an adult with them who is not the patient.  Inpatient Visitation:    Visiting hours are 7 a.m. to 8 p.m. Up to four visitors are allowed at one time in a patient room. The visitors may rotate out with other people during the day.  One visitor age 66 or older may stay with the patient overnight and must be in the room by 8 p.m.     Preparing for Surgery with CHLORHEXIDINE GLUCONATE (CHG) Soap  Chlorhexidine Gluconate (CHG) Soap  o An antiseptic cleaner that kills germs and bonds with the skin to continue killing germs even after washing  o Used for showering the night before surgery and morning of surgery  Before surgery, you can play an important role by reducing the number of germs on your skin.  CHG  (Chlorhexidine gluconate) soap is an antiseptic cleanser which kills germs and bonds with the skin to continue killing germs even after washing.  Please do not use if you have an allergy to CHG or antibacterial soaps. If your skin becomes reddened/irritated stop using the CHG.  1. Shower the NIGHT BEFORE SURGERY and the MORNING OF SURGERY with CHG soap.  2. If you choose to wash your hair, wash your hair first as usual with your normal shampoo.  3. After shampooing, rinse your hair and body thoroughly to remove the shampoo.  4. Use CHG as you would any other liquid soap. You can apply CHG directly to the skin and wash gently with a scrungie or a clean washcloth.  5. Apply the CHG soap to your body only from the neck down. Do not use on open wounds or open sores. Avoid contact with your eyes, ears, mouth, and genitals (private parts). Wash face and genitals (private parts) with your normal soap.  6. Wash thoroughly, paying special attention to the area where your surgery will be performed.  7. Thoroughly rinse your body with  warm water.  8. Do not shower/wash with your normal soap after using and rinsing off the CHG soap.  9. Pat yourself dry with a clean towel.  10. Wear clean pajamas to bed the night before surgery.  12. Place clean sheets on your bed the night of your first shower and do not sleep with pets.  13. Shower again with the CHG soap on the day of surgery prior to arriving at the hospital.  14. Do not apply any deodorants/lotions/powders.  15. Please wear clean clothes to the hospital.

## 2023-04-01 NOTE — Progress Notes (Signed)
Perioperative / Anesthesia Services  Pre-Admission Testing Clinical Review / Preoperative Anesthesia Consult  Date: 04/02/23  Patient Demographics:  Name: Jesus Mckinney DOB:   Apr 06, 1956 MRN:   [id number]  Planned Surgical Procedure(s):    Case: [id number] Date/Time: 04/03/23 1148   Procedures:      Left hip endoscopic trochanteric bursectomy, IT band release (Left: Hip)   Anesthesia type: Choice   Pre-op diagnosis:      Enthesopathy of hip region on both sides M76.891, M76.892     Trochanteric bursitis of left hip M70.62   Location: [facility] OR ROOM 03 / [facility]   Surgeons: Signa Kell, MD   NOTE: Available PAT nursing documentation and vital signs have been reviewed. Clinical nursing staff has updated patient's PMH/PSHx, current medication list, and drug allergies/intolerances to ensure comprehensive history available to assist in medical decision making as it pertains to the aforementioned surgical procedure and anticipated anesthetic course. Extensive review of available clinical information personally performed. [place] [facility] and PSHx updated with any diagnoses/procedures that  may have been inadvertently omitted during his intake with the pre-admission testing department's nursing staff.  Clinical Discussion:  Jesus Mckinney is a 67 y.o. male who is submitted for pre-surgical anesthesia review and clearance prior to him undergoing the above procedure. Patient is a Former Games developer. Pertinent PMH includes: CAD, PAF, diastolic dysfunction, PSVT, cardiomegaly, aortic atherosclerosis, HTN, HLD, T2DM, asthma, COPD, OSAH (requires nocturnal PAP therapy), GERD (on daily PPI), hiatal hernia, anemia, OA, enthesopathy of BILATERAL hips, LEFT hip trochanteric bursitis, osteoporosis, Sjogren's disease, depression, anxiety, mild cognitive impairment.  Patient is followed by cardiology [name], MD). He was last seen in the cardiology clinic on 11/28/2022; notes reviewed. At  the time of his clinic visit, patient doing well overall from a cardiovascular perspective.  Patient with complaints of chronic exertional dyspnea with short distance ambulation.  Symptoms reported to be stable and at baseline. Patient denied any chest pain,  PND, orthopnea, palpitations, significant peripheral edema, weakness, fatigue, vertiginous symptoms, or presyncope/syncope. Patient with a past medical history significant for cardiovascular diagnoses. Documented physical exam was grossly benign, providing no evidence of acute exacerbation and/or decompensation of the patient's known cardiovascular conditions.  Patient underwent diagnostic LEFT heart catheterization on 10/12/2015 revealing minimal nonobstructive coronary artery disease; 10% ostial LM and minor luminal irregularities within the LAD, LCx, and RCA  Given the nonobstructive nature of his known disease, no intervention was indicated.  Medical management was recommended.  Patient underwent implanted loop recorder device placement on 12/03/2016.  Today, patient with no significant arrhythmias or sustained pauses.  Myocardial perfusion imaging study was performed on 11/22/2019 revealing normal left ventricular systolic function with an EF of >55%.  There was no evidence of stress-induced myocardial ischemia or arrhythmia; no scintigraphic evidence of scar.  Study determined to be normal and low risk.  Most recent TTE was performed on 02/05/2022 revealing a normal left ventricular systolic function with an EF of 60 to 65%. There were no regional wall motion abnormalities. Left ventricular diastolic Doppler parameters consistent with pseudonormalization (G2DD). Right ventricular size and function normal.  There was no significant valvular regurgitation observed. All transvalvular gradients were noted to be normal providing no evidence suggestive of valvular stenosis.  Aorta noted to be normal in size with no evidence of aneurysmal  dilatation.  Blood pressure well controlled at 120/64 mmHg on currently prescribed beta-blocker (atenolol) monotherapy. Patient is on atorvastatin + ezetimibe for his HLD diagnosis and ASCVD prevention.  T2DM well controlled on currently prescribed regimen; last HgbA1c was 6.3% when checked on 12/25/2022. He does have an OSAH diagnosis and is reported to be compliant with prescribed nocturnal PAP therapy. Functional capacity somewhat limited by activity.  Again, patient experiences exertional dyspnea associated with short distance ambulation.  Patient reported that he has been working at [employer], doing landscaping, and working on his car without significant cardiovascular limitation.  With that being said, DASI defined functional capacity documented as being >4 METS of physical activity without experiencing significant angina/anginal equivalent symptoms.  No changes were made to his medication regimen.  Patient to follow-up with outpatient cardiology in 1 year or sooner if needed.  [name] is scheduled for an elective LEFT HIP ENDOSCOPIC TROCHANTERIC BURSECTOMY, IT BAND RELEASE on 04/03/2023 with Dr. Signa Kell, MD. Given patient's past medical history significant for cardiovascular diagnoses, presurgical cardiac clearance was sought by the PAT team. Per cardiology, "this patient is optimized for surgery and may proceed with the planned procedural course with a MODERATE risk of significant perioperative cardiovascular complications".   In review of his medication reconciliation, it is noted that patient is currently on prescribed daily antithrombotic therapy.  Given his significant cardiovascular history, patient has been instructed on recommendations from his cardiologist for continuing his daily low-dose ASA throughout his perioperative course.  Patient denies previous perioperative complications with anesthesia in the past. In review of the available records, it is noted that patient underwent a  general anesthetic course here at [facility] ([facility] in 02/2023 without documented complications.      04/01/2023    9:13 AM 03/11/2023   10:26 AM 03/11/2023   10:21 AM  Vitals with BMI  Height 5\' 5"     Weight 216 lbs    BMI 35.94    Systolic  110 89  Diastolic  62 46    Providers/Specialists:   NOTE: Primary physician provider listed below. Patient may have been seen by APP or partner within same practice.   PROVIDER ROLE / SPECIALTY LAST Sarina Ser, MD Orthopedics (Surgeon) ???  Mick Sell, MD Primary Care Provider 03/17/2023  Tiajuana Amass, MD Cardiology 11/28/2022  Gerrie Nordmann, MD Rheumatology 03/25/2023  Vida Rigger, MD Pulmonary Medicine 03/03/2023   Allergies:  Dust mite extract and Gramineae pollens  Current Home Medications:   No current facility-administered medications for this encounter.    albuterol (ACCUNEB) 1.25 MG/3ML nebulizer solution   albuterol (VENTOLIN HFA) 108 (90 Base) MCG/ACT inhaler   aspirin 81 MG EC tablet   atenolol (TENORMIN) 25 MG tablet   atorvastatin (LIPITOR) 40 MG tablet   Budeson-Glycopyrrol-Formoterol (BREZTRI AEROSPHERE) 160-9-4.8 MCG/ACT AERO   donepezil (ARICEPT) 5 MG tablet   dupilumab (DUPIXENT) 300 MG/2ML prefilled syringe   escitalopram (LEXAPRO) 20 MG tablet   ezetimibe (ZETIA) 10 MG tablet   ibuprofen (ADVIL) 200 MG tablet   ipratropium-albuterol (DUONEB) 0.5-2.5 (3) MG/3ML SOLN   lamoTRIgine (LAMICTAL) 200 MG tablet   meloxicam (MOBIC) 15 MG tablet   OLANZapine (ZYPREXA) 2.5 MG tablet   pantoprazole (PROTONIX) 20 MG tablet   History:   Past Medical History:  Diagnosis Date   Anemia    Anxiety    Aortic atherosclerosis (HCC)    Asthma    Cardiomegaly    Colon polyps    COPD (chronic obstructive pulmonary disease) (HCC)    Coronary artery disease 10/12/2015   a.) LHC 10/12/2015: 10% oLM, mino luminal irregs in LAD, LCx,  RCA - med mgmt; b.) MPI 11/22/2019: no  ischemia   Depression    a.) on multi-agent Tx using escitoalopram + lamotrigene + olanzapine   Diastolic dysfunction    a.) TTE 02/05/2022: EF 60-65%, no RWMAs, G2DD   Emphysema lung (HCC)    Enthesopathy of hip region on both sides    GERD (gastroesophageal reflux disease)    Hepatic steatosis    Hiatal hernia    Hyperlipidemia 2022   Hypertension    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 HTN, vascular disease history, T2DM);  b.) rate/rhythm maintained on oral atenolol; no chronic anticoagulation   PSVT (paroxysmal supraventricular tachycardia)    Renal cyst    Sjogren's disease (HCC)    T2DM (type 2 diabetes mellitus) (HCC)    Trochanteric bursitis of left hip    Vertigo    Past Surgical History:  Procedure Laterality Date   COLONOSCOPY     COLONOSCOPY N/A 03/11/2023   Procedure: COLONOSCOPY;  Surgeon: Toledo, Boykin Nearing, MD;  Location: [facility] ENDOSCOPY;  Service: Gastroenterology;  Laterality: N/A;   ESOPHAGOGASTRODUODENOSCOPY     ESOPHAGOGASTRODUODENOSCOPY N/A 03/11/2023   Procedure: ESOPHAGOGASTRODUODENOSCOPY (EGD);  Surgeon: Toledo, Boykin Nearing, MD;  Location: [facility] ENDOSCOPY;  Service: Gastroenterology;  Laterality: N/A;   HERNIA REPAIR     KNEE ARTHROSCOPY     LEFT HEART CATH AND CORONARY ANGIOGRAPHY Left 10/12/2015   LOOP RECORDER INSERTION N/A 12/03/2016   TOTAL KNEE ARTHROPLASTY Right    No family history on file. Social History   Tobacco Use   Smoking status: Former    Types: Cigarettes   Smokeless tobacco: Never  Vaping Use   Vaping Use: Never used  Substance Use Topics   Alcohol use: Not Currently   Drug use: Not Currently    Pertinent  Clinical Results:  LABS:   Hospital Outpatient Visit on 04/01/2023  Component Date Value Ref Range Status   Sodium 04/01/2023 133 (L)  135 - 145 mmol/L Final   Potassium 04/01/2023 3.8  3.5 - 5.1 mmol/L Final   Chloride 04/01/2023 104  98 - 111 mmol/L Final   CO2 04/01/2023 20 (L)  22 - 32 mmol/L Final   Glucose, Bld 04/01/2023 93  70 - 99 mg/dL Final   Glucose reference range applies only to samples taken after fasting for at least 8 hours.   BUN 04/01/2023 16  8 - 23 mg/dL Final   Creatinine, Ser 04/01/2023 0.99  0.61 - 1.24 mg/dL Final   Calcium 16/07/9603 8.2 (L)  8.9 - 10.3 mg/dL Final   GFR, Estimated 04/01/2023 >60  >60 mL/min Final   Comment: (NOTE) Calculated using the CKD-EPI Creatinine Equation (2021)    Anion gap 04/01/2023 9  5 - 15 Final   Performed at [facility], [address] [place], [place] [address]   WBC 04/01/2023 7.4  4.0 - 10.5 K/uL Final   RBC 04/01/2023 4.13 (L)  4.22 - 5.81 MIL/uL Final   Hemoglobin 04/01/2023 13.8  13.0 - 17.0 g/dL Final   HCT 11/91/4782 40.4  39.0 - 52.0 % Final   MCV 04/01/2023 97.8  80.0 - 100.0  fL Final   MCH 04/01/2023 33.4  26.0 - 34.0 pg Final   MCHC 04/01/2023 34.2  30.0 - 36.0 g/dL Final   RDW 16/07/9603 13.2  11.5 - 15.5 % Final   Platelets 04/01/2023 237  150 - 400 K/uL Final   nRBC 04/01/2023 0.0  0.0 - 0.2 % Final   Performed at [facility], [address] [place], [place] [address]    ECG: Date: 04/01/2023 Time ECG obtained: 1147 AM Rate: 53 bpm Rhythm: sinus bradycardia Axis (leads I and aVF): Normal Intervals: PR 178 ms. QRS 100 ms. QTc 422 ms. ST segment and T wave changes: No evidence of acute ST segment elevation or depression Comparison: Similar to previous tracing obtained on 02/15/2022   IMAGING / PROCEDURES: MR HIP LEFT WO CONTRAST performed on 03/07/2023 Mild-to-moderate left hip osteoarthritis with degenerative superior labral tearing and adjacent paralabral cyst. Tendinosis  and low-grade partial tearing of the distal left gluteus minimus tendon with associated peritrochanteric edema. Similar findings in the right hip. Heterogeneous peripheral zone of the prostate gland with T2 hypointense nodules on the left measuring 6 and 8 mm. Recommend correlation with exam and PSA.  PULMONARY FUNCTION TESTING performed on 03/26/2022    Latest Ref Rng & Units 03/26/2022   10:29 AM  PFT Results  FVC-Pre L 3.58   FVC-Predicted Pre % 95   FVC-Post L 3.37   FVC-Predicted Post % 89   Pre FEV1/FVC % % 82   Post FEV1/FCV % % 76   FEV1-Pre L 2.94   FEV1-Predicted Pre % 105   FEV1-Post L 2.55    CT ABDOMEN PELVIS W CONTRAST  performed on 02/15/2022 No evidence of pulmonary arterial embolus. Cardiomegaly without evidence of CHF. Aortic and coronary artery atherosclerosis.  No AAA. New finding of nondisplaced right posterolateral seventh and eighth rib fractures and displaced right posterolateral ninth rib fracture, trace right pleural effusion or hemothorax without pneumothorax, and increased opacity in the right lower lobe which could be atelectasis, contusions or pneumonia. 1.3 x 0.7 cm nodular opacity in the posterior basal left lower lobe, seen on the most recent exam but not on last year's study. Agree with prior recommendation for three-month follow-up noncontrast CT to ensure resolution. Reference: Radiology 2017; [id number]. Hiatal hernia with mild thickening of the distal thoracic esophagus. Correlate clinically for reflux esophagitis. There appear to be early cirrhotic changes in the liver with steatosis, mildly prominent hepatic portal vein. There is no splenomegaly or ascites.  Constipation, with terminal ileal fecal back up. No bowel obstruction or inflammation. Uncomplicated diverticulosis. Osteopenia, degenerative and DISH changes. No concerning regional bone lesion. Stranding in the right lateral chest and abdominal wall but no space-occupying hematoma, no free  hemorrhage or free air.  TRANSTHORACIC ECHOCARDIOGRAM performed on 02/05/2022 Left ventricular ejection fraction, by estimation, is 60 to 65%. The left ventricle has normal function. The left ventricle has no regional  wall motion abnormalities. Left ventricular diastolic parameters are  consistent with Grade II diastolic  dysfunction (pseudonormalization).  Right ventricular systolic function is normal. The right ventricular size is not well visualized.  The mitral valve is normal in structure. No evidence of mitral valve regurgitation.  The aortic valve is tricuspid. Aortic valve regurgitation is not visualized.   Impression and Plan:  ARIF STOLTMAN has been referred for pre-anesthesia review and clearance prior to him undergoing the planned anesthetic and procedural courses. Available labs, pertinent testing, and imaging results were personally reviewed by me in preparation for  upcoming operative/procedural course. [facility] medical record has been updated following extensive record review and patient interview with PAT staff.   This patient has been appropriately cleared by cardiology with an overall MODERATE risk of experiencing significant perioperative cardiovascular complications. Based on clinical review performed today (04/02/23), barring any significant acute changes in the patient's overall condition, it is anticipated that he will be able to proceed with the planned surgical intervention. Any acute changes in clinical condition may necessitate his procedure being postponed and/or cancelled. Patient will meet with anesthesia team (MD and/or CRNA) on the day of his procedure for preoperative evaluation/assessment. Questions regarding anesthetic course will be fielded at that time.   Pre-surgical instructions were reviewed with the patient during his PAT appointment, and questions were fielded to satisfaction by PAT clinical staff. He has been instructed on which medications that he will  need to hold prior to surgery, as well as the ones that have been deemed safe/appropriate to take on the day of his procedure. As part of the general education provided by PAT, patient made aware both verbally and in writing, that he would need to abstain from the use of any illegal substances during his perioperative course.  He was advised that failure to follow the provided instructions could necessitate case cancellation or result in serious perioperative complications up to and including death. Patient encouraged to contact PAT and/or his surgeon's office to discuss any questions or concerns that may arise prior to surgery; verbalized understanding.   Quentin Mulling, MSN, APRN, FNP-C, CEN [facility]  Peri-operative Services Nurse Practitioner Phone: [phone] Fax: [phone] 04/02/23 10:57 AM  NOTE: This note has been prepared using Dragon dictation software. Despite my best ability to proofread, there is always the potential that unintentional transcriptional errors may still occur from this process.

## 2023-04-02 MED ORDER — LACTATED RINGERS IV SOLN: INTRAVENOUS | Status: DC

## 2023-04-02 MED ORDER — ORAL CARE MOUTH RINSE: 15.0000 mL | Freq: Once | OROMUCOSAL | Status: AC

## 2023-04-02 MED ORDER — CHLORHEXIDINE GLUCONATE 0.12 % MT SOLN
15.0000 mL | Freq: Once | OROMUCOSAL | Status: AC
  Administered 2023-04-03: 15 mL via OROMUCOSAL

## 2023-04-02 MED ORDER — CEFAZOLIN SODIUM-DEXTROSE 2-4 GM/100ML-% IV SOLN
2.0000 g | INTRAVENOUS | Status: AC
  Administered 2023-04-03: 2 g via INTRAVENOUS

## 2023-04-03 ENCOUNTER — Ambulatory Visit
Admission: RE | Admit: 2023-04-03 | Discharge: 2023-04-03 | Disposition: A | Discharge status: Home / Self Care | Payer: Medicare HMO | Attending: Orthopedic Surgery | Admitting: Orthopedic Surgery

## 2023-04-03 ENCOUNTER — Ambulatory Visit: Payer: Medicare HMO | Admitting: Urgent Care

## 2023-04-03 ENCOUNTER — Ambulatory Visit: Payer: Medicare HMO

## 2023-04-03 ENCOUNTER — Encounter: Payer: Self-pay | Admitting: Orthopedic Surgery

## 2023-04-03 ENCOUNTER — Other Ambulatory Visit: Payer: Self-pay

## 2023-04-03 ENCOUNTER — Encounter
Admission: RE | Disposition: A | Discharge status: Home / Self Care | Payer: Self-pay | Source: Home / Self Care | Attending: Orthopedic Surgery

## 2023-04-03 DIAGNOSIS — J4489 Other specified chronic obstructive pulmonary disease: Secondary | ICD-10-CM | POA: Diagnosis not present

## 2023-04-03 DIAGNOSIS — E119 Type 2 diabetes mellitus without complications: Secondary | ICD-10-CM | POA: Diagnosis not present

## 2023-04-03 DIAGNOSIS — E785 Hyperlipidemia, unspecified: Secondary | ICD-10-CM | POA: Insufficient documentation

## 2023-04-03 DIAGNOSIS — I4891 Unspecified atrial fibrillation: Secondary | ICD-10-CM | POA: Diagnosis not present

## 2023-04-03 DIAGNOSIS — M7062 Trochanteric bursitis, left hip: Secondary | ICD-10-CM | POA: Insufficient documentation

## 2023-04-03 DIAGNOSIS — X58XXXA Exposure to other specified factors, initial encounter: Secondary | ICD-10-CM | POA: Insufficient documentation

## 2023-04-03 DIAGNOSIS — I2510 Atherosclerotic heart disease of native coronary artery without angina pectoris: Secondary | ICD-10-CM | POA: Insufficient documentation

## 2023-04-03 DIAGNOSIS — K219 Gastro-esophageal reflux disease without esophagitis: Secondary | ICD-10-CM | POA: Diagnosis not present

## 2023-04-03 DIAGNOSIS — S76312A Strain of muscle, fascia and tendon of the posterior muscle group at thigh level, left thigh, initial encounter: Secondary | ICD-10-CM | POA: Insufficient documentation

## 2023-04-03 HISTORY — DX: Long term (current) use of aspirin: Z79.82

## 2023-04-03 HISTORY — DX: Obstructive sleep apnea (adult) (pediatric): G47.33

## 2023-04-03 HISTORY — DX: Dizziness and giddiness: R42

## 2023-04-03 HISTORY — DX: Depression, unspecified: F32.A

## 2023-04-03 HISTORY — DX: Type 2 diabetes mellitus without complications: E11.9

## 2023-04-03 HISTORY — DX: Anxiety disorder, unspecified: F41.9

## 2023-04-03 HISTORY — DX: Trochanteric bursitis, left hip: M70.62

## 2023-04-03 HISTORY — DX: Atherosclerosis of aorta: I70.0

## 2023-04-03 HISTORY — DX: Sjogren syndrome, unspecified: M35.00

## 2023-04-03 HISTORY — DX: Other ill-defined heart diseases: I51.89

## 2023-04-03 HISTORY — DX: Polyp of colon: K63.5

## 2023-04-03 HISTORY — DX: Fatty (change of) liver, not elsewhere classified: K76.0

## 2023-04-03 HISTORY — DX: Cyst of kidney, acquired: N28.1

## 2023-04-03 HISTORY — DX: Other specified enthesopathies of left lower limb, excluding foot: M76.891

## 2023-04-03 HISTORY — DX: Diaphragmatic hernia without obstruction or gangrene: K44.9

## 2023-04-03 HISTORY — DX: Supraventricular tachycardia, unspecified: I47.10

## 2023-04-03 HISTORY — DX: Long term (current) use of immunosuppressive biologic: Z79.620

## 2023-04-03 HISTORY — DX: Age-related osteoporosis without current pathological fracture: M81.0

## 2023-04-03 HISTORY — DX: Paroxysmal atrial fibrillation: I48.0

## 2023-04-03 HISTORY — DX: Cardiomegaly: I51.7

## 2023-04-03 HISTORY — DX: Mild cognitive impairment of uncertain or unknown etiology: G31.84

## 2023-04-03 HISTORY — DX: Unspecified osteoarthritis, unspecified site: M19.90

## 2023-04-03 SURGERY — ARTHROSCOPIC TROCHANTERIC BURSECTOMY
Anesthesia: General | Site: Hip | Laterality: Left

## 2023-04-03 MED ORDER — FENTANYL CITRATE (PF) 100 MCG/2ML IJ SOLN
INTRAMUSCULAR | Status: DC | PRN
  Administered 2023-04-03 (×4): 50 ug via INTRAVENOUS

## 2023-04-03 MED ORDER — LIDOCAINE-EPINEPHRINE 1 %-1:100000 IJ SOLN
INTRAMUSCULAR | Status: AC
  Filled 2023-04-03: qty 1

## 2023-04-03 MED ORDER — PROMETHAZINE HCL 25 MG/ML IJ SOLN: 6.2500 mg | INTRAMUSCULAR | Status: DC | PRN

## 2023-04-03 MED ORDER — LIDOCAINE-EPINEPHRINE 1 %-1:100000 IJ SOLN
INTRAMUSCULAR | Status: DC | PRN
  Administered 2023-04-03: 24 mL

## 2023-04-03 MED ORDER — FENTANYL CITRATE (PF) 100 MCG/2ML IJ SOLN
INTRAMUSCULAR | Status: AC
  Filled 2023-04-03: qty 2

## 2023-04-03 MED ORDER — KETAMINE HCL 50 MG/5ML IJ SOSY
PREFILLED_SYRINGE | INTRAMUSCULAR | Status: AC
  Filled 2023-04-03: qty 5

## 2023-04-03 MED ORDER — CHLORHEXIDINE GLUCONATE 0.12 % MT SOLN
OROMUCOSAL | Status: AC
  Filled 2023-04-03: qty 15

## 2023-04-03 MED ORDER — OXYCODONE HCL 5 MG PO TABS: 5.0000 mg | ORAL_TABLET | ORAL | 0 refills | Status: DC | PRN

## 2023-04-03 MED ORDER — FENTANYL CITRATE (PF) 100 MCG/2ML IJ SOLN
25.0000 ug | INTRAMUSCULAR | Status: DC | PRN
  Administered 2023-04-03 (×2): 25 ug via INTRAVENOUS
  Administered 2023-04-03: 50 ug via INTRAVENOUS

## 2023-04-03 MED ORDER — LACTATED RINGERS IR SOLN
Status: DC | PRN
  Administered 2023-04-03: 12004 mL

## 2023-04-03 MED ORDER — ROCURONIUM BROMIDE 100 MG/10ML IV SOLN
INTRAVENOUS | Status: DC | PRN
  Administered 2023-04-03: 50 mg via INTRAVENOUS
  Administered 2023-04-03 (×3): 20 mg via INTRAVENOUS

## 2023-04-03 MED ORDER — ASPIRIN 325 MG PO TBEC: 325.0000 mg | DELAYED_RELEASE_TABLET | Freq: Every day | ORAL | 0 refills | Status: AC

## 2023-04-03 MED ORDER — OXYCODONE HCL 5 MG/5ML PO SOLN: 5.0000 mg | Freq: Once | ORAL | Status: AC | PRN

## 2023-04-03 MED ORDER — LIDOCAINE HCL (CARDIAC) PF 100 MG/5ML IV SOSY
PREFILLED_SYRINGE | INTRAVENOUS | Status: DC | PRN
  Administered 2023-04-03: 80 mg via INTRAVENOUS

## 2023-04-03 MED ORDER — CEFAZOLIN SODIUM-DEXTROSE 2-4 GM/100ML-% IV SOLN
INTRAVENOUS | Status: AC
  Filled 2023-04-03: qty 100

## 2023-04-03 MED ORDER — PROPOFOL 10 MG/ML IV BOLUS
INTRAVENOUS | Status: DC | PRN
  Administered 2023-04-03: 130 mg via INTRAVENOUS

## 2023-04-03 MED ORDER — ONDANSETRON HCL 4 MG/2ML IJ SOLN
INTRAMUSCULAR | Status: DC | PRN
  Administered 2023-04-03: 4 mg via INTRAVENOUS

## 2023-04-03 MED ORDER — EPHEDRINE SULFATE (PRESSORS) 50 MG/ML IJ SOLN
INTRAMUSCULAR | Status: DC | PRN
  Administered 2023-04-03 (×3): 5 mg via INTRAVENOUS

## 2023-04-03 MED ORDER — DROPERIDOL 2.5 MG/ML IJ SOLN: 0.6250 mg | Freq: Once | INTRAMUSCULAR | Status: DC | PRN

## 2023-04-03 MED ORDER — OXYCODONE HCL 5 MG PO TABS
5.0000 mg | ORAL_TABLET | Freq: Once | ORAL | Status: AC | PRN
  Administered 2023-04-03: 5 mg via ORAL

## 2023-04-03 MED ORDER — ACETAMINOPHEN 500 MG PO TABS: 1000.0000 mg | ORAL_TABLET | Freq: Three times a day (TID) | ORAL | 2 refills | Status: DC

## 2023-04-03 MED ORDER — LACTATED RINGERS IR SOLN
Status: DC | PRN
  Administered 2023-04-03 (×3): 6000 mL

## 2023-04-03 MED ORDER — KETAMINE HCL 10 MG/ML IJ SOLN
INTRAMUSCULAR | Status: DC | PRN
  Administered 2023-04-03 (×2): 10 mg via INTRAVENOUS
  Administered 2023-04-03: 20 mg via INTRAVENOUS
  Administered 2023-04-03: 10 mg via INTRAVENOUS

## 2023-04-03 MED ORDER — BUPIVACAINE HCL (PF) 0.5 % IJ SOLN
INTRAMUSCULAR | Status: AC
  Filled 2023-04-03: qty 30

## 2023-04-03 MED ORDER — ACETAMINOPHEN 500 MG PO TABS
ORAL_TABLET | ORAL | Status: AC
  Filled 2023-04-03: qty 2

## 2023-04-03 MED ORDER — SUGAMMADEX SODIUM 200 MG/2ML IV SOLN
INTRAVENOUS | Status: DC | PRN
  Administered 2023-04-03: 200 mg via INTRAVENOUS

## 2023-04-03 MED ORDER — EPINEPHRINE PF 1 MG/ML IJ SOLN
INTRAMUSCULAR | Status: AC
  Filled 2023-04-03: qty 1

## 2023-04-03 MED ORDER — ACETAMINOPHEN 10 MG/ML IV SOLN: 1000.0000 mg | Freq: Once | INTRAVENOUS | Status: DC | PRN

## 2023-04-03 MED ORDER — DEXAMETHASONE SODIUM PHOSPHATE 10 MG/ML IJ SOLN
INTRAMUSCULAR | Status: DC | PRN
  Administered 2023-04-03: 10 mg via INTRAVENOUS

## 2023-04-03 MED ORDER — GLYCOPYRROLATE 0.2 MG/ML IJ SOLN
INTRAMUSCULAR | Status: DC | PRN
  Administered 2023-04-03 (×2): .2 mg via INTRAVENOUS

## 2023-04-03 MED ORDER — PHENYLEPHRINE 80 MCG/ML (10ML) SYRINGE FOR IV PUSH (FOR BLOOD PRESSURE SUPPORT)
PREFILLED_SYRINGE | INTRAVENOUS | Status: DC | PRN
  Administered 2023-04-03: 80 ug via INTRAVENOUS

## 2023-04-03 MED ORDER — ONDANSETRON 4 MG PO TBDP: 4.0000 mg | ORAL_TABLET | Freq: Three times a day (TID) | ORAL | 0 refills | Status: DC | PRN

## 2023-04-03 MED ORDER — OXYCODONE HCL 5 MG PO TABS
ORAL_TABLET | ORAL | Status: AC
  Filled 2023-04-03: qty 1

## 2023-04-03 MED ORDER — ACETAMINOPHEN 500 MG PO TABS
1000.0000 mg | ORAL_TABLET | Freq: Once | ORAL | Status: AC
  Administered 2023-04-03: 1000 mg via ORAL

## 2023-04-03 SURGICAL SUPPLY — 50 items
ADAPTER IRRIG TUBE 2 SPIKE SOL (ADAPTER) ×2 IMPLANT
ADPR TBG 2 SPK PMP STRL ASCP (ADAPTER) ×2
ANCH SUT 1.4 SUT TPE BLK/WHT (SUTURE) ×1
ANCH SUT SWLK 19.1X4.75 (Anchor) ×1 IMPLANT
ANCHOR SUT BIO SW 4.75X19.1 (Anchor) IMPLANT
APL PRP STRL LF DISP 70% ISPRP (MISCELLANEOUS) ×1
BLADE FULL RADIUS 3.5 (BLADE) IMPLANT
BLADE SHAVER 4.5X7 STR FR (MISCELLANEOUS) IMPLANT
BNDG ADH 1X3 SHEER STRL LF (GAUZE/BANDAGES/DRESSINGS) ×6 IMPLANT
BNDG ADH THN 3X1 STRL LF (GAUZE/BANDAGES/DRESSINGS) ×1
BUR BR 5.5 WIDE MOUTH (BURR) IMPLANT
CANNULA TWIST IN 8.25X7CM (CANNULA) ×1 IMPLANT
CANNULA TWIST IN 8.25X9CM (CANNULA) IMPLANT
CHLORAPREP W/TINT 26 (MISCELLANEOUS) ×1 IMPLANT
DRAPE C-ARM XRAY 36X54 (DRAPES) ×1 IMPLANT
DRAPE IMP U-DRAPE 54X76 (DRAPES) ×1 IMPLANT
DRAPE U-SHAPE 47X51 STRL (DRAPES) ×2 IMPLANT
GAUZE SPONGE 4X4 12PLY STRL (GAUZE/BANDAGES/DRESSINGS) ×1 IMPLANT
GAUZE XEROFORM 1X8 LF (GAUZE/BANDAGES/DRESSINGS) ×1 IMPLANT
GLOVE BIO SURGEON STRL SZ7.5 (GLOVE) ×2 IMPLANT
GLOVE BIO SURGEON STRL SZ8 (GLOVE) ×2 IMPLANT
GLOVE BIOGEL PI IND STRL 8 (GLOVE) ×1 IMPLANT
GLOVE INDICATOR 8.0 STRL GRN (GLOVE) ×1 IMPLANT
GLOVE SURG ORTHO 8.0 STRL STRW (GLOVE) ×1 IMPLANT
GLOVE SURG SYN 7.5 E (GLOVE) ×1 IMPLANT
GLOVE SURG SYN 7.5 PF PI (GLOVE) ×1 IMPLANT
GOWN STRL REUS W/ TWL LRG LVL3 (GOWN DISPOSABLE) ×1 IMPLANT
GOWN STRL REUS W/ TWL XL LVL3 (GOWN DISPOSABLE) ×1 IMPLANT
GOWN STRL REUS W/TWL LRG LVL3 (GOWN DISPOSABLE) ×2
GOWN STRL REUS W/TWL XL LVL3 (GOWN DISPOSABLE) ×3
IV NS IRRIG 3000ML ARTHROMATIC (IV SOLUTION) ×4 IMPLANT
KIT TURNOVER KIT A (KITS) ×1 IMPLANT
MANIFOLD NEPTUNE II (INSTRUMENTS) ×1 IMPLANT
MAT ABSORB FLUID 56X50 GRAY (MISCELLANEOUS) ×1 IMPLANT
NDL HYPO 22X1.5 SAFETY MO (MISCELLANEOUS) ×1 IMPLANT
NEEDLE HYPO 22X1.5 SAFETY MO (MISCELLANEOUS) ×1 IMPLANT
NS IRRIG 500ML POUR BTL (IV SOLUTION) ×1 IMPLANT
PACK EXTREMITY ARMC (MISCELLANEOUS) ×1 IMPLANT
PAD ABD DERMACEA PRESS 5X9 (GAUZE/BANDAGES/DRESSINGS) ×1 IMPLANT
PASSER SUT FIRSTPASS SELF (INSTRUMENTS) IMPLANT
SPONGE T-LAP 18X18 ~~LOC~~+RFID (SPONGE) ×1 IMPLANT
SUT ETHILON 3-0 FS-10 30 BLK (SUTURE) ×1
SUT VIC AB 2-0 CT2 27 (SUTURE) ×1 IMPLANT
SUT XBRAID 1.4 BLK/WHT (SUTURE) IMPLANT
SUTURE EHLN 3-0 FS-10 30 BLK (SUTURE) ×1 IMPLANT
SYR 20ML LL LF (SYRINGE) ×1 IMPLANT
TRAP FLUID SMOKE EVACUATOR (MISCELLANEOUS) ×1 IMPLANT
TUBE SET DOUBLEFLO INFLOW (TUBING) ×1 IMPLANT
TUBE SET DOUBLEFLO OUTFLOW (TUBING) ×1 IMPLANT
WAND HAND CNTRL MULTIVAC 50 (MISCELLANEOUS) ×1 IMPLANT

## 2023-04-03 NOTE — H&P (Signed)
Paper H&P to be scanned into permanent record. H&P reviewed. No significant changes noted.  

## 2023-04-03 NOTE — TOC Progression Note (Signed)
Transition of Care [facility]) - Progression Note    Patient Details  Name: Jesus Mckinney MRN: [id number] Date of Birth: 1956-04-21  Transition of Care [facility]) CM/SW Contact  Marlowe Sax, RN Phone Number: 04/03/2023, 4:37 PM  Clinical Narrative:     Patgient needs a rolling walker, and requested it to be delivered to the home, I notified Barbara Cower with [employer] and requested       Expected Discharge Plan and Services         Expected Discharge Date: 04/03/23                                     Social Determinants of Health (SDOH) Interventions SDOH Screenings   Tobacco Use: Medium Risk (04/03/2023)    Readmission Risk Interventions    02/06/2022   10:08 AM  Readmission Risk Prevention Plan  Transportation Screening Complete  PCP or Specialist Appt within 3-5 Days Complete  HRI or Home Care Consult Complete  Social Work Consult for Recovery Care Planning/Counseling Complete  Palliative Care Screening Not Applicable  Medication Review Oceanographer) Referral to Pharmacy

## 2023-04-03 NOTE — Transfer of Care (Signed)
Immediate Anesthesia Transfer of Care Note  Patient: Jesus Mckinney  Procedure(s) Performed: Left hip endoscopic trochanteric bursectomy, IT band release (Left: Hip)  Patient Location: PACU  Anesthesia Type:General  Level of Consciousness: drowsy  Airway & Oxygen Therapy: Patient Spontanous Breathing and Patient connected to face mask oxygen  Post-op Assessment: Report given to RN  Post vital signs: stable  Last Vitals:  Vitals Value Taken Time  BP 122/46 04/03/23 1445  Temp    Pulse 67 04/03/23 1449  Resp 8 04/03/23 1449  SpO2 99 % 04/03/23 1449  Vitals shown include unvalidated device data.  Last Pain:  Vitals:   04/03/23 1045  TempSrc: Temporal  PainSc: 5          Complications: No notable events documented.

## 2023-04-03 NOTE — Discharge Instructions (Addendum)
Endoscopic Trochanteric Bursectomy/Hip Abductor Repaair   Post-Op Instructions   1. Bracing or crutches: Crutches will be provided at the time of discharge from the surgery center if you do not already have them. You can use a walker if you prefer.    2. Ice: You may be provided with a device [facility]) that allows you to ice the affected area effectively. Otherwise you can ice manually.    3. Driving:  Plan on not driving for at least one week. Please note that you are advised NOT to drive while taking narcotic pain medications as you may be impaired and unsafe to drive.   4. Activity: Ankle pumps several times an hour while awake to prevent blood clots. Weight bearing: FLAT FOOT WEIGHT BEARING (weight of leg only for balance) x 2 weeks. Can then advance to weight bearing as tolerated from weeks 2-4. Wean from crutches/walker after 4 weeks. . Use crutches/walker as needed (usually 1-2 weeks) until pain allows you to ambulate without a limp. Avoid standing more than 5 minutes (consecutively) for the first week.  Avoid long distance travel for 2 weeks.   5. Medications:  - You have been provided a prescription for narcotic pain medicine. After surgery, take 1-2 narcotic tablets every 4 hours if needed for severe pain.  - You may take up to 3000mg /day of tylenol (acetaminophen). You can take 1000mg  3x/day. Please check your narcotic. If you have acetaminophen in your narcotic (each tablet will be 325mg ), be careful not to exceed a total of 3000mg /day of acetaminophen.  - A prescription for anti-nausea medication will be provided in case the narcotic medicine causes nausea - take 1 tablet every 6 hours only if nauseated.  - Continue baseline meloxicam 15mg /day  - Take enteric coated aspirin 325 mg once daily for 2 weeks to prevent blood clots.   6. Bandages: You may remove the bandages after 5 days. Drainage from the bandages (clear/reddish) can frequently occur. If this does occur, you may remove  the dressing and apply another sterile dressing. You can shower after bandages are removed.    7. Physical Therapy: not needed   8. Work: May return to full work usually around 2 weeks after 1st post-operative visit. May do light duty/desk job in approximately 1-2 weeks when off of narcotics, pain is well-controlled, and swelling has decreased. Labor intensive jobs may require 4-6 weeks to return.      9. Post-Op Appointments: Your first post-op appointment will be with Dr. Allena Katz in approximately 2 weeks time.    If you find that they have not been scheduled please call the Orthopaedic Appointment front desk at [phone].  AMBULATORY SURGERY  DISCHARGE INSTRUCTIONS   The drugs that you were given will stay in your system until tomorrow so for the next 24 hours you should not:  Drive an automobile Make any legal decisions Drink any alcoholic beverage   You may resume regular meals tomorrow.  Today it is better to start with liquids and gradually work up to solid foods.  You may eat anything you prefer, but it is better to start with liquids, then soup and crackers, and gradually work up to solid foods.   Please notify your doctor immediately if you have any unusual bleeding, trouble breathing, redness and pain at the surgery site, drainage, fever, or pain not relieved by medication.    Additional Instructions:   Please contact your physician with any problems or Same Day Surgery at [phone], Monday through Friday  6 am to 4 pm, or [place] at [facility] number at [phone].

## 2023-04-03 NOTE — Anesthesia Procedure Notes (Signed)
Procedure Name: Intubation Date/Time: 04/03/2023 12:04 PM  Performed by: Jaye Beagle, CRNAPre-anesthesia Checklist: Patient identified, Emergency Drugs available, Suction available and Patient being monitored Patient Re-evaluated:Patient Re-evaluated prior to induction Oxygen Delivery Method: Circle system utilized Preoxygenation: Pre-oxygenation with 100% oxygen Induction Type: IV induction Ventilation: Mask ventilation without difficulty Laryngoscope Size: McGraph Grade View: Grade I Tube type: Oral Tube size: 7.5 mm Number of attempts: 1 Airway Equipment and Method: Stylet and Oral airway Placement Confirmation: ETT inserted through vocal cords under direct vision, positive ETCO2 and breath sounds checked- equal and bilateral Secured at: 23 cm Tube secured with: Tape Dental Injury: Teeth and Oropharynx as per pre-operative assessment

## 2023-04-03 NOTE — Op Note (Addendum)
DATE OF SURGERY:  04/03/2023   PREOPERATIVE DIAGNOSIS:  1. Left hip trochanteric bursitis 2. Left hip iliotibial band tightness   POSTOPERATIVE DIAGNOSIS:  1. Left hip partial-thickness gluteus medius tear 2. Left hip trochanteric bursitis 3. Left hip iliotibial band tightness   PROCEDURE:  1. Left hip endoscopic gluteus medius and minimus repair  2. Left hip endoscopic trochanteric bursectomy 3. Left hip endoscopic iliotibial band release  SURGEON: Rosealee Albee, MD   EBL: 5cc  ASSISTANTS: Sonny Dandy, PA; Marlene Bast Minor, PA-S   ANESTHESIA: Gen  IMPLANTS: - Arthrex SwiveLock anchor x 1   INDICATION(S): The patient is a 67 y.o. male who presents with persistent lateral sided hip pain. The MRI revealed significant trochanteric bursitis with tendinosis of the gluteus medius tendons without frank tear.  The patient has failed all non-operative care to date including multiple corticosteroid injections, physical therapy/exercises, medications, and activity modification.  Please see the preoperative notes for further detail.   The patient elected to undergo the above mentioned procedure after detailed explanation of the expected outcomes and recovery path and after discussion of risks, benefits, and alternatives to surgery  OPERATIVE FINDINGS: partial-thickness gluteus medius tear, significant trochanteric bursitis.  OPERATIVE REPORT:   The patient was brought to the operating room and underwent anesthesia. The patient was placed in a supine fashion on the fracture table.  The operative extremity was flexed approximately 10 degrees and abducted approximately 30 degrees.  The foot was internally rotated. All bony prominences were padded.  Appropriate IV antibiotics were administered.  The patient was prepped and draped in a sterile fashion.  Time-out was performed and landmarks were identified with fluoroscopic assistance.  Needle localization with fluoroscopy was used to make a standard  anterolateral portal.  The blunt trocar was inserted deep to the IT band and along the greater trochanter.  The peritrochanteric space was opened with a blunt trocar.  Appropriate positioning was confirmed with fluoroscopy.  A 70 degree knee arthroscope was used for this procedure, and it was inserted.  Next a distal anterolateral portal was established approximately 9cm distal to the anterior portal just anterior to the IT band and greater trochanter. This was also done under needle localization to ensure appropriate trajectory.  A switching stick was inserted and appropriate positioning was confirmed with fluoroscopy.  A cannula was placed over the switching stick.  A shaver was introduced.  Using combination of oscillating shaver and electrocautery wand, the significant amount of trochanteric bursa was excised.  After excision and debridement of the bursa, the gluteal sling, vastus lateralis, and gluteus medius/minimus insertion were well visualized.  There was a partial-thickness gluteus medius tear present with a sleeve of tissue pulled off the proximal and lateral aspect of the greater trochanter.  Given this finding, decision was made to perform gluteus medius repair.  An accessory anterior portal was made under spinal needle localization between the 2 existing portals.  An Arthrex 4.75 mm SwiveLock anchor double loaded with tape was placed through accessory portal into the footprint and region of exposed bone.  Sutures were passed in a side-to-side fashion and tied arthroscopically, thus reducing the tear.  Sutures were cut. Construct was stable to internal/external rotation and slight adduction/abduction.   Next, the leg was then placed in a neutral position.  The most prominent portion of the greater trochanter was adjacent to the IT band.  The IT band was then released via a longitudinal incision using an ArthroCare wand to reduce friction and irritation  in this region of prominence.  Arthroscopic fluid  was then evacuated from the joint.    Local anesthetic was injected.  Portal sites were closed with 2-0 Vicryl and 3-0 nylon sutures.  Sterile dressing was applied.  The patient was awakened from anesthesia without difficulty and transferred to PACU in stable condition.    POSTOPERATIVE PLAN:  Patient to be discharged home from PACU. FFWB x2 weeks, and then make progress towards weightbearing as tolerated. ASA 325 mg daily x2 weeks for DVT prophylaxis.  Follow-up in approximately 2 weeks for postoperative visit.

## 2023-04-03 NOTE — Anesthesia Preprocedure Evaluation (Addendum)
Anesthesia Evaluation  Patient identified by MRN, date of birth, ID band Patient awake    Reviewed: Allergy & Precautions, H&P , NPO status , Patient's Chart, lab work & pertinent test results  Airway Mallampati: III  TM Distance: >3 FB Neck ROM: full    Dental  (+) Edentulous Upper, Edentulous Lower   Pulmonary asthma , sleep apnea , COPD, former smoker severe persistent asthma with recurrent exacerbations requiring hospitalization recently treated with oral steroids.   Pulmonary exam normal        Cardiovascular Exercise Tolerance: Good hypertension, + CAD  + dysrhythmias (past episodes of afib. No reoccurance)  Rhythm:Regular Rate:Normal - Peripheral Edema    Neuro/Psych  PSYCHIATRIC DISORDERS  Depression    negative neurological ROS     GI/Hepatic Neg liver ROS,GERD  Controlled,,S/p distal esophagus dilation 03/11/2023   Endo/Other  diabetes    Renal/GU negative Renal ROS  negative genitourinary   Musculoskeletal   Abdominal  (+) + obese  Peds  Hematology negative hematology ROS (+)   Anesthesia Other Findings Past Medical History: No date: Anemia No date: Asthma No date: Atrial fibrillation (HCC) No date: COPD (chronic obstructive pulmonary disease) (HCC) No date: Coronary artery disease No date: Diabetes mellitus without complication (HCC) No date: Emphysema lung (HCC) No date: GERD (gastroesophageal reflux disease) 2022: Hyperlipidemia No date: Sleep apnea  Past Surgical History: No date: CARDIAC CATHETERIZATION No date: COLONOSCOPY No date: ESOPHAGOGASTRODUODENOSCOPY No date: HERNIA REPAIR No date: JOINT REPLACEMENT; Right     Comment:  knee No date: KNEE ARTHROSCOPY     Reproductive/Obstetrics negative OB ROS                             Anesthesia Physical Anesthesia Plan  ASA: 3  Anesthesia Plan: General   Post-op Pain Management: Tylenol PO (pre-op)* and  Ketamine IV*   Induction: Intravenous  PONV Risk Score and Plan: 2 and Ondansetron and Dexamethasone  Airway Management Planned: Oral ETT  Additional Equipment:   Intra-op Plan:   Post-operative Plan: Extubation in OR  Informed Consent: I have reviewed the patients History and Physical, chart, labs and discussed the procedure including the risks, benefits and alternatives for the proposed anesthesia with the patient or authorized representative who has indicated his/her understanding and acceptance.     Dental Advisory Given  Plan Discussed with: CRNA and Surgeon  Anesthesia Plan Comments:         Anesthesia Quick Evaluation

## 2023-04-06 NOTE — Anesthesia Postprocedure Evaluation (Signed)
Anesthesia Post Note  Patient: Jesus Mckinney  Procedure(s) Performed: Left hip endoscopic trochanteric bursectomy, IT band release (Left: Hip)  Patient location during evaluation: PACU Anesthesia Type: General Level of consciousness: awake and alert Pain management: pain level controlled Vital Signs Assessment: post-procedure vital signs reviewed and stable Respiratory status: spontaneous breathing, nonlabored ventilation and respiratory function stable Cardiovascular status: blood pressure returned to baseline and stable Postop Assessment: no apparent nausea or vomiting Anesthetic complications: no   No notable events documented.   Last Vitals:  Vitals:   04/03/23 1515 04/03/23 1551  BP: (!) 114/40 (!) 134/57  Pulse: 64 65  Resp: 17 17  Temp:  (!) 36.1 C  SpO2: 94% 93%    Last Pain:  Vitals:   04/03/23 1551  TempSrc: Temporal  PainSc: 7                  Foye Deer

## 2023-05-12 ENCOUNTER — Ambulatory Visit: Payer: Medicare HMO | Admitting: Podiatry

## 2023-05-12 ENCOUNTER — Encounter: Payer: Self-pay | Admitting: Podiatry

## 2023-05-12 VITALS — BP 134/76 | HR 50

## 2023-05-12 DIAGNOSIS — M722 Plantar fascial fibromatosis: Secondary | ICD-10-CM

## 2023-05-12 NOTE — Progress Notes (Signed)
Chief Complaint  Patient presents with   Foot Orthotics    "I don't have a problem with the Plantar Fasciitis anymore.  The Orthotics are doing good."   Nail Problem    "My big toenails are splintering off."     HPI: 67 y.o. male presenting today for follow-up evaluation of plantar fasciitis.  Patient states that with the meloxicam and orthotics he has no pain or tenderness associated to the foot.  He is doing well  Patient also has a history of partial nail matricectomy a few months prior and he states that there is some disfigurement of the nail plate where the nail matricectomy was performed.  He would like to have it evaluated  Past Medical History:  Diagnosis Date   Anemia    Anxiety    Aortic atherosclerosis (HCC)    Asthma    Cardiomegaly    Colon polyps    COPD (chronic obstructive pulmonary disease) (HCC)    Coronary artery disease 10/12/2015   a.) LHC 10/12/2015: 10% oLM, mino luminal irregs in LAD, LCx, RCA - med mgmt; b.) MPI 11/22/2019: no ischemia   Depression    a.) on multi-agent Tx using escitoalopram + lamotrigene + olanzapine   Diastolic dysfunction    a.) TTE 02/05/2022: EF 60-65%, no RWMAs, G2DD   Emphysema lung (HCC)    Enthesopathy of hip region on both sides    GERD (gastroesophageal reflux disease)    Hepatic steatosis    Hiatal hernia    Hyperlipidemia 2022   Hypertension    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 HTN, vascular disease history, T2DM);  b.) rate/rhythm maintained on oral atenolol; no chronic anticoagulation   PSVT (paroxysmal supraventricular tachycardia)    Renal cyst    Sjogren's disease (HCC)    T2DM (type 2  diabetes mellitus) (HCC)    Trochanteric bursitis of left hip    Vertigo     Past Surgical History:  Procedure Laterality Date   COLONOSCOPY     COLONOSCOPY N/A 03/11/2023   Procedure: COLONOSCOPY;  Surgeon: Toledo, Boykin Nearing, MD;  Location: [facility] ENDOSCOPY;  Service: Gastroenterology;  Laterality: N/A;   ESOPHAGOGASTRODUODENOSCOPY     ESOPHAGOGASTRODUODENOSCOPY N/A 03/11/2023   Procedure: ESOPHAGOGASTRODUODENOSCOPY (EGD);  Surgeon: Toledo, Boykin Nearing, MD;  Location: [facility] ENDOSCOPY;  Service: Gastroenterology;  Laterality: N/A;   HERNIA REPAIR     KNEE ARTHROSCOPY     LEFT HEART CATH AND CORONARY ANGIOGRAPHY Left 10/12/2015   LOOP RECORDER INSERTION N/A 12/03/2016   TOTAL KNEE ARTHROPLASTY Right     Allergies  Allergen Reactions   Dust Mite Extract Cough   Gramineae Pollens Cough     Physical Exam: General: The patient is alert and oriented x3 in no acute distress.  Dermatology: Skin is warm, dry and supple bilateral lower extremities.  There are some hyperkeratotic loosely adhered nail plate noted along the nail matricectomy site which appears stable with good healing.  Vascular: Palpable pedal pulses bilaterally. Capillary refill within normal limits.  No appreciable edema.  No erythema.  Neurological: Grossly intact via light touch  Musculoskeletal Exam: No pedal deformities noted.  No tenderness or pain with palpation to the feet bilateral   Assessment/Plan of Care: 1.  Plantar fasciitis right foot; resolved 2.  History of partial nail matricectomy bilateral great toes  -Patient evaluated -Continue orthotics  -Continue meloxicam 15 mg daily as needed -Light debridement of the nail plates was performed today with a nail nipper without incident or bleeding and smoothed with a rotary bur. -Return to clinic as needed       Felecia Shelling, DPM [facility]  Dr. Felecia Shelling, DPM    [address] [place], [place] [address]                Office [phone]  Fax [phone]

## 2023-05-25 ENCOUNTER — Other Ambulatory Visit: Payer: Self-pay | Admitting: Gastroenterology

## 2023-05-25 DIAGNOSIS — K224 Dyskinesia of esophagus: Secondary | ICD-10-CM

## 2023-05-25 DIAGNOSIS — R1314 Dysphagia, pharyngoesophageal phase: Secondary | ICD-10-CM

## 2023-05-25 DIAGNOSIS — Z8719 Personal history of other diseases of the digestive system: Secondary | ICD-10-CM

## 2023-05-27 ENCOUNTER — Ambulatory Visit
Admission: RE | Admit: 2023-05-27 | Discharge: 2023-05-27 | Disposition: A | Discharge status: Home / Self Care | Payer: Medicare HMO | Source: Ambulatory Visit | Attending: Gastroenterology | Admitting: Gastroenterology

## 2023-05-27 DIAGNOSIS — R1314 Dysphagia, pharyngoesophageal phase: Secondary | ICD-10-CM | POA: Insufficient documentation

## 2023-05-27 DIAGNOSIS — Z9889 Other specified postprocedural states: Secondary | ICD-10-CM | POA: Diagnosis present

## 2023-05-27 DIAGNOSIS — Z8719 Personal history of other diseases of the digestive system: Secondary | ICD-10-CM | POA: Diagnosis present

## 2023-05-27 DIAGNOSIS — K224 Dyskinesia of esophagus: Secondary | ICD-10-CM | POA: Insufficient documentation

## 2023-07-01 ENCOUNTER — Other Ambulatory Visit: Payer: Self-pay | Admitting: Physical Medicine & Rehabilitation

## 2023-07-01 DIAGNOSIS — G8929 Other chronic pain: Secondary | ICD-10-CM

## 2023-07-14 ENCOUNTER — Encounter: Payer: Self-pay | Admitting: Physical Medicine & Rehabilitation

## 2023-07-16 ENCOUNTER — Ambulatory Visit
Admission: RE | Admit: 2023-07-16 | Discharge: 2023-07-16 | Disposition: A | Discharge status: Home / Self Care | Payer: Medicare HMO | Source: Ambulatory Visit | Attending: Physical Medicine & Rehabilitation | Admitting: Physical Medicine & Rehabilitation

## 2023-07-16 DIAGNOSIS — G8929 Other chronic pain: Secondary | ICD-10-CM

## 2023-08-17 ENCOUNTER — Ambulatory Visit
Admission: RE | Admit: 2023-08-17 | Discharge: 2023-08-17 | Disposition: A | Discharge status: Home / Self Care | Payer: Medicare HMO | Source: Ambulatory Visit | Attending: Physical Medicine & Rehabilitation | Admitting: Physical Medicine & Rehabilitation

## 2023-08-17 ENCOUNTER — Other Ambulatory Visit: Payer: Self-pay | Admitting: Physical Medicine & Rehabilitation

## 2023-08-17 DIAGNOSIS — M5414 Radiculopathy, thoracic region: Secondary | ICD-10-CM

## 2023-08-19 ENCOUNTER — Other Ambulatory Visit: Payer: Self-pay | Admitting: Physical Medicine & Rehabilitation

## 2023-08-19 ENCOUNTER — Encounter (HOSPITAL_COMMUNITY): Payer: Self-pay | Admitting: Physical Medicine & Rehabilitation

## 2023-08-19 DIAGNOSIS — M5414 Radiculopathy, thoracic region: Secondary | ICD-10-CM

## 2023-08-19 NOTE — Progress Notes (Signed)
Patient for [facility] Thoracic Myelogram Inj/CT Thoracic Myelogram on Thurs 08/20/2023, I called and spoke with the patient on the phone and gave pre-procedure instructions. Pt was made aware to be here at 10:30a and check in at the new entrance. Pt stated understanding.  Called 08/19/2023

## 2023-08-20 ENCOUNTER — Ambulatory Visit
Admission: RE | Admit: 2023-08-20 | Discharge: 2023-08-20 | Disposition: A | Discharge status: Home / Self Care | Payer: Medicare HMO | Source: Ambulatory Visit | Attending: Physical Medicine & Rehabilitation | Admitting: Physical Medicine & Rehabilitation

## 2023-08-20 DIAGNOSIS — M4814 Ankylosing hyperostosis [Forestier], thoracic region: Secondary | ICD-10-CM | POA: Insufficient documentation

## 2023-08-20 DIAGNOSIS — M5414 Radiculopathy, thoracic region: Secondary | ICD-10-CM | POA: Insufficient documentation

## 2023-08-20 DIAGNOSIS — I700 Atherosclerosis of aorta: Secondary | ICD-10-CM | POA: Diagnosis not present

## 2023-08-20 DIAGNOSIS — G96198 Other disorders of meninges, not elsewhere classified: Secondary | ICD-10-CM | POA: Diagnosis not present

## 2023-08-20 MED ORDER — LIDOCAINE HCL (PF) 1 % IJ SOLN
10.0000 mL | Freq: Once | INTRAMUSCULAR | Status: AC
  Administered 2023-08-20: 10 mL
  Filled 2023-08-20: qty 10

## 2023-08-20 MED ORDER — IOHEXOL 300 MG/ML  SOLN
50.0000 mL | Freq: Once | INTRAMUSCULAR | Status: AC | PRN
Start: 2023-08-20 — End: 2023-08-20
  Administered 2023-08-20: 10 mL

## 2023-08-20 NOTE — Procedures (Signed)
Technically successful fluoro guided LP and injection of contrast for myelogram at L4-L5 level with return of clear CSF prior to injection of 10 mL of Omnipaque 300.   No immediate post procedural complication.  Please see imaging section of Epic for full dictation.    Mina Marble, PA-C 08/20/2023, 1:13 PM

## 2023-08-20 NOTE — Discharge Instructions (Signed)
Myelogram, Care After Refer to this sheet in the next few weeks. These instructions provide you with information on caring for yourself after your procedure. Your health care provider may also give you more specific instructions. Your treatment has been planned according to current medical practices, but problems sometimes occur. Call your health care provider if you have any problems or questions after your procedure. What can I expect after the procedure? After your procedure, it is typical to have the following sensations: Mild discomfort or pain at the insertion site. Mild headache that is relieved with pain medicines.  Follow these instructions at home:  Avoid lifting anything heavier than 10 lb (4.5 kg) for at least 12 hours after the procedure. Drink enough fluids to keep your urine clear or pale yellow. Remain at 30 degree head elevation for the remainder of the day.  Contact a health care provider if: You have fever or chills. You have nausea or vomiting. You have a headache that lasts for more than 2 days. Get help right away if: You have any numbness or tingling in your legs. You are unable to control your bowel or bladder. You have bleeding or swelling in your back at the insertion site. You are dizzy or faint. This information is not intended to replace advice given to you by your health care provider. Make sure you discuss any questions you have with your health care provider. Document Released: 10/18/2013 Document Revised: 03/20/2016 Document Reviewed: 06/21/2013 Elsevier Interactive Patient Education  2017 [employer].

## 2023-09-03 ENCOUNTER — Ambulatory Visit: Payer: Medicare HMO | Admitting: Neurosurgery

## 2023-09-21 ENCOUNTER — Other Ambulatory Visit: Payer: Self-pay | Admitting: Orthopedic Surgery

## 2023-09-22 NOTE — Progress Notes (Signed)
Referring Physician:  Elijah Birk, MD [address] [place],  [place] [address]  Primary Physician:  Mick Sell, MD  History of Present Illness: 09/29/2023 [name] is here today with a chief complaint of pain around his thoracic spine as well as pain into the left lateral hip area that extends down towards his knee.  He has been having pain for approximately 6 months.  He is limping due to this pain.  He has trouble with increasing pain with laying down, walking, and driving.  Sitting helps his pain.  He is scheduled for knee replacement due to pain around his left knee.  He underwent a left hip procedure earlier this year without significant improvement in his pain.  He has had injections without improvement.  He specifically denies sensory changes or weakness in his lower extremities.  He denies balance issues.  Bowel/Bladder Dysfunction: none  Conservative measures: saw a chiropractor  Physical therapy:  has participated in multiple times without relief Multimodal medical therapy including regular antiinflammatories:  lidocaine patch, hydrocodone, meloxicam Injections:  has received epidural steroid injections 07/31/23: Bilateral S1 TF ESI (50% relief)  Past Surgery: no previous spinal surgeries  [name] has no symptoms of cervical myelopathy.  The symptoms are causing a significant impact on the patient's life.   I have utilized the care everywhere function in epic to review the outside records available from external health systems.  Review of Systems:  A 10 point review of systems is negative, except for the pertinent positives and negatives detailed in the HPI.  Past Medical History: Past Medical History:  Diagnosis Date   Anemia    Anxiety    Aortic atherosclerosis (HCC)    Asthma    Cardiomegaly    Colon polyps    COPD (chronic obstructive pulmonary disease) (HCC)    Coronary artery disease 10/12/2015   a.) LHC  10/12/2015: 10% oLM, mino luminal irregs in LAD, LCx, RCA - med mgmt; b.) MPI 11/22/2019: no ischemia   Depression    a.) on multi-agent Tx using escitoalopram + lamotrigene + olanzapine   Diastolic dysfunction    a.) TTE 02/05/2022: EF 60-65%, no RWMAs, G2DD   Emphysema lung (HCC)    Enthesopathy of hip region on both sides    GERD (gastroesophageal reflux disease)    Hepatic steatosis    Hiatal hernia    Hyperlipidemia 2022   Hypertension    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 HTN, vascular disease history, T2DM);  b.) rate/rhythm maintained on oral atenolol; no chronic anticoagulation   PSVT (paroxysmal supraventricular tachycardia) (HCC)    Renal cyst    Sjogren's disease (HCC)    T2DM (type 2 diabetes mellitus) (HCC)    Trochanteric bursitis of left hip    Vertigo     Past Surgical History: Past Surgical History:  Procedure Laterality Date   COLONOSCOPY     COLONOSCOPY N/A 03/11/2023   Procedure: COLONOSCOPY;  Surgeon: Toledo, Boykin Nearing, MD;  Location: [facility] ENDOSCOPY;  Service: Gastroenterology;  Laterality: N/A;   ESOPHAGOGASTRODUODENOSCOPY     ESOPHAGOGASTRODUODENOSCOPY N/A 03/11/2023  Procedure: ESOPHAGOGASTRODUODENOSCOPY (EGD);  Surgeon: Toledo, Boykin Nearing, MD;  Location: [facility] ENDOSCOPY;  Service: Gastroenterology;  Laterality: N/A;   HERNIA REPAIR     KNEE ARTHROSCOPY     LEFT HEART CATH AND CORONARY ANGIOGRAPHY Left 10/12/2015   LOOP RECORDER INSERTION N/A 12/03/2016   TOTAL KNEE ARTHROPLASTY Right     Allergies: Allergies as of 09/29/2023 - Review Complete 09/29/2023  Allergen Reaction Noted   Bee pollen Other (See Comments) 01/05/2014   Dust  mite extract Cough 10/16/2020   Gramineae pollens Cough 10/16/2020    Medications:  Current Outpatient Medications:    albuterol (ACCUNEB) 1.25 MG/3ML nebulizer solution, Take 3 mLs (1.25 mg total) by nebulization every 4 (four) hours as needed for wheezing or shortness of breath., Disp: 75 mL, Rfl: 1   albuterol (VENTOLIN HFA) 108 (90 Base) MCG/ACT inhaler, Inhale 1-2 puffs into the lungs every 6 (six) hours as needed., Disp: , Rfl:    alendronate (FOSAMAX) 70 MG tablet, Take 70 mg by mouth once a week., Disp: , Rfl:    atenolol (TENORMIN) 25 MG tablet, Take 25 mg by mouth daily., Disp: , Rfl:    atorvastatin (LIPITOR) 40 MG tablet, Take 40 mg by mouth daily., Disp: , Rfl:    Budeson-Glycopyrrol-Formoterol (BREZTRI AEROSPHERE) 160-9-4.8 MCG/ACT AERO, Inhale into the lungs., Disp: , Rfl:    donepezil (ARICEPT) 5 MG tablet, Take 5 mg by mouth daily., Disp: , Rfl:    dupilumab (DUPIXENT) 300 MG/2ML prefilled syringe, Inject 300 mg into the skin once., Disp: , Rfl:    escitalopram (LEXAPRO) 20 MG tablet, Take 10.5 mg by mouth daily. Tapering off, Disp: , Rfl:    ezetimibe (ZETIA) 10 MG tablet, Take 10 mg by mouth daily., Disp: , Rfl:    lamoTRIgine (LAMICTAL) 200 MG tablet, Take 200 mg by mouth daily., Disp: , Rfl:    lidocaine (LIDODERM) 5 %, Place 1 patch onto the skin., Disp: , Rfl:    meclizine (ANTIVERT) 25 MG tablet, Take 25 mg by mouth 3 (three) times daily., Disp: , Rfl:    meloxicam (MOBIC) 15 MG tablet, Take 1 tablet (15 mg total) by mouth daily., Disp: 30 tablet, Rfl: 1   pantoprazole (PROTONIX) 20 MG tablet, Take 20 mg by mouth daily., Disp: , Rfl:    valACYclovir (VALTREX) 1000 MG tablet, Take 1,000 mg by mouth., Disp: , Rfl:    acetaminophen (TYLENOL) 500 MG tablet, Take 2 tablets (1,000 mg total) by mouth every 8 (eight) hours. (Patient not taking: Reported on 09/29/2023), Disp: 90 tablet, Rfl: 2  Social History: Social History   Tobacco Use   Smoking status: Former    Types:  Cigarettes   Smokeless tobacco: Never  Vaping Use   Vaping status: Never Used  Substance Use Topics   Alcohol use: Not Currently   Drug use: Not Currently    Family Medical History: History reviewed. No pertinent family history.  Physical Examination: Vitals:   09/29/23 0902  BP: 132/78    General: Patient is in no apparent distress. Attention to examination is appropriate.  Neck:   Supple.  Full range of motion.  Respiratory: Patient is breathing without any difficulty.   NEUROLOGICAL:     Awake, alert, oriented to person, place, and time.  Speech is clear and fluent.   Cranial Nerves: Pupils equal round and reactive to light.  Facial tone is symmetric.  Facial sensation is symmetric. Shoulder shrug is symmetric. Tongue protrusion is midline.  There is no pronator  drift.  Strength: Side Biceps Triceps Deltoid Interossei Grip Wrist Ext. Wrist Flex.  R 5 5 5 5 5 5 5   L 5 5 5 5 5 5 5    Side Iliopsoas Quads Hamstring PF DF EHL  R 5 5 5 5 5 5   L 5 5 5 5 5 5    Reflexes are 1+ and symmetric at the biceps, triceps, brachioradialis, patella and achilles.   Hoffman's is absent.   Bilateral upper and lower extremity sensation is intact to light touch.    No evidence of dysmetria noted.  Gait is abnormal - L knee pain.     Medical Decision Making  Imaging: MRI T spine 08/17/2023 IMPRESSION: 1. Effacement of the ventral CSF space from T6-7 through T8 with associated flattening of the spinal cord at the C6-7 level, suspicious for ventral cord herniation versus dorsal arachnoid cyst. Recommend further evaluation with CT myelography of the thoracic spine. 2. Small left central disc protrusion at T1-2 with associated mild flattening of the left ventral cord.     Electronically Signed   By: Orvan Falconer M.D.   On: 08/17/2023 19:55    CT Myelo 08/20/2023 IMPRESSION: 1. 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 Suggestion of possible loculation along the superior and left lateral aspect of the lesion. 2. Diffuse idiopathic skeletal hyperostosis.   Aortic Atherosclerosis (ICD10-I70.0).     Electronically Signed   By: Orvan Falconer M.D.   On: 08/20/2023 14:00  MRI L spine 07/16/2023 IMPRESSION: 1. L3-L4 mild right neural foraminal narrowing. Narrowing of the right lateral recess at this level could affect the descending right L4 nerve roots. 2. L4-L5 mild bilateral neural foraminal narrowing. The lateral aspects of the disc bulge may contact the exiting L4 nerve roots. Narrowing of the lateral recesses at this level could affect the descending L5 nerve roots. 3. L5-S1 disc osteophyte complex may contact the exiting L5 nerve roots. Narrowing of the left lateral recess at this level could affect the descending left S1 nerve roots. 4. Osseous fusion across the left greater than right sacroiliac joints.     Electronically Signed   By: Wiliam Ke M.D.   On: 07/16/2023 19:58  I have personally reviewed the images and agree with the above interpretation.  Assessment and Plan: Mr. Sharaf is a pleasant 67 y.o. 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  He does not have any current symptoms that are clearly referable to this.  I would like to see him back in approximately 2 months.  We reviewed warning signs that would suggest that this is a symptomatic lesion.  That would include lower extremity weakness, sensory changes in his lower extremities, or balance issues.  I think it is appropriate for him to move forward with his left knee replacement in 2 weeks.  From my standpoint, it would be okay for him to have spinal anesthesia.  His lumbar spine MRI shows possible left S1 nerve root compression, but is otherwise not significant.  I would like to reevaluate all  of this after he has his knee replaced.   Thank you for involving me in the care of this patient.      Tyan Lasure K. Myer Haff MD, [facility] Neurosurgery

## 2023-09-29 ENCOUNTER — Encounter: Payer: Self-pay | Admitting: Neurosurgery

## 2023-09-29 ENCOUNTER — Ambulatory Visit: Payer: Medicare HMO | Admitting: Neurosurgery

## 2023-09-29 VITALS — BP 132/78 | Ht 65.0 in | Wt 199.0 lb

## 2023-09-29 DIAGNOSIS — M5187 Other intervertebral disc disorders, lumbosacral region: Secondary | ICD-10-CM

## 2023-09-29 DIAGNOSIS — G96198 Other disorders of meninges, not elsewhere classified: Secondary | ICD-10-CM | POA: Diagnosis not present

## 2023-10-06 ENCOUNTER — Encounter
Admission: RE | Admit: 2023-10-06 | Discharge: 2023-10-06 | Disposition: A | Discharge status: Home / Self Care | Payer: Medicare HMO | Source: Ambulatory Visit | Attending: Orthopedic Surgery | Admitting: Orthopedic Surgery

## 2023-10-06 ENCOUNTER — Other Ambulatory Visit: Payer: Self-pay

## 2023-10-06 VITALS — BP 118/52 | HR 52 | Temp 98.8°F | Resp 18 | Wt 191.4 lb

## 2023-10-06 DIAGNOSIS — Z01812 Encounter for preprocedural laboratory examination: Secondary | ICD-10-CM | POA: Insufficient documentation

## 2023-10-06 DIAGNOSIS — Z79899 Other long term (current) drug therapy: Secondary | ICD-10-CM | POA: Diagnosis not present

## 2023-10-06 LAB — COMPREHENSIVE METABOLIC PANEL
ALT: 21 U/L (ref 0–44)
AST: 28 U/L (ref 15–41)
Albumin: 4.3 g/dL (ref 3.5–5.0)
Alkaline Phosphatase: 71 U/L (ref 38–126)
Anion gap: 7 (ref 5–15)
BUN: 12 mg/dL (ref 8–23)
CO2: 21 mmol/L — ABNORMAL LOW (ref 22–32)
Calcium: 8.7 mg/dL — ABNORMAL LOW (ref 8.9–10.3)
Chloride: 106 mmol/L (ref 98–111)
Creatinine, Ser: 1.18 mg/dL (ref 0.61–1.24)
GFR, Estimated: >60 mL/min (ref >60)
Glucose, Bld: 92 mg/dL (ref 70–99)
Potassium: 3.8 mmol/L (ref 3.5–5.1)
Sodium: 134 mmol/L — ABNORMAL LOW (ref 135–145)
Total Bilirubin: 1.1 mg/dL (ref <1.2)
Total Protein: 7.7 g/dL (ref 6.5–8.1)

## 2023-10-06 LAB — URINALYSIS, ROUTINE W REFLEX MICROSCOPIC
Bilirubin Urine: NEGATIVE
Glucose, UA: NEGATIVE mg/dL
Hgb urine dipstick: NEGATIVE
Ketones, ur: NEGATIVE mg/dL
Leukocytes,Ua: NEGATIVE
Nitrite: NEGATIVE
Protein, ur: 100 mg/dL — AB
Specific Gravity, Urine: 1.02 (ref 1.005–1.030)
Squamous Epithelial / HPF: 0 /[HPF] (ref 0–5)
pH: 6 (ref 5.0–8.0)

## 2023-10-06 LAB — CBC WITH DIFFERENTIAL/PLATELET
Abs Immature Granulocytes: 0.01 10*3/uL (ref 0.00–0.07)
Basophils Absolute: 0 10*3/uL (ref 0.0–0.1)
Basophils Relative: 1 %
Eosinophils Absolute: 0 10*3/uL (ref 0.0–0.5)
Eosinophils Relative: 0 %
HCT: 41.4 % (ref 39.0–52.0)
Hemoglobin: 14 g/dL (ref 13.0–17.0)
Immature Granulocytes: 0 %
Lymphocytes Relative: 26 %
Lymphs Abs: 1 10*3/uL (ref 0.7–4.0)
MCH: 33.1 pg (ref 26.0–34.0)
MCHC: 33.8 g/dL (ref 30.0–36.0)
MCV: 97.9 fL (ref 80.0–100.0)
Monocytes Absolute: 0.6 10*3/uL (ref 0.1–1.0)
Monocytes Relative: 16 %
Neutro Abs: 2.1 10*3/uL (ref 1.7–7.7)
Neutrophils Relative %: 57 %
Platelets: 299 10*3/uL (ref 150–400)
RBC: 4.23 MIL/uL (ref 4.22–5.81)
RDW: 13.2 % (ref 11.5–15.5)
WBC: 3.8 10*3/uL — ABNORMAL LOW (ref 4.0–10.5)
nRBC: 0 % (ref 0.0–0.2)

## 2023-10-06 LAB — SURGICAL PCR SCREEN
MRSA, PCR: NEGATIVE
Staphylococcus aureus: NEGATIVE

## 2023-10-06 NOTE — Patient Instructions (Addendum)
Your procedure is scheduled on: Thursday 10/15/23  Report to the Registration Desk on the 1st floor of the [facility]. To find out your arrival time, please call [phone] between 1PM - 3PM on: Wednesday 10/14/23  If your arrival time is 6:00 am, do not arrive before that time as the [facility] entrance doors do not open until 6:00 am.  REMEMBER: Instructions that are not followed completely may result in serious medical risk, up to and including death; or upon the discretion of your surgeon and anesthesiologist your surgery may need to be rescheduled.  Do not eat food after midnight the night before surgery.  No gum chewing or hard candies.  You may however, drink CLEAR liquids up to 2 hours before you are scheduled to arrive for your surgery. Do not drink anything within 2 hours of your scheduled arrival time.  Clear liquids include: - water  - apple juice without pulp - black coffee or tea (Do NOT add milk or creamers to the coffee or tea) Do NOT drink anything that is not on this list.   In addition, your doctor has ordered for you to drink the provided:  Ensure Pre-Surgery Clear Carbohydrate Drink  Drinking this carbohydrate drink up to two hours before surgery helps to reduce insulin resistance and improve patient outcomes. Please complete drinking 2 hours before scheduled arrival time.  One week prior to surgery: Stop Anti-inflammatories (NSAIDS) such as Advil, Aleve, Ibuprofen, Motrin, Naproxen, Naprosyn and Aspirin based products such as Excedrin, Goody's Powder, BC Powder. Stop ANY OVER THE COUNTER supplements until after surgery.  You may however, continue to take Tylenol if needed for pain up until the day of surgery.   Continue taking all of your other prescription medications up until the day of surgery.  ON THE DAY OF SURGERY ONLY TAKE THESE MEDICATIONS WITH SIPS OF WATER:  atenolol (TENORMIN) 25 MG  atorvastatin (LIPITOR) 40 MG  ezetimibe (ZETIA) 10 MG   lamoTRIgine (LAMICTAL) 200 MG  pantoprazole (PROTONIX) 40 MG  pilocarpine (SALAGEN) 5 MG   Use inhalers on the day of surgery and bring to the hospital. albuterol (VENTOLIN HFA) 108 (90 Base) MCG/ACT inhaler  Budeson-Glycopyrrol-Formoterol (BREZTRI AEROSPHERE) 160-9-4.8 MCG/ACT AERO   No Alcohol for 24 hours before or after surgery.  No Smoking including e-cigarettes for 24 hours before surgery.  No chewable tobacco products for at least 6 hours before surgery.  No nicotine patches on the day of surgery.  Do not use any "recreational" drugs for at least a week (preferably 2 weeks) before your surgery.  Please be advised that the combination of cocaine and anesthesia may have negative outcomes, up to and including death. If you test positive for cocaine, your surgery will be cancelled.  On the morning of surgery brush your teeth with toothpaste and water, you may rinse your mouth with mouthwash if you wish. Do not swallow any toothpaste or mouthwash.  Use CHG Soap or wipes as directed on instruction sheet.  Do not wear jewelry, make-up, hairpins, clips or nail polish.  For welded (permanent) jewelry: bracelets, anklets, waist bands, etc.  Please have this removed prior to surgery.  If it is not removed, there is a chance that hospital personnel will need to cut it off on the day of surgery.  Do not wear lotions, powders, or perfumes.   Do not shave body hair from the neck down 48 hours before surgery.  Contact lenses, hearing aids and dentures may not be worn  into surgery.  Do not bring valuables to the hospital. [facility] is not responsible for any missing/lost belongings or valuables.   Bring your C-PAP to the hospital in case you may have to spend the night.   Notify your doctor if there is any change in your medical condition (cold, fever, infection).  Wear comfortable clothing (specific to your surgery type) to the hospital.  After surgery, you can help prevent lung  complications by doing breathing exercises.  Take deep breaths and cough every 1-2 hours. Your doctor may order a device called an Incentive Spirometer to help you take deep breaths. When coughing or sneezing, hold a pillow firmly against your incision with both hands. This is called "splinting." Doing this helps protect your incision. It also decreases belly discomfort.  If you are being admitted to the hospital overnight, leave your suitcase in the car. After surgery it may be brought to your room.  In case of increased patient census, it may be necessary for you, the patient, to continue your postoperative care in the Same Day Surgery department.  If you are being discharged the day of surgery, you will not be allowed to drive home. You will need a responsible individual to drive you home and stay with you for 24 hours after surgery.   If you are taking public transportation, you will need to have a responsible individual with you.  Please call the Pre-admissions Testing Dept. at [phone] if you have any questions about these instructions.  Surgery Visitation Policy:  Patients having surgery or a procedure may have two visitors.  Children under the age of 54 must have an adult with them who is not the patient.  Inpatient Visitation:    Visiting hours are 7 a.m. to 8 p.m. Up to four visitors are allowed at one time in a patient room. The visitors may rotate out with other people during the day.  One visitor age 45 or older may stay with the patient overnight and must be in the room by 8 p.m.    Pre-operative 5 CHG Bath Instructions   You can play a key role in reducing the risk of infection after surgery. Your skin needs to be as free of germs as possible. You can reduce the number of germs on your skin by washing with CHG (chlorhexidine gluconate) soap before surgery. CHG is an antiseptic soap that kills germs and continues to kill germs even after washing.   DO NOT use if  you have an allergy to chlorhexidine/CHG or antibacterial soaps. If your skin becomes reddened or irritated, stop using the CHG and notify one of our RNs at [phone].   Please shower with the CHG soap starting 4 days before surgery using the following schedule:     Please keep in mind the following:  DO NOT shave, including legs and underarms, starting the day of your first shower.   You may shave your face at any point before/day of surgery.  Place clean sheets on your bed the day you start using CHG soap. Use a clean washcloth (not used since being washed) for each shower. DO NOT sleep with pets once you start using the CHG.   CHG Shower Instructions:  If you choose to wash your hair and private area, wash first with your normal shampoo/soap.  After you use shampoo/soap, rinse your hair and body thoroughly to remove shampoo/soap residue.  Turn the water OFF and apply about 3 tablespoons (45 ml) of CHG  soap to a CLEAN washcloth.  Apply CHG soap ONLY FROM YOUR NECK DOWN TO YOUR TOES (washing for 3-5 minutes)  DO NOT use CHG soap on face, private areas, open wounds, or sores.  Pay special attention to the area where your surgery is being performed.  If you are having back surgery, having someone wash your back for you may be helpful. Wait 2 minutes after CHG soap is applied, then you may rinse off the CHG soap.  Pat dry with a clean towel  Put on clean clothes/pajamas   If you choose to wear lotion, please use ONLY the CHG-compatible lotions on the back of this paper.     Additional instructions for the day of surgery: DO NOT APPLY any lotions, deodorants, cologne, or perfumes.   Put on clean/comfortable clothes.  Brush your teeth.  Ask your nurse before applying any prescription medications to the skin.      CHG Compatible Lotions   Aveeno Moisturizing lotion  Cetaphil Moisturizing Cream  Cetaphil Moisturizing Lotion  Clairol Herbal Essence Moisturizing Lotion, Dry Skin   Clairol Herbal Essence Moisturizing Lotion, Extra Dry Skin  Clairol Herbal Essence Moisturizing Lotion, Normal Skin  Curel Age Defying Therapeutic Moisturizing Lotion with Alpha Hydroxy  Curel Extreme Care Body Lotion  Curel Soothing Hands Moisturizing Hand Lotion  Curel Therapeutic Moisturizing Cream, Fragrance-Free  Curel Therapeutic Moisturizing Lotion, Fragrance-Free  Curel Therapeutic Moisturizing Lotion, Original Formula  Eucerin Daily Replenishing Lotion  Eucerin Dry Skin Therapy Plus Alpha Hydroxy Crme  Eucerin Dry Skin Therapy Plus Alpha Hydroxy Lotion  Eucerin Original Crme  Eucerin Original Lotion  Eucerin Plus Crme Eucerin Plus Lotion  Eucerin TriLipid Replenishing Lotion  Keri Anti-Bacterial Hand Lotion  Keri Deep Conditioning Original Lotion Dry Skin Formula Softly Scented  Keri Deep Conditioning Original Lotion, Fragrance Free Sensitive Skin Formula  Keri Lotion Fast Absorbing Fragrance Free Sensitive Skin Formula  Keri Lotion Fast Absorbing Softly Scented Dry Skin Formula  Keri Original Lotion  Keri Skin Renewal Lotion Keri Silky Smooth Lotion  Keri Silky Smooth Sensitive Skin Lotion  Nivea Body Creamy Conditioning Oil  Nivea Body Extra Enriched Lotion  Nivea Body Original Lotion  Nivea Body Sheer Moisturizing Lotion Nivea Crme  Nivea Skin Firming Lotion  NutraDerm 30 Skin Lotion  NutraDerm Skin Lotion  NutraDerm Therapeutic Skin Cream  NutraDerm Therapeutic Skin Lotion  ProShield Protective Hand Cream  Provon moisturizing lotion  How to Use an Incentive Spirometer  An incentive spirometer is a tool that measures how well you are filling your lungs with each breath. Learning to take long, deep breaths using this tool can help you keep your lungs clear and active. This may help to reverse or lessen your chance of developing breathing (pulmonary) problems, especially infection. You may be asked to use a spirometer: After a surgery. If you have a lung problem  or a history of smoking. After a long period of time when you have been unable to move or be active. If the spirometer includes an indicator to show the highest number that you have reached, your health care provider or respiratory therapist will help you set a goal. Keep a log of your progress as told by your health care provider. What are the risks? Breathing too quickly may cause dizziness or cause you to pass out. Take your time so you do not get dizzy or light-headed. If you are in pain, you may need to take pain medicine before doing incentive spirometry. It is harder to take  a deep breath if you are having pain. How to use your incentive spirometer  Sit up on the edge of your bed or on a chair. Hold the incentive spirometer so that it is in an upright position. Before you use the spirometer, breathe out normally. Place the mouthpiece in your mouth. Make sure your lips are closed tightly around it. Breathe in slowly and as deeply as you can through your mouth, causing the piston or the ball to rise toward the top of the chamber. Hold your breath for 3-5 seconds, or for as long as possible. If the spirometer includes a coach indicator, use this to guide you in breathing. Slow down your breathing if the indicator goes above the marked areas. Remove the mouthpiece from your mouth and breathe out normally. The piston or ball will return to the bottom of the chamber. Rest for a few seconds, then repeat the steps 10 or more times. Take your time and take a few normal breaths between deep breaths so that you do not get dizzy or light-headed. Do this every 1-2 hours when you are awake. If the spirometer includes a goal marker to show the highest number you have reached (best effort), use this as a goal to work toward during each repetition. After each set of 10 deep breaths, cough a few times. This will help to make sure that your lungs are clear. If you have an incision on your chest or abdomen  from surgery, place a pillow or a rolled-up towel firmly against the incision when you cough. This can help to reduce pain while taking deep breaths and coughing. General tips When you are able to get out of bed: Walk around often. Continue to take deep breaths and cough in order to clear your lungs. Keep using the incentive spirometer until your health care provider says it is okay to stop using it. If you have been in the hospital, you may be told to keep using the spirometer at home. Contact a health care provider if: You are having difficulty using the spirometer. You have trouble using the spirometer as often as instructed. Your pain medicine is not giving enough relief for you to use the spirometer as told. You have a fever. Get help right away if: You develop shortness of breath. You develop a cough with bloody mucus from the lungs. You have fluid or blood coming from an incision site after you cough. Summary An incentive spirometer is a tool that can help you learn to take long, deep breaths to keep your lungs clear and active. You may be asked to use a spirometer after a surgery, if you have a lung problem or a history of smoking, or if you have been inactive for a long period of time. Use your incentive spirometer as instructed every 1-2 hours while you are awake. If you have an incision on your chest or abdomen, place a pillow or a rolled-up towel firmly against your incision when you cough. This will help to reduce pain. Get help right away if you have shortness of breath, you cough up bloody mucus, or blood comes from your incision when you cough. This information is not intended to replace advice given to you by your health care provider. Make sure you discuss any questions you have with your health care provider. Document Revised: 01/02/2020 Document Reviewed: 01/02/2020 Elsevier Patient Education  2023 [employer].   Please go to the following website to access important  education materials  concerning your upcoming joint replacement.                                   [email or web address]

## 2023-10-06 NOTE — Pre-Procedure Instructions (Signed)
Patient came in for Preop, PAT was coughing up phlegms and said had chills the last couple of days. He had called pulmonology to see if they can see him again, said when he saw the doctor last week, he did not have this symptoms and he is getting worse. He has not heard back from them. Advised if he cannot get an appointment at pulmonology then to go to the Walk-in clinic at [facility]. Patient ok with information given. Notified Amador Cunas, [place] of above.

## 2023-10-08 ENCOUNTER — Telehealth: Payer: Self-pay | Admitting: Neurosurgery

## 2023-10-08 NOTE — Telephone Encounter (Signed)
Patient called just wondering if the pain in his hip was connected to what's going on with his spinal cord ? He saw Dr.Y a few weeks ago and couldn't remember what was discussed and is still having pain.

## 2023-10-08 NOTE — Telephone Encounter (Signed)
I spoke with the patient and notified him of Dr. Zoila Shutter response. He verbalized understanding.

## 2023-10-13 ENCOUNTER — Encounter: Payer: Self-pay | Admitting: Orthopedic Surgery

## 2023-10-13 NOTE — Progress Notes (Signed)
Perioperative / Anesthesia Services  Pre-Admission Testing Clinical Review / Preoperative Anesthesia Consult  Date: 10/13/23  Patient Demographics:  Name: Jesus Mckinney DOB:   1956/07/21 MRN:   [id number]  Planned Surgical Procedure(s):    Case: [id number] Date/Time: 10/15/23 1031   Procedure: TOTAL KNEE ARTHROPLASTY (Left: Knee)   Anesthesia type: Choice   Pre-op diagnosis:      Primary osteoarthritis of left knee      Left knee pain, unspecified chronicity   Location: [facility] OR ROOM 01 / [facility]   Surgeons: Reinaldo Berber, MD      NOTE: Available PAT nursing documentation and vital signs have been reviewed. Clinical nursing staff has updated patient's PMH/PSHx, current medication list, and drug allergies/intolerances to ensure comprehensive history available to assist in medical decision making as it pertains to the aforementioned surgical procedure and anticipated anesthetic course. Extensive review of available clinical information personally performed.  PMH and PSHx updated with any diagnoses/procedures that  may have been inadvertently omitted during his intake with the pre-admission testing department's nursing staff.  Clinical Discussion:  Jesus Mckinney is a 67 y.o. male who is submitted for pre-surgical anesthesia review and clearance prior to him undergoing the above procedure. Patient is a Former Games developer. 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  Patient is followed by cardiology [name], MD). He was last seen in the cardiology clinic on 10/02/2023; notes reviewed. At the time of his  clinic visit, patient doing well overall from a cardiovascular perspective.  Patient with complaints of chronic exertional dyspnea with short distance ambulation.  Symptoms reported to be stable and at baseline. Patient denied any chest pain,  PND, orthopnea, palpitations, significant peripheral edema, weakness, fatigue, vertiginous symptoms, or presyncope/syncope. Patient with a past medical history significant for cardiovascular diagnoses. Documented physical exam was grossly benign, providing no evidence of acute exacerbation and/or decompensation of the patient's known cardiovascular conditions.  Patient underwent diagnostic LEFT heart catheterization on 10/12/2015 revealing minimal nonobstructive coronary artery disease; 10% ostial LM and minor luminal irregularities within the LAD, LCx, and RCA  Given the nonobstructive nature of his known disease, no intervention was indicated.  Medical management was recommended.  Patient underwent implanted loop recorder device placement on 12/03/2016.  To date, patient with no significant arrhythmias or sustained pauses.  Myocardial perfusion imaging study was performed on 11/22/2019 revealing normal left ventricular systolic function with an EF of >55%.  There was no evidence of stress-induced myocardial ischemia or arrhythmia; no scintigraphic evidence of scar.  Study determined to be normal and low risk.  Most recent TTE was performed on 02/05/2022 revealing a normal left ventricular systolic function with an EF of 60 to 65%. There were no regional wall motion abnormalities. Left ventricular diastolic Doppler parameters consistent with pseudonormalization (G2DD). Right ventricular size and function normal.  There was no significant valvular regurgitation observed. All transvalvular gradients were noted to be normal providing no evidence suggestive of valvular stenosis.  Aorta noted to be normal in size with no evidence of aneurysmal dilatation.  Blood pressure  well controlled at 130/64 mmHg on currently prescribed beta-blocker (atenolol) monotherapy. Patient is on atorvastatin + ezetimibe for his HLD diagnosis and ASCVD prevention.  T2DM well  controlled with diet and lifestyle modifications alone; last HgbA1c was 5.9% when checked on 06/24/2023. He does have an OSAH diagnosis and is reported to be compliant with prescribed nocturnal PAP therapy. Functional capacity somewhat limited by activity. Again, patient experiences exertional dyspnea associated with short distance ambulation.  Patient reported that he has been working at [employer], doing landscaping, and working on his car without significant cardiovascular limitation.  With that being said, DASI defined functional capacity documented as being >4 METS of physical activity without experiencing significant angina/anginal equivalent symptoms.  No changes were made to his medication regimen.  Patient to follow-up with outpatient cardiology in 1 year or sooner if needed.  Jesus Mckinney is scheduled for an elective TOTAL KNEE ARTHROPLASTY (Left: Knee) on 10/15/2023 with Dr. Reinaldo Berber, MD. Given patient's past medical history significant for cardiovascular and cardiopulmonary diagnoses, presurgical clearances were sought from the patient's cardiology and pulmonary medicine providers.  Specialty clearances were obtained as follows.   Per pulmonology Karna Christmas, MD), "this surgery is not emergent. He is not anemic. ARISCAT (Canet) Preoperative pulmonary risk index in adults- low risk: 1.6% pulmonary postoperative complication rate. Arozullah respiratory failure index- low risk: 1.8% pulmonary postoperative complication rate. Chales Abrahams calculator for postoperative respiratory failure- low risk: 1.07% probability of postoperative respiratory failure".  Per cardiology Melton Alar, MD), "this patient is optimized for surgery and may proceed with the planned procedural course with a ACCEPTABLE risk of significant  perioperative cardiovascular complications".   In review of his medication reconciliation, it is noted that patient is currently on prescribed daily antithrombotic therapy.  Given his significant cardiovascular history, patient has been instructed on recommendations from his cardiologist for continuing his daily low-dose ASA throughout his perioperative course.  Patient denies previous perioperative complications with anesthesia in the past. In review of the available records, it is noted that patient underwent a general anesthetic course here at [facility] (ASA III) in 03/2023 without documented complications.      10/06/2023   11:34 AM 09/29/2023    9:02 AM 08/20/2023    2:28 PM  Vitals with BMI  Height  5\' 5"    Weight 191 lbs 6 oz 199 lbs   BMI 31.85 33.12   Systolic 118 132 161  Diastolic 52 78 66  Pulse 52  53    Providers/Specialists:   NOTE: Primary physician provider listed below. Patient may have been seen by APP or partner within same practice.   PROVIDER ROLE / SPECIALTY LAST Wilber Bihari, MD Orthopedics (Surgeon) 09/18/2023  Mick Sell, MD Primary Care Provider 06/30/2023  Clotilde Dieter, DO Cardiology 10/02/2023  Gerrie Nordmann, MD Rheumatology 03/25/2023  Filomena Jungling, MD Physiatry 08/20/2023  Vida Rigger, MD Pulmonary Medicine 10/06/2023   Allergies:  Bee pollen, Dust mite extract, and Gramineae pollens  Current Home Medications:   Current Outpatient Medications  Medication Instructions   albuterol (ACCUNEB) 1.25 mg, Nebulization, Every 4 hours PRN   albuterol (VENTOLIN HFA) 108 (90 Base) MCG/ACT inhaler 1-2 puffs, Inhalation, Every 6 hours PRN   alendronate (FOSAMAX) 70 mg, Oral, Every Wed   atenolol (TENORMIN) 25 mg, Oral, Daily   atorvastatin (LIPITOR) 40 mg, Oral, Daily   azithromycin (ZITHROMAX) 250 mg, Oral, Daily   Budeson-Glycopyrrol-Formoterol (BREZTRI AEROSPHERE) 160-9-4.8 MCG/ACT AERO 2  puffs, Inhalation, 2 times daily   Dentifrices (PEARL DROPS DT) dental   dupilumab (DUPIXENT) 300 mg, Subcutaneous, Every 14 days   escitalopram (LEXAPRO) 10 mg, Oral, Daily   ezetimibe (  ZETIA) 10 mg, Oral, Daily   fluticasone (FLONASE) 50 MCG/ACT nasal spray 2 sprays, Each Nare, Daily   lamoTRIgine (LAMICTAL) 200 mg, Oral, Daily   lidocaine (LIDODERM) 5 % 1 patch, Transdermal, Daily PRN   meloxicam (MOBIC) 15 mg, Oral, Daily   montelukast (SINGULAIR) 10 mg, Oral, Daily   pantoprazole (PROTONIX) 40 mg, Oral, 2 times daily   pilocarpine (SALAGEN) 5 mg, Oral, 3 times daily   predniSONE (DELTASONE) 30 mg, Oral, Daily with breakfast, Taking 6- 5 mg and tapering off    traMADol-acetaminophen (ULTRACET) 37.5-325 MG tablet 1 tablet, Oral, Every 6 hours PRN   History:   Past Medical History:  Diagnosis Date   Anemia    Anxiety    Aortic atherosclerosis (HCC)    Arachnoid cyst of mid-thoracic spine    a.) CT and MRI 07/2023: complicated dorsal arachnoid cyst extending from T5-T9 with anterior displacement/mild spinal cord compression; greatest mass effect at T6-T7.   Arthropathy of cervical facet joint    Asthma    Cardiomegaly    Colon polyps    COPD (chronic obstructive pulmonary disease) (HCC)    Coronary artery disease 10/12/2015   a.) LHC 10/12/2015: 10% oLM, min luminal irregs in LAD, LCx, RCA - med mgmt; b.) MPI 11/22/2019: no ischemia   Depression    a.) on multi-agent Tx using escitalopram + lamotrigene + olanzapine   Diastolic dysfunction    a.) TTE 02/05/2022: EF 60-65%, no RWMAs, G2DD   Emphysema lung (HCC)    Enthesopathy of hip region on both sides    GERD (gastroesophageal reflux disease)    Hepatic steatosis    Hiatal hernia    Hyperlipidemia 2022   Hypertension    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   Long term (current) use of immunosuppressive biologic    a.) dupilumab   Long-term corticosteroid use (prednisone)    Long-term use of aspirin therapy     Mild cognitive impairment    a.) on cholinesterase inhibitor (donepezil)   OSA on CPAP    Osteoarthritis    Osteoporosis    PAF (paroxysmal atrial fibrillation) (HCC)    a.) CHA2DS2VASc = 4 (age, HTN, vascular disease history, T2DM) as of 10/13/2023;  b.) rate/rhythm maintained on oral atenolol; no chronic anticoagulation   PSVT (paroxysmal supraventricular tachycardia) (HCC)    Renal cyst    Sjogren's disease (HCC)    T2DM (type 2 diabetes mellitus) (HCC)    Trochanteric bursitis of left hip    Vertigo    Past Surgical History:  Procedure Laterality Date   COLONOSCOPY     COLONOSCOPY N/A 03/11/2023   Procedure: COLONOSCOPY;  Surgeon: Toledo, Boykin Nearing, MD;  Location: [facility] ENDOSCOPY;  Service: Gastroenterology;  Laterality: N/A;   ESOPHAGOGASTRODUODENOSCOPY     ESOPHAGOGASTRODUODENOSCOPY N/A 03/11/2023   Procedure: ESOPHAGOGASTRODUODENOSCOPY (EGD);  Surgeon: Toledo, Boykin Nearing, MD;  Location: [facility] ENDOSCOPY;  Service: Gastroenterology;  Laterality: N/A;   HERNIA REPAIR     KNEE ARTHROSCOPY     LEFT HEART CATH AND CORONARY ANGIOGRAPHY Left 10/12/2015   LOOP RECORDER INSERTION N/A 12/03/2016   TOTAL KNEE ARTHROPLASTY Right    No family history on file. Social History   Tobacco Use   Smoking status: Former    Types: Cigarettes   Smokeless tobacco: Never  Vaping Use   Vaping status: Never Used  Substance Use Topics   Alcohol use: Not Currently   Drug use: Not Currently    Pertinent Clinical Results:  LABS:   No  visits with results within 3 Day(s) from this visit.  Latest known visit with results is:  Hospital Outpatient Visit on 10/06/2023  Component Date Value Ref Range Status   WBC 10/06/2023 3.8 (L)  4.0 - 10.5 K/uL Final   RBC 10/06/2023 4.23  4.22 - 5.81 MIL/uL Final   Hemoglobin 10/06/2023 14.0  13.0 - 17.0 g/dL Final   HCT 16/07/9603 41.4  39.0 - 52.0 % Final   MCV 10/06/2023 97.9  80.0 - 100.0 fL Final   MCH 10/06/2023 33.1  26.0 - 34.0 pg Final   MCHC  10/06/2023 33.8  30.0 - 36.0 g/dL Final   RDW 54/06/8118 13.2  11.5 - 15.5 % Final   Platelets 10/06/2023 299  150 - 400 K/uL Final   nRBC 10/06/2023 0.0  0.0 - 0.2 % Final   Neutrophils Relative % 10/06/2023 57  % Final   Neutro Abs 10/06/2023 2.1  1.7 - 7.7 K/uL Final   Lymphocytes Relative 10/06/2023 26  % Final   Lymphs Abs 10/06/2023 1.0  0.7 - 4.0 K/uL Final   Monocytes Relative 10/06/2023 16  % Final   Monocytes Absolute 10/06/2023 0.6  0.1 - 1.0 K/uL Final   Eosinophils Relative 10/06/2023 0  % Final   Eosinophils Absolute 10/06/2023 0.0  0.0 - 0.5 K/uL Final   Basophils Relative 10/06/2023 1  % Final   Basophils Absolute 10/06/2023 0.0  0.0 - 0.1 K/uL Final   Immature Granulocytes 10/06/2023 0  % Final   Abs Immature Granulocytes 10/06/2023 0.01  0.00 - 0.07 K/uL Final   Performed at [facility], [address] [place], [place] [address]   Sodium 10/06/2023 134 (L)  135 - 145 mmol/L Final   Potassium 10/06/2023 3.8  3.5 - 5.1 mmol/L Final   HEMOLYSIS AT THIS LEVEL MAY AFFECT RESULT   Chloride 10/06/2023 106  98 - 111 mmol/L Final   CO2 10/06/2023 21 (L)  22 - 32 mmol/L Final   Glucose, Bld 10/06/2023 92  70 - 99 mg/dL Final   Glucose reference range applies only to samples taken after fasting for at least 8 hours.   BUN 10/06/2023 12  8 - 23 mg/dL Final   Creatinine, Ser 10/06/2023 1.18  0.61 - 1.24 mg/dL Final   Calcium 95/62/1308 8.7 (L)  8.9 - 10.3 mg/dL Final   Total Protein 65/78/4696 7.7  6.5 - 8.1 g/dL Final   Albumin 29/52/8413 4.3  3.5 - 5.0 g/dL Final   AST 24/40/1027 28  15 - 41 U/L Final   HEMOLYSIS AT THIS LEVEL MAY AFFECT RESULT   ALT 10/06/2023 21  0 - 44 U/L Final   HEMOLYSIS AT THIS LEVEL MAY AFFECT RESULT   Alkaline Phosphatase 10/06/2023 71  38 - 126 U/L Final   Total Bilirubin 10/06/2023 1.1  <1.2 mg/dL Final   HEMOLYSIS AT THIS LEVEL MAY AFFECT RESULT   GFR, Estimated 10/06/2023 >60  >60 mL/min Final   Comment: (NOTE) Calculated using  the CKD-EPI Creatinine Equation (2021)    Anion gap 10/06/2023 7  5 - 15 Final   Performed at [facility], [address] [place], [place] [address]   Color, Urine 10/06/2023 YELLOW (A)  YELLOW Final   APPearance 10/06/2023 HAZY (A)  CLEAR Final   Specific Gravity, Urine 10/06/2023 1.020  1.005 - 1.030 Final   pH 10/06/2023 6.0  5.0 - 8.0 Final   Glucose, UA 10/06/2023 NEGATIVE  NEGATIVE mg/dL Final   Hgb urine dipstick  10/06/2023 NEGATIVE  NEGATIVE Final   Bilirubin Urine 10/06/2023 NEGATIVE  NEGATIVE Final   Ketones, ur 10/06/2023 NEGATIVE  NEGATIVE mg/dL Final   Protein, ur 86/57/8469 100 (A)  NEGATIVE mg/dL Final   Nitrite 62/95/2841 NEGATIVE  NEGATIVE Final   Leukocytes,Ua 10/06/2023 NEGATIVE  NEGATIVE Final   RBC / HPF 10/06/2023 0-5  0 - 5 RBC/hpf Final   WBC, UA 10/06/2023 0-5  0 - 5 WBC/hpf Final   Bacteria, UA 10/06/2023 RARE (A)  NONE SEEN Final   Squamous Epithelial / HPF 10/06/2023 0  0 - 5 /HPF Final   Mucus 10/06/2023 PRESENT   Final   Hyaline Casts, UA 10/06/2023 PRESENT   Final   Performed at [facility], [address] [place], [place] [address]   MRSA, PCR 10/06/2023 NEGATIVE  NEGATIVE Final   Staphylococcus aureus 10/06/2023 NEGATIVE  NEGATIVE Final   Comment: (NOTE) The Xpert SA Assay (FDA approved for NASAL specimens in patients 1 years of age and older), is one component of a comprehensive surveillance program. It is not intended to diagnose infection nor to guide or monitor treatment. Performed at [facility], [address] [place], [place] [address]     ECG: Date: 04/01/2023 Time ECG obtained: 1147 AM Rate: 53 bpm Rhythm: sinus bradycardia Axis (leads I and aVF): Normal Intervals: PR 178 ms. QRS 100 ms. QTc 422 ms. ST segment and T wave changes: No evidence of acute ST segment elevation or depression Comparison: Similar to previous tracing obtained on 02/15/2022   IMAGING / PROCEDURES: PULMONARY FUNCTION  TESTING performed on 09/28/2023 SPIROMETRY: FVC was 3.32 liters, 92% of predicted FEV1 was 2.66, 96% of predicted FEV1 ratio was 81.17 FEF 25-75% liters per second was 117% of predicted  LUNG VOLUMES: TLC was 80% of predicted RV was 71% of predicted  DIFFUSION CAPACITY: DLCO was 88% of predicted   CT THORACIC SPINE W CONTRAST performed on 08/20/2023 Complicated dorsal arachnoid cyst extending from approximately T5-T9 resulting in anterior displacement and mild compression of the thoracic spinal cord, with greatest mass effect at the T6-7 level. Suggestion of possible loculation along the superior and left lateral aspect of the lesion. Diffuse idiopathic skeletal hyperostosis. Aortic atherosclerosis  MR THORACIC SPINE WO CONTRAST performed on 08/17/2023 Effacement of the ventral CSF space from T6-7 through T8 with associated flattening of the spinal cord at the C6-7 level, suspicious for ventral cord herniation versus dorsal arachnoid cyst.  Recommend further evaluation with CT myelography of the thoracic spine. Small left central disc protrusion at T1-2 with associated mild flattening of the left ventral cord.  CT ABDOMEN PELVIS W CONTRAST  performed on 02/15/2022 No evidence of pulmonary arterial embolus. Cardiomegaly without evidence of CHF. Aortic and coronary artery atherosclerosis.  No AAA. New finding of nondisplaced right posterolateral seventh and eighth rib fractures and displaced right posterolateral ninth rib fracture, trace right pleural effusion or hemothorax without pneumothorax, and increased opacity in the right lower lobe which could be atelectasis, contusions or pneumonia. 1.3 x 0.7 cm nodular opacity in the posterior basal left lower lobe, seen on the most recent exam but not on last year's study. Agree with prior recommendation for three-month follow-up noncontrast CT to ensure resolution. Reference: Radiology 2017; [id number]. Hiatal hernia with mild thickening of  the distal thoracic esophagus. Correlate clinically for reflux esophagitis. There appear to be early cirrhotic changes in the liver with steatosis, mildly prominent hepatic portal vein. There is no splenomegaly or ascites.  Constipation, with terminal  ileal fecal back up. No bowel obstruction or inflammation. Uncomplicated diverticulosis. Osteopenia, degenerative and DISH changes. No concerning regional bone lesion. Stranding in the right lateral chest and abdominal wall but no space-occupying hematoma, no free hemorrhage or free air.  TRANSTHORACIC ECHOCARDIOGRAM performed on 02/05/2022 Left ventricular ejection fraction, by estimation, is 60 to 65%. The left ventricle has normal function. The left ventricle has no regional  wall motion abnormalities. Left ventricular diastolic parameters are  consistent with Grade II diastolic  dysfunction (pseudonormalization).  Right ventricular systolic function is normal. The right ventricular size is not well visualized.  The mitral valve is normal in structure. No evidence of mitral valve regurgitation.  The aortic valve is tricuspid. Aortic valve regurgitation is not visualized.   Impression and Plan:  Jesus Mckinney has been referred for pre-anesthesia review and clearance prior to him undergoing the planned anesthetic and procedural courses. Available labs, pertinent testing, and imaging results were personally reviewed by me in preparation for upcoming operative/procedural course. [facility] medical record has been updated following extensive record review and patient interview with PAT staff.   This patient has been appropriately cleared by cardiology (ACCEPTABLE) and by pulmonary medicine) ACCEPTABLE) with the individually indicated risk of significant perioperative cardiovascular/cardiopulmonary complications. Based on clinical review performed today (10/13/23), barring any significant acute changes in the patient's overall condition, it is  anticipated that he will be able to proceed with the planned surgical intervention. Any acute changes in clinical condition may necessitate his procedure being postponed and/or cancelled. Patient will meet with anesthesia team (MD and/or CRNA) on the day of his procedure for preoperative evaluation/assessment. Questions regarding anesthetic course will be fielded at that time.   Pre-surgical instructions were reviewed with the patient during his PAT appointment, and questions were fielded to satisfaction by PAT clinical staff. He has been instructed on which medications that he will need to hold prior to surgery, as well as the ones that have been deemed safe/appropriate to take on the day of his procedure. As part of the general education provided by PAT, patient made aware both verbally and in writing, that he would need to abstain from the use of any illegal substances during his perioperative course.  He was advised that failure to follow the provided instructions could necessitate case cancellation or result in serious perioperative complications up to and including death. Patient encouraged to contact PAT and/or his surgeon's office to discuss any questions or concerns that may arise prior to surgery; verbalized understanding.   Quentin Mulling, MSN, APRN, FNP-C, CEN [facility]  Peri-operative Services Nurse Practitioner Phone: [phone] Fax: [phone] 10/13/23 11:59 AM  NOTE: This note has been prepared using Dragon dictation software. Despite my best ability to proofread, there is always the potential that unintentional transcriptional errors may still occur from this process.

## 2023-10-14 MED ORDER — ORAL CARE MOUTH RINSE: 15.0000 mL | Freq: Once | OROMUCOSAL | Status: AC

## 2023-10-14 MED ORDER — CHLORHEXIDINE GLUCONATE 0.12 % MT SOLN
15.0000 mL | Freq: Once | OROMUCOSAL | Status: AC
  Administered 2023-10-15: 15 mL via OROMUCOSAL

## 2023-10-14 MED ORDER — CEFAZOLIN SODIUM-DEXTROSE 2-4 GM/100ML-% IV SOLN
2.0000 g | INTRAVENOUS | Status: AC
  Administered 2023-10-15: 2 g via INTRAVENOUS

## 2023-10-14 MED ORDER — LACTATED RINGERS IV SOLN: INTRAVENOUS | Status: DC

## 2023-10-14 MED ORDER — DEXAMETHASONE SODIUM PHOSPHATE 10 MG/ML IJ SOLN: 8.0000 mg | Freq: Once | INTRAMUSCULAR | Status: DC

## 2023-10-14 MED ORDER — TRANEXAMIC ACID-NACL 1000-0.7 MG/100ML-% IV SOLN
1000.0000 mg | INTRAVENOUS | Status: AC
  Administered 2023-10-15 (×2): 1000 mg via INTRAVENOUS

## 2023-10-15 ENCOUNTER — Other Ambulatory Visit: Payer: Self-pay

## 2023-10-15 ENCOUNTER — Observation Stay
Admission: RE | Admit: 2023-10-15 | Discharge: 2023-10-16 | Disposition: A | Discharge status: Home Health | Payer: Medicare HMO | Attending: Orthopedic Surgery | Admitting: Orthopedic Surgery

## 2023-10-15 ENCOUNTER — Encounter: Payer: Self-pay | Admitting: Orthopedic Surgery

## 2023-10-15 ENCOUNTER — Encounter
Admission: RE | Disposition: A | Discharge status: Home Health | Payer: Self-pay | Source: Home / Self Care | Attending: Orthopedic Surgery

## 2023-10-15 ENCOUNTER — Observation Stay: Payer: Medicare HMO

## 2023-10-15 ENCOUNTER — Ambulatory Visit: Payer: Self-pay | Admitting: Urgent Care

## 2023-10-15 ENCOUNTER — Ambulatory Visit: Payer: Medicare HMO | Admitting: Urgent Care

## 2023-10-15 DIAGNOSIS — E119 Type 2 diabetes mellitus without complications: Secondary | ICD-10-CM | POA: Insufficient documentation

## 2023-10-15 DIAGNOSIS — I480 Paroxysmal atrial fibrillation: Secondary | ICD-10-CM | POA: Insufficient documentation

## 2023-10-15 DIAGNOSIS — J449 Chronic obstructive pulmonary disease, unspecified: Secondary | ICD-10-CM | POA: Insufficient documentation

## 2023-10-15 DIAGNOSIS — M1712 Unilateral primary osteoarthritis, left knee: Principal | ICD-10-CM | POA: Insufficient documentation

## 2023-10-15 DIAGNOSIS — Z96651 Presence of right artificial knee joint: Secondary | ICD-10-CM | POA: Diagnosis not present

## 2023-10-15 DIAGNOSIS — Z7982 Long term (current) use of aspirin: Secondary | ICD-10-CM | POA: Insufficient documentation

## 2023-10-15 DIAGNOSIS — Z96652 Presence of left artificial knee joint: Principal | ICD-10-CM

## 2023-10-15 DIAGNOSIS — I2510 Atherosclerotic heart disease of native coronary artery without angina pectoris: Secondary | ICD-10-CM | POA: Diagnosis not present

## 2023-10-15 DIAGNOSIS — I10 Essential (primary) hypertension: Secondary | ICD-10-CM | POA: Diagnosis not present

## 2023-10-15 HISTORY — PX: TOTAL KNEE ARTHROPLASTY: SHX125

## 2023-10-15 HISTORY — DX: Spondylosis without myelopathy or radiculopathy, cervical region: M47.812

## 2023-10-15 HISTORY — DX: Other disorders of meninges, not elsewhere classified: G96.198

## 2023-10-15 HISTORY — DX: Long term (current) use of systemic steroids: Z79.52

## 2023-10-15 LAB — GLUCOSE, CAPILLARY
Glucose-Capillary: 115 mg/dL — ABNORMAL HIGH (ref 70–99)
Glucose-Capillary: 107 mg/dL — ABNORMAL HIGH (ref 70–99)

## 2023-10-15 SURGERY — ARTHROPLASTY, KNEE, TOTAL
Anesthesia: General | Site: Knee | Laterality: Left

## 2023-10-15 MED ORDER — HYDROMORPHONE HCL 1 MG/ML IJ SOLN
0.5000 mg | INTRAMUSCULAR | Status: AC | PRN
  Administered 2023-10-15 (×4): 0.5 mg via INTRAVENOUS

## 2023-10-15 MED ORDER — LACTATED RINGERS IV SOLN: INTRAVENOUS | Status: DC | PRN

## 2023-10-15 MED ORDER — ESCITALOPRAM OXALATE 10 MG PO TABS
10.0000 mg | ORAL_TABLET | Freq: Every day | ORAL | Status: DC
  Administered 2023-10-15: 10 mg via ORAL
  Filled 2023-10-15 (×3): qty 1

## 2023-10-15 MED ORDER — MIDAZOLAM HCL 2 MG/2ML IJ SOLN
INTRAMUSCULAR | Status: AC
  Filled 2023-10-15: qty 2

## 2023-10-15 MED ORDER — METOCLOPRAMIDE HCL 5 MG/ML IJ SOLN: 5.0000 mg | Freq: Three times a day (TID) | INTRAMUSCULAR | Status: DC | PRN

## 2023-10-15 MED ORDER — SODIUM CHLORIDE (PF) 0.9 % IJ SOLN
INTRAMUSCULAR | Status: DC | PRN
  Administered 2023-10-15: 71 mL via INTRAMUSCULAR

## 2023-10-15 MED ORDER — PHENOL 1.4 % MT LIQD: 1.0000 | OROMUCOSAL | Status: DC | PRN

## 2023-10-15 MED ORDER — OXYCODONE HCL 5 MG/5ML PO SOLN: 5.0000 mg | Freq: Once | ORAL | Status: AC | PRN

## 2023-10-15 MED ORDER — FENTANYL CITRATE (PF) 100 MCG/2ML IJ SOLN
25.0000 ug | INTRAMUSCULAR | Status: DC | PRN
  Administered 2023-10-15 (×2): 50 ug via INTRAVENOUS

## 2023-10-15 MED ORDER — OXYCODONE HCL 5 MG PO TABS
ORAL_TABLET | ORAL | Status: AC
  Filled 2023-10-15: qty 1

## 2023-10-15 MED ORDER — FENTANYL CITRATE (PF) 100 MCG/2ML IJ SOLN
INTRAMUSCULAR | Status: DC | PRN
  Administered 2023-10-15 (×4): 50 ug via INTRAVENOUS

## 2023-10-15 MED ORDER — EZETIMIBE 10 MG PO TABS
10.0000 mg | ORAL_TABLET | Freq: Every day | ORAL | Status: DC
Start: 2023-10-16 — End: 2023-10-16

## 2023-10-15 MED ORDER — FENTANYL CITRATE (PF) 100 MCG/2ML IJ SOLN
INTRAMUSCULAR | Status: AC
  Filled 2023-10-15: qty 2

## 2023-10-15 MED ORDER — TRANEXAMIC ACID-NACL 1000-0.7 MG/100ML-% IV SOLN
INTRAVENOUS | Status: AC
  Filled 2023-10-15: qty 100

## 2023-10-15 MED ORDER — SODIUM CHLORIDE 0.9 % IV SOLN
INTRAVENOUS | Status: DC
Start: 2023-10-15 — End: 2023-10-16

## 2023-10-15 MED ORDER — ATENOLOL 50 MG PO TABS
25.0000 mg | ORAL_TABLET | Freq: Every day | ORAL | Status: DC
Start: 2023-10-16 — End: 2023-10-16

## 2023-10-15 MED ORDER — ONDANSETRON HCL 4 MG/2ML IJ SOLN
INTRAMUSCULAR | Status: DC | PRN
  Administered 2023-10-15: 4 mg via INTRAVENOUS

## 2023-10-15 MED ORDER — ACETAMINOPHEN 325 MG PO TABS
325.0000 mg | ORAL_TABLET | Freq: Four times a day (QID) | ORAL | Status: DC | PRN
  Administered 2023-10-15: 325 mg via ORAL

## 2023-10-15 MED ORDER — DOCUSATE SODIUM 100 MG PO CAPS
100.0000 mg | ORAL_CAPSULE | Freq: Two times a day (BID) | ORAL | Status: DC
Start: 2023-10-15 — End: 2023-10-16
  Administered 2023-10-15 – 2023-10-16 (×2): 100 mg via ORAL

## 2023-10-15 MED ORDER — PROPOFOL 1000 MG/100ML IV EMUL
INTRAVENOUS | Status: AC
  Filled 2023-10-15: qty 100

## 2023-10-15 MED ORDER — SODIUM CHLORIDE 0.9 % IV SOLN: INTRAVENOUS | Status: DC

## 2023-10-15 MED ORDER — HYDROMORPHONE HCL 1 MG/ML IJ SOLN
INTRAMUSCULAR | Status: AC
  Filled 2023-10-15: qty 1

## 2023-10-15 MED ORDER — TRAMADOL HCL 50 MG PO TABS: 50.0000 mg | ORAL_TABLET | Freq: Four times a day (QID) | ORAL | Status: DC | PRN

## 2023-10-15 MED ORDER — ATORVASTATIN CALCIUM 20 MG PO TABS: 40.0000 mg | ORAL_TABLET | Freq: Every day | ORAL | Status: DC

## 2023-10-15 MED ORDER — HYDROCODONE-ACETAMINOPHEN 5-325 MG PO TABS
1.0000 | ORAL_TABLET | ORAL | Status: DC | PRN
Start: 2023-10-15 — End: 2023-10-16
  Administered 2023-10-15 – 2023-10-16 (×5): 2 via ORAL

## 2023-10-15 MED ORDER — HYDROCODONE-ACETAMINOPHEN 5-325 MG PO TABS
ORAL_TABLET | ORAL | Status: AC
  Filled 2023-10-15: qty 2

## 2023-10-15 MED ORDER — ROCURONIUM BROMIDE 100 MG/10ML IV SOLN
INTRAVENOUS | Status: DC | PRN
  Administered 2023-10-15: 50 mg via INTRAVENOUS

## 2023-10-15 MED ORDER — PROPOFOL 10 MG/ML IV BOLUS
INTRAVENOUS | Status: DC | PRN
  Administered 2023-10-15: 180 mg via INTRAVENOUS

## 2023-10-15 MED ORDER — PILOCARPINE HCL 5 MG PO TABS
5.0000 mg | ORAL_TABLET | Freq: Three times a day (TID) | ORAL | Status: DC
  Administered 2023-10-15: 5 mg via ORAL
  Filled 2023-10-15 (×6): qty 1

## 2023-10-15 MED ORDER — CEFAZOLIN SODIUM-DEXTROSE 2-4 GM/100ML-% IV SOLN
INTRAVENOUS | Status: AC
  Filled 2023-10-15: qty 100

## 2023-10-15 MED ORDER — SODIUM CHLORIDE 0.9 % IR SOLN
Status: DC | PRN
  Administered 2023-10-15: 3000 mL

## 2023-10-15 MED ORDER — BUPIVACAINE HCL (PF) 0.5 % IJ SOLN
INTRAMUSCULAR | Status: AC
  Filled 2023-10-15: qty 10

## 2023-10-15 MED ORDER — LIDOCAINE HCL (CARDIAC) PF 100 MG/5ML IV SOSY
PREFILLED_SYRINGE | INTRAVENOUS | Status: DC | PRN
  Administered 2023-10-15: 60 mg via INTRAVENOUS

## 2023-10-15 MED ORDER — METOCLOPRAMIDE HCL 5 MG PO TABS: 5.0000 mg | ORAL_TABLET | Freq: Three times a day (TID) | ORAL | Status: DC | PRN

## 2023-10-15 MED ORDER — CEFAZOLIN SODIUM-DEXTROSE 2-4 GM/100ML-% IV SOLN
2.0000 g | Freq: Four times a day (QID) | INTRAVENOUS | Status: AC
  Administered 2023-10-15 – 2023-10-16 (×2): 2 g via INTRAVENOUS
  Filled 2023-10-15: qty 100

## 2023-10-15 MED ORDER — KETOROLAC TROMETHAMINE 15 MG/ML IJ SOLN
7.5000 mg | Freq: Four times a day (QID) | INTRAMUSCULAR | Status: AC
  Administered 2023-10-15 – 2023-10-16 (×4): 7.5 mg via INTRAVENOUS

## 2023-10-15 MED ORDER — DEXAMETHASONE SODIUM PHOSPHATE 10 MG/ML IJ SOLN
INTRAMUSCULAR | Status: AC
  Filled 2023-10-15: qty 1

## 2023-10-15 MED ORDER — ONDANSETRON HCL 4 MG/2ML IJ SOLN
INTRAMUSCULAR | Status: AC
  Filled 2023-10-15: qty 2

## 2023-10-15 MED ORDER — ALBUTEROL SULFATE (2.5 MG/3ML) 0.083% IN NEBU: 2.5000 mg | INHALATION_SOLUTION | RESPIRATORY_TRACT | Status: DC | PRN

## 2023-10-15 MED ORDER — LAMOTRIGINE 100 MG PO TABS
200.0000 mg | ORAL_TABLET | Freq: Every day | ORAL | Status: DC
Start: 2023-10-16 — End: 2023-10-16
  Filled 2023-10-15: qty 2

## 2023-10-15 MED ORDER — DOCUSATE SODIUM 100 MG PO CAPS
ORAL_CAPSULE | ORAL | Status: AC
  Filled 2023-10-15: qty 1

## 2023-10-15 MED ORDER — SUGAMMADEX SODIUM 200 MG/2ML IV SOLN
INTRAVENOUS | Status: DC | PRN
  Administered 2023-10-15: 200 mg via INTRAVENOUS

## 2023-10-15 MED ORDER — ENOXAPARIN SODIUM 30 MG/0.3ML IJ SOSY
30.0000 mg | PREFILLED_SYRINGE | Freq: Two times a day (BID) | INTRAMUSCULAR | Status: DC
  Administered 2023-10-16: 30 mg via SUBCUTANEOUS

## 2023-10-15 MED ORDER — OXYCODONE HCL 5 MG PO TABS
5.0000 mg | ORAL_TABLET | Freq: Once | ORAL | Status: AC | PRN
Start: 2023-10-15 — End: 2023-10-15
  Administered 2023-10-15: 5 mg via ORAL

## 2023-10-15 MED ORDER — ONDANSETRON HCL 4 MG PO TABS: 4.0000 mg | ORAL_TABLET | Freq: Four times a day (QID) | ORAL | Status: DC | PRN

## 2023-10-15 MED ORDER — PANTOPRAZOLE SODIUM 40 MG PO TBEC
DELAYED_RELEASE_TABLET | ORAL | Status: AC
  Filled 2023-10-15: qty 1

## 2023-10-15 MED ORDER — PANTOPRAZOLE SODIUM 40 MG PO TBEC
40.0000 mg | DELAYED_RELEASE_TABLET | Freq: Every day | ORAL | Status: DC
  Administered 2023-10-16: 40 mg via ORAL

## 2023-10-15 MED ORDER — CHLORHEXIDINE GLUCONATE 0.12 % MT SOLN
OROMUCOSAL | Status: AC
  Filled 2023-10-15: qty 15

## 2023-10-15 MED ORDER — MIDAZOLAM HCL 2 MG/2ML IJ SOLN
INTRAMUSCULAR | Status: DC | PRN
  Administered 2023-10-15: 2 mg via INTRAVENOUS

## 2023-10-15 MED ORDER — PHENYLEPHRINE 80 MCG/ML (10ML) SYRINGE FOR IV PUSH (FOR BLOOD PRESSURE SUPPORT)
PREFILLED_SYRINGE | INTRAVENOUS | Status: AC
  Filled 2023-10-15: qty 10

## 2023-10-15 MED ORDER — ONDANSETRON HCL 4 MG/2ML IJ SOLN
4.0000 mg | Freq: Four times a day (QID) | INTRAMUSCULAR | Status: DC | PRN
Start: 2023-10-15 — End: 2023-10-16

## 2023-10-15 MED ORDER — SURGIPHOR WOUND IRRIGATION SYSTEM - OPTIME: TOPICAL | Status: DC | PRN

## 2023-10-15 MED ORDER — KETOROLAC TROMETHAMINE 15 MG/ML IJ SOLN
INTRAMUSCULAR | Status: AC
  Filled 2023-10-15: qty 1

## 2023-10-15 MED ORDER — DEXAMETHASONE SODIUM PHOSPHATE 10 MG/ML IJ SOLN
INTRAMUSCULAR | Status: DC | PRN
  Administered 2023-10-15: 8 mg via INTRAVENOUS

## 2023-10-15 MED ORDER — HYDROMORPHONE HCL 1 MG/ML IJ SOLN
INTRAMUSCULAR | Status: DC | PRN
  Administered 2023-10-15 (×2): .5 mg via INTRAVENOUS

## 2023-10-15 MED ORDER — BUDESON-GLYCOPYRROL-FORMOTEROL 160-9-4.8 MCG/ACT IN AERO: 2.0000 | INHALATION_SPRAY | Freq: Two times a day (BID) | RESPIRATORY_TRACT | Status: DC

## 2023-10-15 MED ORDER — PROPOFOL 10 MG/ML IV BOLUS
INTRAVENOUS | Status: AC
  Filled 2023-10-15: qty 20

## 2023-10-15 MED ORDER — EPHEDRINE 5 MG/ML INJ
INTRAVENOUS | Status: AC
  Filled 2023-10-15: qty 5

## 2023-10-15 MED ORDER — MORPHINE SULFATE (PF) 4 MG/ML IV SOLN
0.5000 mg | INTRAVENOUS | Status: DC | PRN
  Administered 2023-10-16: 1 mg via INTRAVENOUS

## 2023-10-15 MED ORDER — MONTELUKAST SODIUM 10 MG PO TABS
10.0000 mg | ORAL_TABLET | Freq: Every day | ORAL | Status: DC
  Administered 2023-10-15: 10 mg via ORAL
  Filled 2023-10-15 (×3): qty 1

## 2023-10-15 MED ORDER — MENTHOL 3 MG MT LOZG
1.0000 | LOZENGE | OROMUCOSAL | Status: DC | PRN
Start: 2023-10-15 — End: 2023-10-16

## 2023-10-15 MED ORDER — ACETAMINOPHEN 325 MG PO TABS
ORAL_TABLET | ORAL | Status: AC
Start: 2023-10-15 — End: ?
  Filled 2023-10-15: qty 1

## 2023-10-15 MED ORDER — AZITHROMYCIN 250 MG PO TABS
250.0000 mg | ORAL_TABLET | Freq: Every day | ORAL | Status: DC
  Administered 2023-10-15: 250 mg via ORAL
  Filled 2023-10-15 (×3): qty 1

## 2023-10-15 MED ORDER — PHENYLEPHRINE 80 MCG/ML (10ML) SYRINGE FOR IV PUSH (FOR BLOOD PRESSURE SUPPORT)
PREFILLED_SYRINGE | INTRAVENOUS | Status: DC | PRN
  Administered 2023-10-15: 160 ug via INTRAVENOUS

## 2023-10-15 MED ORDER — EPHEDRINE SULFATE-NACL 50-0.9 MG/10ML-% IV SOSY
PREFILLED_SYRINGE | INTRAVENOUS | Status: DC | PRN
  Administered 2023-10-15: 10 mg via INTRAVENOUS
  Administered 2023-10-15: 5 mg via INTRAVENOUS

## 2023-10-15 MED ORDER — ACETAMINOPHEN 500 MG PO TABS: 1000.0000 mg | ORAL_TABLET | Freq: Three times a day (TID) | ORAL | Status: DC

## 2023-10-15 SURGICAL SUPPLY — 69 items
BLADE CLIPPER SURG (BLADE) IMPLANT
BLADE PATELLA W-PILOT HOLE 35 (BLADE) IMPLANT
BLADE SAGITTAL AGGR TOOTH XLG (BLADE) IMPLANT
BLADE SAW SAG 25X90X1.19 (BLADE) ×1 IMPLANT
BLADE SAW SAG 29X58X.64 (BLADE) ×1 IMPLANT
BNDG ELASTIC 6INX 5YD STR LF (GAUZE/BANDAGES/DRESSINGS) ×1 IMPLANT
BOWL CEMENT MIX W/ADAPTER (MISCELLANEOUS) ×1 IMPLANT
CEMENT BONE R 1X40 (Cement) ×2 IMPLANT
CHLORAPREP W/TINT 26 (MISCELLANEOUS) ×2 IMPLANT
COOLER POLAR GLACIER W/PUMP (MISCELLANEOUS) ×1 IMPLANT
CUFF TRNQT CYL 24X4X16.5-23 (TOURNIQUET CUFF) IMPLANT
CUFF TRNQT CYL 30X4X21-28X (TOURNIQUET CUFF) IMPLANT
DERMABOND ADVANCED .7 DNX12 (GAUZE/BANDAGES/DRESSINGS) ×1 IMPLANT
DRAPE INCISE IOBAN 66X60 STRL (DRAPES) IMPLANT
DRAPE SHEET LG 3/4 BI-LAMINATE (DRAPES) ×1 IMPLANT
DRSG MEPILEX SACRM 8.7X9.8 (GAUZE/BANDAGES/DRESSINGS) ×1 IMPLANT
DRSG OPSITE POSTOP 4X10 (GAUZE/BANDAGES/DRESSINGS) IMPLANT
DRSG OPSITE POSTOP 4X8 (GAUZE/BANDAGES/DRESSINGS) IMPLANT
ELECT REM PT RETURN 9FT ADLT (ELECTROSURGICAL) ×1
ELECTRODE REM PT RTRN 9FT ADLT (ELECTROSURGICAL) ×1 IMPLANT
FEMORAL KNEE COMP SZ 8 STND LT (Knees) ×1 IMPLANT
FEMORAL KNEE COMP SZ 8STD LT (Knees) IMPLANT
GLOVE BIO SURGEON STRL SZ8 (GLOVE) ×1 IMPLANT
GLOVE BIOGEL PI IND STRL 8 (GLOVE) ×1 IMPLANT
GLOVE PI ORTHO PRO STRL 7.5 (GLOVE) ×2 IMPLANT
GLOVE PI ORTHO PRO STRL SZ8 (GLOVE) ×2 IMPLANT
GLOVE SURG SYN 7.5 E (GLOVE) ×1 IMPLANT
GLOVE SURG SYN 7.5 PF PI (GLOVE) ×1 IMPLANT
GOWN SRG XL LVL 3 NONREINFORCE (GOWNS) ×1 IMPLANT
GOWN STRL REUS W/ TWL LRG LVL3 (GOWN DISPOSABLE) ×1 IMPLANT
GOWN STRL REUS W/ TWL XL LVL3 (GOWN DISPOSABLE) ×1 IMPLANT
HOOD PEEL AWAY T7 (MISCELLANEOUS) ×2 IMPLANT
INSERT ARTISURF S8-11 18X22X14 (Insert) IMPLANT
IV NS IRRIG 3000ML ARTHROMATIC (IV SOLUTION) ×1 IMPLANT
KIT TURNOVER KIT A (KITS) ×1 IMPLANT
MANIFOLD NEPTUNE II (INSTRUMENTS) ×1 IMPLANT
MARKER SKIN DUAL TIP RULER LAB (MISCELLANEOUS) ×1 IMPLANT
MAT ABSORB FLUID 56X50 GRAY (MISCELLANEOUS) ×1 IMPLANT
NDL HYPO 21X1.5 SAFETY (NEEDLE) ×1 IMPLANT
NEEDLE HYPO 21X1.5 SAFETY (NEEDLE) ×1 IMPLANT
PACK TOTAL KNEE (MISCELLANEOUS) ×1 IMPLANT
PAD ARMBOARD 7.5X6 YLW CONV (MISCELLANEOUS) ×3 IMPLANT
PAD WRAPON POLAR KNEE (MISCELLANEOUS) ×1 IMPLANT
PENCIL SMOKE EVACUATOR (MISCELLANEOUS) ×1 IMPLANT
PIN DRILL HDLS TROCAR 75 4PK (PIN) IMPLANT
PULSAVAC PLUS IRRIG FAN TIP (DISPOSABLE) ×1
SCREW FEMALE HEX FIX 25X2.5 (ORTHOPEDIC DISPOSABLE SUPPLIES) IMPLANT
SCREW HEX HEADED 3.5X27 DISP (ORTHOPEDIC DISPOSABLE SUPPLIES) IMPLANT
SLEEVE SCD COMPRESS KNEE MED (STOCKING) ×1 IMPLANT
SOLUTION IRRIG SURGIPHOR (IV SOLUTION) ×1 IMPLANT
STEM POLY PAT PLY 35M KNEE (Knees) IMPLANT
STEM TIB ST PERS 14+30 (Stem) IMPLANT
STEM TIBIA 5 DEG SZ G L KNEE (Knees) IMPLANT
SUT STRATA 1 CT-1 DLB (SUTURE) ×1
SUT STRATAFIX 14 PDO 48 VLT (SUTURE) ×1 IMPLANT
SUT STRATAFIX PDO 1 14 VIOLET (SUTURE) ×1
SUT VIC AB 0 CT1 36 (SUTURE) ×1 IMPLANT
SUT VIC AB 2-0 CT2 27 (SUTURE) ×2 IMPLANT
SUT VICRYL 1-0 27IN ABS (SUTURE) ×1
SUTURE STRATA SPIR 4-0 18 (SUTURE) ×1 IMPLANT
SUTURE VICRYL 1-0 27IN ABS (SUTURE) ×1 IMPLANT
SYR 30ML LL (SYRINGE) ×2 IMPLANT
TAPE CLOTH 3X10 WHT NS LF (GAUZE/BANDAGES/DRESSINGS) ×1 IMPLANT
TIBIA STEM 5 DEG SZ G L KNEE (Knees) ×1 IMPLANT
TIP FAN IRRIG PULSAVAC PLUS (DISPOSABLE) ×1 IMPLANT
TOWEL OR 17X26 4PK STRL BLUE (TOWEL DISPOSABLE) IMPLANT
TRAP FLUID SMOKE EVACUATOR (MISCELLANEOUS) ×1 IMPLANT
WATER STERILE IRR 1000ML POUR (IV SOLUTION) ×1 IMPLANT
WRAPON POLAR PAD KNEE (MISCELLANEOUS) ×1

## 2023-10-15 NOTE — Transfer of Care (Signed)
Immediate Anesthesia Transfer of Care Note  Patient: Jesus Mckinney  Procedure(s) Performed: TOTAL KNEE ARTHROPLASTY (Left: Knee)  Patient Location: PACU  Anesthesia Type:General  Level of Consciousness: awake and alert   Airway & Oxygen Therapy: Patient Spontanous Breathing and Patient connected to nasal cannula oxygen  Post-op Assessment: Report given to RN and Post -op Vital signs reviewed and stable  Post vital signs: Reviewed and stable  Last Vitals:  Vitals Value Taken Time  BP 120/36 10/15/23 1208  Temp    Pulse 70 10/15/23 1212  Resp 9 10/15/23 1212  SpO2 90 % 10/15/23 1212  Vitals shown include unfiled device data.  Last Pain:  Vitals:   10/15/23 0855  TempSrc: Oral  PainSc: 0-No pain         Complications: No notable events documented.

## 2023-10-15 NOTE — Anesthesia Preprocedure Evaluation (Signed)
Anesthesia Evaluation  Patient identified by MRN, date of birth, ID band Patient awake    Reviewed: Allergy & Precautions, H&P , NPO status , Patient's Chart, lab work & pertinent test results  Airway Mallampati: III  TM Distance: >3 FB Neck ROM: full    Dental  (+) Dental Advidsory Given   Pulmonary asthma , sleep apnea , COPD,  COPD inhaler, former smoker severe persistent asthma with recurrent exacerbations requiring hospitalization recently treated with oral steroids.   Pulmonary exam normal        Cardiovascular Exercise Tolerance: Good hypertension, On Medications + CAD and +CHF  + dysrhythmias Atrial Fibrillation  Rhythm:Regular Rate:Normal - Peripheral Edema    Neuro/Psych  PSYCHIATRIC DISORDERS Anxiety Depression    negative neurological ROS     GI/Hepatic Neg liver ROS,GERD  Controlled and Medicated,,S/p distal esophagus dilation 03/11/2023   Endo/Other  negative endocrine ROSdiabetes    Renal/GU Renal disease     Musculoskeletal   Abdominal   Peds  Hematology negative hematology ROS (+)   Anesthesia Other Findings Past Medical History: No date: Anemia No date: Anxiety No date: Aortic atherosclerosis (HCC) No date: Arachnoid cyst of mid-thoracic spine     Comment:  a.) CT and MRI 07/2023: complicated dorsal arachnoid               cyst extending from T5-T9 with anterior displacement/mild              spinal cord compression; greatest mass effect at T6-T7. No date: Arthropathy of cervical facet joint No date: Asthma No date: Cardiomegaly No date: Colon polyps No date: COPD (chronic obstructive pulmonary disease) (HCC) 10/12/2015: Coronary artery disease     Comment:  a.) LHC 10/12/2015: 10% oLM, min luminal irregs in LAD,               LCx, RCA - med mgmt; b.) MPI 11/22/2019: no ischemia No date: Depression     Comment:  a.) on multi-agent Tx using escitalopram + lamotrigene +               olanzapine No date: Diastolic dysfunction     Comment:  a.) TTE 02/05/2022: EF 60-65%, no RWMAs, G2DD No date: Emphysema lung (HCC) No date: Enthesopathy of hip region on both sides No date: GERD (gastroesophageal reflux disease) Patient with a PMH of emphysema with a recent history of a cough. Patient states his cough has been more productive lately. Denies shortness of breath or chest pain. Patient states he wants "absolutely no spinal" and has "already talked to my doctor about the anesthesia". When I tried to talk about his desire to not have a spinal, patient was very short and repeated "I am not getting a spinal". Discussed the risks of breathing tubes including post op intubation, pneumothorax, and possible death and that the patient was high risk of complications with breathing tubes due to his emphysema. Patient stated he understood and agreed to proceed with general anesthesia with ETT.   Patient also stated he gets very nauseous with anesthesia and that he does not want any anti psychotic medications to treat his nausea. Again explained to the patient that a general anesthetic with an ETT would not be recommended due to the risk of nausea and vomiting compared to a spinal anesthetic. Patient again repeated "I am not getting a spinal no matter what". Discussed that we would try to treat his nausea as best we could according to his wishes. Patient stated he understood  and agreed to proceed.    No date: Hepatic steatosis No date: Hiatal hernia 2022: Hyperlipidemia No date: Hypertension 12/03/2016: Implantable loop recorder present No date: Long term (current) use of immunosuppressive biologic     Comment:  a.) dupilumab No date: Long-term corticosteroid use (prednisone) No date: Long-term use of aspirin therapy No date: Mild cognitive impairment     Comment:  a.) on cholinesterase inhibitor (donepezil) No date: OSA on CPAP No date: Osteoarthritis No date: Osteoporosis No date: PAF  (paroxysmal atrial fibrillation) (HCC)     Comment:  a.) CHA2DS2VASc = 4 (age, HTN, vascular disease history,              T2DM) as of 10/13/2023;  b.) rate/rhythm maintained on               oral atenolol; no chronic anticoagulation No date: PSVT (paroxysmal supraventricular tachycardia) (HCC) No date: Renal cyst No date: Sjogren's disease (HCC) No date: T2DM (type 2 diabetes mellitus) (HCC) No date: Trochanteric bursitis of left hip No date: Vertigo  Past Surgical History: No date: COLONOSCOPY 03/11/2023: COLONOSCOPY; N/A     Comment:  Procedure: COLONOSCOPY;  Surgeon: Toledo, Boykin Nearing, MD;              Location: [facility] END[facility];  Service: Gastroenterology;                Laterality: N/A; No date: ESOPHAGOGASTRODUODENOSCOPY 03/11/2023: ESOPHAGOGASTRODUODENOSCOPY; N/A     Comment:  Procedure: ESOPHAGOGASTRODUODENOSCOPY (EGD);  Surgeon:               Toledo, Boykin Nearing, MD;  Location: [facility] ENDOSCOPY;                Service: Gastroenterology;  Laterality: N/A; No date: HERNIA REPAIR No date: KNEE ARTHROSCOPY 10/12/2015: LEFT HEART CATH AND CORONARY ANGIOGRAPHY; Left 12/03/2016: LOOP RECORDER INSERTION; N/A No date: TOTAL KNEE ARTHROPLASTY; Right  BMI    Body Mass Index: 31.85 kg/m      Reproductive/Obstetrics negative OB ROS                             Anesthesia Physical Anesthesia Plan  ASA: 3  Anesthesia Plan: General ETT and General   Post-op Pain Management:    Induction: Intravenous  PONV Risk Score and Plan: 2 and Ondansetron, Dexamethasone and Midazolam  Airway Management Planned: Oral ETT  Additional Equipment:   Intra-op Plan:   Post-operative Plan: Extubation in OR  Informed Consent: I have reviewed the patients History and Physical, chart, labs and discussed the procedure including the risks, benefits and alternatives for the proposed anesthesia with the patient or authorized representative who has indicated his/her  understanding and acceptance.     Dental Advisory Given  Plan Discussed with: Anesthesiologist, CRNA and Surgeon  Anesthesia Plan Comments: (Patient consented for risks of anesthesia including but not limited to:  - adverse reactions to medications - damage to eyes, teeth, lips or other oral mucosa - nerve damage due to positioning  - sore throat or hoarseness - Damage to heart, brain, nerves, lungs, other parts of body or loss of life  Patient voiced understanding and assent.)       Anesthesia Quick Evaluation

## 2023-10-15 NOTE — H&P (Signed)
History of Present Illness: The patient is an 67 y.o. male seen in clinic today for evaluation of his left knee. The patient reports a history of left knee pain which significantly worsened about 3 weeks ago while traveling in the [place] Patient has a history of known arthritis in his left knee and had a history of severe arthritis in his right knee and underwent right knee replacement in 2018. Reports he did well with his right knee replacement. Comes in today with severe pain over the medial aspect of his left knee described as a sharp stabbing throbbing pain with catching and locking when trying to walk which is limiting his ability to walk ambulate and participate in activities on a daily basis. He tried topical creams and over-the-counter medications without improvement of his pain and he is also been given tramadol with minimal improvement. He reports this feels very similar to the pain he had in his right knee prior to his right knee replacement. He reports the pain is getting up to a 10 out of 10 at its worst and is keeping him from walking. He had previously undergone steroid injections and other treatments in his right knee before his right knee replacement and is not interested in conservative treatments on his left knee at this time. He comes in today to discuss a more permanent solution for his left knee pain and possible surgical intervention. The patient denies fevers, chills, numbness, tingling, shortness of breath, chest pain, recent illness, or any trauma.  Of note the patient is currently undergoing workup for a thoracic arachnoid cyst and is about to see neurosurgery. And is about to be seen by pulmonology for an incidentally found linear atelectasis versus scarring in his right lung base.  The patient is a non-smoker and nontobacco user, he is a nondiabetic with last A1c of 5.9, and has a BMI of 32.9.  Past Medical History: Past Medical History:  Diagnosis Date  Allergy Many  years ago  Anemia  Anxiety 35 years ago  Arrhythmia Two years ago  Arthritis  Asthma, unspecified asthma severity, unspecified whether complicated, unspecified whether persistent (HHS-HCC)  Atrial fibrillation (CMS/HHS-HCC)  Bronchitis, chronic (CMS/HHS-HCC)  COPD (chronic obstructive pulmonary disease) (CMS/HHS-HCC) 25 yrs ago  Coronary artery disease involving native coronary artery of native heart 04/17/2022  Depression 35 years ago  Diabetes mellitus without complication (CMS/HHS-HCC) Aoc 62  Essential hypertension 02/16/2022  GERD (gastroesophageal reflux disease)  History of pneumonia 02/02/2022  Hyperlipidemia Two years ago  Obesity  Osteoporosis I need to get tested  Sinusitis, unspecified  Sleep apnea   Past Surgical History: Past Surgical History:  Procedure Laterality Date  OTHER SURGERY 07/2022  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  Colon @ [facility] 03/11/2023  Normal exxamined colon/Poor colon prep/PHx CP/Repeat 49yrs/TKT  EGD @ [facility] 03/11/2023  Gastritis/Normal KOH prep/No repeat/TKT  CARDIAC CATHETERIZATION  FRACTURE SURGERY  HERNIA REPAIR  JOINT REPLACEMENT 5 yrs ago  Right knee  KNEE ARTHROSCOPY   Past Family History: Family History  Problem Relation Age of Onset  Coronary Artery Disease (Blocked arteries around heart) Mother  Diabetes type II Mother  High blood pressure (Hypertension) Mother   Medications: Current Outpatient Medications  Medication Sig Dispense Refill  albuterol (ACCUNEB) 1.25 mg/3 mL nebulizer solution Take 3 mLs (1.25 mg total) by nebulization every 6 (six) hours as needed 75 mL 0  albuterol MDI, PROVENTIL, VENTOLIN, PROAIR, HFA 90 mcg/actuation inhaler Inhale 2 inhalations into the lungs every 6 (six) hours as needed 1  each 2  alendronate (FOSAMAX) 70 MG tablet TAKE 1 TABLET BY MOUTH EVERY 7 DAYS WITH A FULL GLASS OF WATER. DO NOT LIE DOWN FOR THE NEXT 30 MINUTES 12 tablet 3  aspirin 81 MG EC tablet Take 81 mg by mouth once daily   atenoloL (TENORMIN) 25 MG tablet Take 0.5 tablets (12.5 mg total) by mouth 2 (two) times daily 90 tablet 3  atorvastatin (LIPITOR) 40 MG tablet Take 40 mg by mouth once daily  blood glucose diagnostic test strip 1 each (1 strip total) once daily Use as instructed. 100 each 2  blood glucose meter kit as directed 1 each 0  budesonide-glycopyrrolate-formoterol (BREZTRI AEROSPHERE) 160-9-4.8 mcg/actuation inhaler Inhale 2 inhalations into the lungs 2 (two) times daily 32.1 g 3  dupilumab (DUPIXENT) 300 mg/2 mL pen injector Inject 2 mLs (300 mg total) subcutaneously every 14 (fourteen) days 4 mL 11  escitalopram oxalate (LEXAPRO) 10 MG tablet Take 10 mg by mouth once daily  ezetimibe (ZETIA) 10 mg tablet Take 1 tablet (10 mg total) by mouth once daily 90 tablet 3  lamoTRIgine (LAMICTAL) 200 MG tablet Take 200 mg by mouth once daily  lancets Use 1 each once daily Use as instructed. 100 each 2  lidocaine (LIDODERM) 5 % patch Place 1 patch onto the skin daily Apply patch to the most painful area for up to 12 hours in a 24 hour period.  lidocaine (LIDODERM) 5 % patch Place 1 patch onto the skin daily Apply patch to the most painful area for up to 12 hours in a 24 hour period. 30 patch 3  meloxicam (MOBIC) 15 MG tablet TAKE 1 TABLET BY MOUTH ONCE DAILY 90 tablet 3  montelukast (SINGULAIR) 10 mg tablet TAKE 1 TABLET BY MOUTH AT BEDTIME 90 tablet 3  oxyCODONE (ROXICODONE) 5 MG immediate release tablet Take by mouth  pantoprazole (PROTONIX) 40 MG DR tablet TAKE 1 TABLET BY MOUTH TWICE DAILY 180 tablet 0  pilocarpine (SALAGEN) 5 mg tablet Take 1 tablet (5 mg total) by mouth 3 (three) times daily 270 tablet 3  predniSONE (DELTASONE) 20 MG tablet Take 1 tablet (20 mg total) by mouth once daily 5 tablet 0   No current facility-administered medications for this visit.   Allergies: Allergies  Allergen Reactions  Animal Dander Other (See Comments)  Watery itchy eyes, sneezing.  Grass Pollen-June Grass  Standard Other (See Comments)  sneezing  Grass Pollen-Red Top, Standard Cough and Other (See Comments)  Other reaction(s): Cough (ALLERGY/intolerance) sneezing  House Dust Cough and Other (See Comments)  Other reaction(s): Cough (ALLERGY/intolerance)  Mite Extract Other (See Comments)  Other reaction(s): Cough (ALLERGY/intolerance)  Pollen Extracts Other (See Comments)    Visit Vitals: Vitals:  09/18/23 1533  BP: 118/72    Review of Systems:  A comprehensive 14 point ROS was performed, reviewed, and the pertinent orthopaedic findings are documented in the HPI.  Physical Exam: General/Constitutional: No apparent distress: well-nourished and well developed. Eyes: Pupils equal, round with synchronous movement. Pulmonary exam: Lungs clear to auscultation bilaterally no wheezing rales or rhonchi Cardiac exam: Regular rate and rhythm no obvious murmurs rubs or gallops. Integumentary: No impressive skin lesions present, except as noted in detailed exam. Neuro/Psych: Normal mood and affect, oriented to person, place and time.  Comprehensive Knee Exam: Gait Non-antalgic and fluid  Alignment Neutral   Inspection Right Left  Skin Normal appearance with no obvious deformity. Healed midline incision no ecchymosis or erythema. Normal appearance with no obvious deformity. No ecchymosis  or erythema.  Soft Tissue No focal soft tissue swelling No focal soft tissue swelling  Quad Atrophy None None   Palpation  Right Left  Tenderness No peripatellar, patellar tendon, quad tendon, medial/lateral joint line pain Medial joint line tenderness to palpation  Crepitus No patellofemoral or tibiofemoral crepitus + patellofemoral and tibiofemoral crepitus  Effusion None None   Range of Motion Right Left  Flexion 0-120 0-120  Extension Full knee extension without hyperextension Full knee extension without hyperextension   Ligamentous Exam Right Left  Lachman Normal Normal  Valgus 0 Normal  Normal  Valgus 30 Normal Normal  Varus 0 Normal Normal  Varus 30 Normal Normal  Anterior Drawer Normal Normal  Posterior Drawer Normal Normal   Meniscal Exam Right Left  Hyperflexion Test N/A Positive  Hyperextension Test N/A Positive  McMurray's N/A Positive   Neurovascular Right Left  Quadriceps Strength 5/5 5/5  Hamstring Strength 5/5 5/5  Hip Abductor Strength 4/5 4/5  Distal Motor Normal Normal  Distal Sensory Normal light touch sensation Normal light touch sensation  Distal Pulses Normal Normal    Imaging Studies: I have reviewed AP, lateral,sunrise, and flexed PA weight bearing knee X-rays (4 views) of the left knee ordered and taken today in the office show severe degenerative changes with medial and patellofemoral joint space narrowing with medial bone-on-bone articulation, osteophyte formation, subchondral cysts and sclerosis. Kellgren-Lawrence grade 4. AP, sunrise, and flexed PA of the right knee also show status post total knee arthroplasty with components in well-fixed position with some mild under hanging of the tibia both medially and laterally and some calcification within the lateral gutter. No evidence of periprosthetic fracture or obvious signs of loosening.   Assessment:  ICD-10-CM  1. Left knee pain, unspecified chronicity M25.562  Left knee osteoarthritis  Plan: Misha is a 67 year old male presents with left knee bone-on-bone arthritis. We discussed treatment options including continuing with conservative treatments with medications and the possibility of injections. Patient would like to avoid injections as he did not feel that they helped his right knee in the past and would like to move forward with a more permanent solution for his left knee pain and disability. Based upon the patient's symptoms and significant physical exam and radiographic findings, I have recommended a left total knee replacement for this patient. A long discussion took place with the  patient describing what a total joint replacement is and what the procedure would entail. A knee model, similar to the implants that will be used during the operation, was utilized to demonstrate the implants. Choices of implant manufactures were discussed and reviewed. The ability to secure the implant utilizing cement or cementless (press fit) fixation was discussed. The approach and exposure was discussed.   The hospitalization and post-operative care and rehabilitation were also discussed. The use of perioperative antibiotics and DVT prophylaxis were discussed. The risk, benefits and alternatives to a surgical intervention were discussed at length with the patient. The patient was also advised of risks related to the medical comorbidities and elevated body mass index (BMI). A lengthy discussion took place to review the most common complications including but not limited to: stiffness, loss of function, complex regional pain syndrome, deep vein thrombosis, pulmonary embolus, heart attack, stroke, infection, wound breakdown, numbness, intraoperative fracture, damage to nerves, tendon,muscles, arteries or other blood vessels, death and other possible complications from anesthesia. The patient was told that we will take steps to minimize these risks by using sterile technique, antibiotics and DVT prophylaxis when  appropriate and follow the patient postoperatively in the office setting to monitor progress. The possibility of recurrent pain, no improvement in pain and actual worsening of pain were also discussed with the patient.   Patient asked about and confirms no history of any reactions to metal or metal allergy in the past.  The discharge plan of care focused on the patient going home following surgery. The patient was encouraged to make the necessary arrangements to have someone stay with them when they are discharged home.   The benefits of surgery were discussed with the patient including the  potential for improving the patient's current clinical condition through operative intervention. Alternatives to surgical intervention including continued conservative management were also discussed in detail. All questions were answered to the satisfaction of the patient. The patient participated and agreed to the plan of care as well as the use of the recommended implants for their total knee replacement surgery. An information packet was given to the patient to review prior to surgery.   The patient received medical and pulmonary clearance for surgery.  All questions answered patient agrees with plan preparations for a left total knee replacement.  Portions of this record have been created using Scientist, clinical (histocompatibility and immunogenetics). Dictation errors have been sought, but may not have been identified and corrected.  Reinaldo Berber MD

## 2023-10-15 NOTE — Evaluation (Signed)
Physical Therapy Evaluation Patient Details Name: Jesus Mckinney MRN: [id number] DOB: 1956-08-23 Today's Date: 10/15/2023  History of Present Illness  Pt is a 67 y.o. male s/p L TKA secondary to OA 10/15/23.  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  Clinical Impression  Prior to surgery, pt was modified independent ambulating with SPC (utilized SPC d/t L knee issues; was not using any AD prior to that); lives with his wife and dog in 1 level home with 1/2 step to enter.  4-5/10 L knee pain during session and pt also reporting feeling of "knot" in back of surgical knee (nurse updated).  Tolerated LE ex's fairly well with assist for L LE as needed.  Pt A&Ox4 but reports having difficulty with his memory (pt appearing forgetful at times during session)--nurse notified.  Currently pt is SBA semi-supine to sitting EOB; CGA with transfers using RW; and CGA to ambulate short distance to/from bathroom with RW use.  Of note, pt reporting having severe L hip pain at times (prior to surgery)--none noted during session.  Pt would currently benefit from skilled PT to address noted impairments and functional limitations (see below for any additional details).  Upon hospital discharge, pt would benefit from ongoing therapy.     If plan is discharge home, recommend the following: A little help with walking and/or transfers;A little help with bathing/dressing/bathroom;Assistance with cooking/housework;Assist for transportation;Help with stairs or ramp for entrance   Can travel by private vehicle    Yes    Equipment Recommendations Rolling walker (2 wheels);BSC/3in1  Recommendations for Other Services       Functional Status Assessment Patient has had a recent decline in their functional  status and demonstrates the ability to make significant improvements in function in a reasonable and predictable amount of time.     Precautions / Restrictions Precautions Precautions: Fall;Knee Precaution Booklet Issued: Yes (comment) Restrictions Weight Bearing Restrictions Per Provider Order: Yes LLE Weight Bearing Per Provider Order: Weight bearing as tolerated      Mobility  Bed Mobility Overal bed mobility: Needs Assistance Bed Mobility: Supine to Sit     Supine to sit: Supervision, HOB elevated     General bed mobility comments: mild increased effort to perform on own    Transfers Overall transfer level: Needs assistance Equipment used: Rolling walker (2 wheels) Transfers: Sit to/from Stand Sit to Stand: Contact guard assist           General transfer comment: vc's for UE/LE placement; fairly strong stand up to RW    Ambulation/Gait Ambulation/Gait assistance: Contact guard assist Gait Distance (Feet):  (10 feet x2 (to/from bathroom)) Assistive device: Rolling walker (2 wheels) Gait Pattern/deviations: Step-to pattern, Decreased step length - right, Decreased step length - left, Antalgic Gait velocity: decreased     General Gait Details: decreased stance time L LE; initial vc's for walker use  Stairs            Wheelchair Mobility     Tilt Bed    Modified Rankin (Stroke Patients Only)       Balance Overall balance assessment: Needs assistance Sitting-balance support: No upper extremity supported, Feet supported Sitting balance-Leahy Scale: Good Sitting balance - Comments: steady reaching within BOS   Standing balance support: No upper extremity supported Standing balance-Leahy Scale: Good Standing balance comment: steady standing at toilet to  void                             Pertinent Vitals/Pain Pain Assessment Pain Assessment: 0-10 Pain Score: 4  Pain Location: L knee Pain Descriptors / Indicators: Discomfort Pain  Intervention(s): Limited activity within patient's tolerance, Monitored during session, Premedicated before session, Repositioned, Other (comment) (polar care applied; RN notified) Vitals (HR and SpO2 on room air) stable and WFL throughout treatment session.    Home Living Family/patient expects to be discharged to:: Private residence Living Arrangements: Spouse/significant other Available Help at Discharge: Family Type of Home: House Home Access: Stairs to enter Entrance Stairs-Rails: None Entrance Stairs-Number of Steps: 1/2 step to enter   Home Layout: One level Home Equipment: Agricultural consultant (2 wheels);Shower seat - built in;Cane - single point      Prior Function Prior Level of Function : Independent/Modified Independent             Mobility Comments: Pt reports most recently ambulating with SPC d/t L knee issues (no AD prior to that).  No recent falls reported. ADLs Comments: Independent.     Extremity/Trunk Assessment   Upper Extremity Assessment Upper Extremity Assessment: Overall WFL for tasks assessed    Lower Extremity Assessment Lower Extremity Assessment: LLE deficits/detail (R LE WFL) LLE Deficits / Details: good L isometric quad set strength; at least 3/5 AROM hip flexion, knee extension/flexion, and DF; minimal assist for L LE SLR LLE: Unable to fully assess due to pain    Cervical / Trunk Assessment Cervical / Trunk Assessment: Normal  Communication   Communication Communication: No apparent difficulties Cueing Techniques: Verbal cues  Cognition Arousal: Alert Behavior During Therapy: WFL for tasks assessed/performed Overall Cognitive Status: Within Functional Limits for tasks assessed                                 General Comments: Pt reports having difficulty with his memory (pt appearing forgetful at times during session).        General Comments General comments (skin integrity, edema, etc.): L knee ace wrap in place.   Nursing cleared pt for participation in physical therapy.  Pt agreeable to PT session.    Exercises Total Joint Exercises Ankle Circles/Pumps: AROM, Strengthening, Both, 10 reps, Supine Quad Sets: AROM, Strengthening, Left, 10 reps, Supine Heel Slides: AAROM, Strengthening, Left, 10 reps, Supine Hip ABduction/ADduction: AAROM, Strengthening, Left, 10 reps, Supine Straight Leg Raises: AAROM, Strengthening, Left, 10 reps, Supine Goniometric ROM: L knee extension approximately near neutral extension and at least 80 degrees L knee flexion   Assessment/Plan    PT Assessment Patient needs continued PT services  PT Problem List Decreased strength;Decreased range of motion;Decreased activity tolerance;Decreased balance;Decreased mobility;Decreased knowledge of use of DME;Decreased knowledge of precautions;Decreased skin integrity;Pain       PT Treatment Interventions DME instruction;Gait training;Stair training;Functional mobility training;Therapeutic activities;Therapeutic exercise;Balance training;Neuromuscular re-education;Patient/family education    PT Goals (Current goals can be found in the Care Plan section)  Acute Rehab PT Goals Patient Stated Goal: to improve pain and walking PT Goal Formulation: With patient Time For Goal Achievement: 10/29/23 Potential to Achieve Goals: Good    Frequency BID     Co-evaluation               AM-PAC PT "6 Clicks" Mobility  Outcome Measure Help needed turning from your back to your side while  in a flat bed without using bedrails?: None Help needed moving from lying on your back to sitting on the side of a flat bed without using bedrails?: A Little Help needed moving to and from a bed to a chair (including a wheelchair)?: A Little Help needed standing up from a chair using your arms (e.g., wheelchair or bedside chair)?: A Little Help needed to walk in hospital room?: A Little Help needed climbing 3-5 steps with a railing? : A Little 6  Click Score: 19    End of Session Equipment Utilized During Treatment: Gait belt Activity Tolerance: Patient tolerated treatment well Patient left: in chair;with call bell/phone within reach;with SCD's reapplied;Other (comment) (polar care in place; L heel floating via towel roll).  Pt verbalized understanding to call and wait for help prior to getting up/standing/walking. Nurse Communication: Mobility status;Precautions;Weight bearing status;Other (comment) (Pt's pain status and feeling of "knot" in back of surgical knee) PT Visit Diagnosis: Other abnormalities of gait and mobility (R26.89);Muscle weakness (generalized) (M62.81);Pain Pain - Right/Left: Left Pain - part of body: Knee    Time: 3875-6433 PT Time Calculation (min) (ACUTE ONLY): 36 min   Charges:   PT Evaluation $PT Eval Low Complexity: 1 Low PT Treatments $Therapeutic Exercise: 8-22 mins $Therapeutic Activity: 8-22 mins PT General Charges $$ ACUTE PT VISIT: 1 Visit        Hendricks Limes, PT 10/15/23, 5:14 PM

## 2023-10-15 NOTE — Discharge Instructions (Signed)

## 2023-10-15 NOTE — Plan of Care (Signed)
  Problem: Activity: Goal: Risk for activity intolerance will decrease Outcome: Progressing   Problem: Nutrition: Goal: Adequate nutrition will be maintained Outcome: Progressing   Problem: Elimination: Goal: Will not experience complications related to urinary retention Outcome: Progressing   Problem: Pain Management: Goal: General experience of comfort will improve Outcome: Progressing

## 2023-10-15 NOTE — Anesthesia Postprocedure Evaluation (Signed)
Anesthesia Post Note  Patient: Jesus Mckinney  Procedure(s) Performed: TOTAL KNEE ARTHROPLASTY (Left: Knee)  Patient location during evaluation: PACU Anesthesia Type: General Level of consciousness: awake and alert Pain management: pain level controlled Vital Signs Assessment: post-procedure vital signs reviewed and stable Respiratory status: spontaneous breathing, nonlabored ventilation, respiratory function stable and patient connected to nasal cannula oxygen Cardiovascular status: blood pressure returned to baseline and stable Postop Assessment: no apparent nausea or vomiting Anesthetic complications: no  No notable events documented.   Last Vitals:  Vitals:   10/15/23 1400 10/15/23 1446  BP: (!) 111/55   Pulse: 62 66  Resp: (!) 22   Temp:    SpO2: 93% 93%    Last Pain:  Vitals:   10/15/23 1446  TempSrc:   PainSc: 0-No pain                 Stephanie Coup

## 2023-10-15 NOTE — Anesthesia Procedure Notes (Signed)
Procedure Name: Intubation Date/Time: 10/15/2023 10:13 AM  Performed by: Monico Hoar, CRNAPre-anesthesia Checklist: Patient identified, Patient being monitored, Timeout performed, Emergency Drugs available and Suction available Patient Re-evaluated:Patient Re-evaluated prior to induction Oxygen Delivery Method: Circle system utilized Preoxygenation: Pre-oxygenation with 100% oxygen Induction Type: IV induction Ventilation: Mask ventilation without difficulty Laryngoscope Size: Mac and 4 Grade View: Grade I Tube type: Oral Tube size: 7.5 mm Number of attempts: 1 Airway Equipment and Method: Stylet Placement Confirmation: ETT inserted through vocal cords under direct vision, positive ETCO2 and breath sounds checked- equal and bilateral Secured at: 21 cm Tube secured with: Tape Dental Injury: Teeth and Oropharynx as per pre-operative assessment

## 2023-10-15 NOTE — Op Note (Signed)
Patient Name: Jesus Mckinney  ZOX:[id number]  Pre-Operative Diagnosis: Left knee Osteoarthritis  Post-Operative Diagnosis: (same)  Procedure: Left Total Knee Arthroplasty  Components/Implants: Femur: Persona Size 8 CR   Tibia: Persona Size G w/ 14x39mm stem extension  Poly: 10mm MC  Patella: 35x84mm symmetric  Femoral Valgus Cut Angle: 5 degrees  Distal Femoral Re-cut: none  Patella Resurfacing: yes   Date of Surgery: 10/15/2023  Surgeon: Reinaldo Berber MD  Assistant: Amador Cunas PA (present and scrubbed throughout the case, critical for assistance with exposure, retraction, instrumentation, and closure)   Anesthesiologist: Lorette Ang  Anesthesia: General   Tourniquet Time: 56 min  EBL: 50cc  IVF: 600cc  Complications: None   Brief history: The patient is a 67 year old male with a history of osteoarthritis of the left knee with pain limiting their range of motion and activities of daily living, which has failed multiple attempts at conservative therapy.  The risks and benefits of total knee arthroplasty as definitive surgical treatment were discussed with the patient, who opted to proceed with the operation.  After outpatient medical clearance and optimization was completed the patient was admitted to [facility] for the procedure.  All preoperative films were reviewed and an appropriate surgical plan was made prior to surgery. Preoperative range of motion was 0 to 120.   Description of procedure: The patient was brought to the operating room where laterality was confirmed by all those present to be the left side.   Spinal anesthesia was administered and the patient received an intravenous dose of antibiotics for surgical prophylaxis and a dose of tranexamic acid.  Patient is positioned supine on the operating room table with all bony prominences well-padded.  A well-padded tourniquet was applied to the left thigh.  The knee was then prepped and draped in  usual sterile fashion with multiple layers of adhesive and nonadhesive drapes.  All of those present in the operating room participated in a surgical timeout laterality and patient were confirmed.   An Esmarch was wrapped around the extremity and the leg was elevated and the knee flexed.  The tourniquet was inflated to a pressure of 250 mmHg. The Esmarch was removed and the leg was brought down to full extension.  The patella and tibial tubercle identified and outlined using a marking pen and a midline skin incision was made with a knife carried through the subcutaneous tissue down to the extensor retinaculum.  After exposure of the extensor mechanism the medial parapatellar arthrotomy was performed with a scalpel and electrocautery extending down medial and distal to the tibial tubercle taking care to avoid incising the patellar tendon.   A standard medial release was performed over the proximal tibia.  The knee was brought into extension in order to excise the fat pad taking care not to damage the patella tendon.  The superior soft tissue was removed from the anterior surface of the distal femur to visualize for the procedure.  The knee was then brought into flexion with the patella subluxed laterally and subluxing the tibia anteriorly.  The ACL was transected and removed with electrocautery and additional soft tissue was removed from the proximal surface of the tibia to fully expose. The PCL was found to be intact and was preserved.  An extramedullary tibial cutting guide was then applied to the leg with a spring-loaded ankle clamp placed around the distal tibia just above the malleoli the angulation of the guide was adjusted to give some posterior slope in the tibial resection with  an appropriate varus/valgus alignment.  The resection guide was then pinned to the proximal tibia and the proximal tibial surface was resected with an oscillating saw.  Careful attention was paid to ensure the blade did not  disrupt any of the soft tissues including any lateral or medial ligament.  Attention was then turned to the femur, with the knee slightly flexed a opening drill was used to enter the medullary canal of the femur.  After removing the drill marrow was suctioned out to decompress the distal femur.  An intramedullary femoral guide was then inserted into the drill hole and the alignment guide was seated firmly against the distal end of the medial femoral condyle.  The distal femoral cutting guide was then attached and pinned securely to the anterior surface of the femur and the intramedullary rod and alignment guide was removed.  Distal femur resection was then performed with an oscillating saw with retractors protecting medial and laterally.   The distal cutting block was then removed and the extension gap was checked with a spacer.  Extension gap was found to be appropriately sized to accommodate the spacer block.   The femoral sizing guide was then placed securely into the posterior condyles of the femur and the femoral size was measured and determined to be 8.  The size 8; 4-in-1 cutting guide was placed in position and secured with 2 pins.  The anterior posterior and chamfer resections were then performed with an oscillating saw.  Bony fragments and osteophytes were then removed.  Using a lamina spreader the posterior medial and lateral condyles were checked for additional osteophytes and posterior soft tissue remnants.  Any remaining meniscus was removed at this time.  Periarticular injection was performed in the meniscal rims and posterior capsule with aspiration performed to ensure no intravascular injection.   The tibia was then exposed and the tibial trial was pinned onto the plateau after confirming appropriate orientation and rotation.  Using the drill bushing the tibia was prepared to the appropriate drill depth.  Tibial broach impactor was then driven through the punch guide using a mallet.  The  femoral trial component was then inserted onto the femur. A trial tibial polyethylene bearing was then placed and the knee was reduced.  The knee achieved full extension with no hyperextension and was found to be balanced in flexion and extension with the trials in place.  The knee was then brought into full extension the patella was everted and held with 2 Kocher clamps.  The articular surface of the patella was then resected with an patella reamer and saw after careful measurement with a caliper.  The patella was then prepared with the drill guide and a trial patella was placed.  The knee was then taken through range of motion and it was found that the patella articulated appropriately with the trochlea and good patellofemoral motion without subluxation.    The correct final components for implantation were confirmed and opened by the circulator nurse.  The prepared surfaces of the patella femur and tibia were cleaned with pulsatile lavage to remove all blood fat and other material and then the surfaces were dried.  2 bags of cement were mixed under vacuum and the components were cemented into place.  Excess cement was removed with curettes and forceps. A trial polyethylene tibial component was placed and the knee was brought into extension to allow the cement to set.  At this time the periarticular injection cocktail was placed in the soft tissues  surrounding the knee.  After full curing of the cement the balance of the knee was checked again and the final polyethylene size was confirmed. The tibial component was irrigated and locking mechanism checked to ensure it was clear of debris. The real polyethylene tibial component was implanted and the knee was brought through a range of motion.   The knee was then irrigated with copious amount of normal saline via pulsatile lavage to remove all loose bodies and other debris.  The knee was then irrigated with surgiphor betadine based wash and reirrigated with saline.   The tourniquet was then dropped and all bleeding vessels were identified and coagulated.  The arthrotomy was approximated with #1 Vicryl and closed with #1 Stratafix suture.  The knee was brought into slight flexion and the subcutaneous tissues were closed with 0 Vicryl, 2-0 Vicryl and a running subcuticular 4-0 stratafix barbed suture.  Skin was then glued with Dermabond.  A sterile adhesive dressing was then placed along with a sequential compression device to the calf, a Ted stocking, and a cryotherapy cuff.   Sponge, needle, and Lap counts were all correct at the end of the case.   The patient was transferred off of the operating room table to a hospital bed, good pulses were found distally on the operative side.  The patient was transferred to the recovery room in stable condition.

## 2023-10-15 NOTE — Interval H&P Note (Signed)
Patient history and physical updated. Consent reviewed including risks, benefits, and alternatives to surgery. Patient agrees with above plan to proceed with left total knee arthroplasty.

## 2023-10-16 ENCOUNTER — Encounter: Payer: Self-pay | Admitting: Orthopedic Surgery

## 2023-10-16 DIAGNOSIS — M1712 Unilateral primary osteoarthritis, left knee: Secondary | ICD-10-CM | POA: Diagnosis not present

## 2023-10-16 LAB — BASIC METABOLIC PANEL
Anion gap: 7 (ref 5–15)
BUN: 20 mg/dL (ref 8–23)
CO2: 21 mmol/L — ABNORMAL LOW (ref 22–32)
Calcium: 7.7 mg/dL — ABNORMAL LOW (ref 8.9–10.3)
Chloride: 105 mmol/L (ref 98–111)
Creatinine, Ser: 1.11 mg/dL (ref 0.61–1.24)
GFR, Estimated: >60 mL/min (ref >60)
Glucose, Bld: 108 mg/dL — ABNORMAL HIGH (ref 70–99)
Potassium: 4.3 mmol/L (ref 3.5–5.1)
Sodium: 133 mmol/L — ABNORMAL LOW (ref 135–145)

## 2023-10-16 LAB — CBC
HCT: 32.9 % — ABNORMAL LOW (ref 39.0–52.0)
Hemoglobin: 11.1 g/dL — ABNORMAL LOW (ref 13.0–17.0)
MCH: 32.7 pg (ref 26.0–34.0)
MCHC: 33.7 g/dL (ref 30.0–36.0)
MCV: 97.1 fL (ref 80.0–100.0)
Platelets: 199 10*3/uL (ref 150–400)
RBC: 3.39 MIL/uL — ABNORMAL LOW (ref 4.22–5.81)
RDW: 12.9 % (ref 11.5–15.5)
WBC: 13.2 10*3/uL — ABNORMAL HIGH (ref 4.0–10.5)
nRBC: 0 % (ref 0.0–0.2)

## 2023-10-16 MED ORDER — HYDROCODONE-ACETAMINOPHEN 5-325 MG PO TABS
ORAL_TABLET | ORAL | Status: AC
  Filled 2023-10-16: qty 2

## 2023-10-16 MED ORDER — DOCUSATE SODIUM 100 MG PO CAPS
ORAL_CAPSULE | ORAL | Status: AC
  Filled 2023-10-16: qty 1

## 2023-10-16 MED ORDER — ENOXAPARIN SODIUM 40 MG/0.4ML IJ SOSY: 40.0000 mg | PREFILLED_SYRINGE | INTRAMUSCULAR | 0 refills | Status: DC

## 2023-10-16 MED ORDER — TRAMADOL HCL 50 MG PO TABS: 50.0000 mg | ORAL_TABLET | Freq: Four times a day (QID) | ORAL | 0 refills | Status: DC | PRN

## 2023-10-16 MED ORDER — OXYCODONE HCL 5 MG PO TABS: 2.5000 mg | ORAL_TABLET | Freq: Three times a day (TID) | ORAL | 0 refills | Status: DC | PRN

## 2023-10-16 MED ORDER — MORPHINE SULFATE (PF) 4 MG/ML IV SOLN
INTRAVENOUS | Status: AC
  Filled 2023-10-16: qty 1

## 2023-10-16 MED ORDER — ACETAMINOPHEN 500 MG PO TABS
ORAL_TABLET | ORAL | Status: AC
  Filled 2023-10-16: qty 2

## 2023-10-16 MED ORDER — ENOXAPARIN SODIUM 30 MG/0.3ML IJ SOSY
PREFILLED_SYRINGE | INTRAMUSCULAR | Status: AC
  Filled 2023-10-16: qty 0.3

## 2023-10-16 MED ORDER — ONDANSETRON HCL 4 MG PO TABS: 4.0000 mg | ORAL_TABLET | Freq: Four times a day (QID) | ORAL | 0 refills | Status: DC | PRN

## 2023-10-16 MED ORDER — DOCUSATE SODIUM 100 MG PO CAPS: 100.0000 mg | ORAL_CAPSULE | Freq: Two times a day (BID) | ORAL | 0 refills | Status: DC

## 2023-10-16 MED ORDER — PANTOPRAZOLE SODIUM 40 MG PO TBEC
DELAYED_RELEASE_TABLET | ORAL | Status: AC
  Filled 2023-10-16: qty 1

## 2023-10-16 MED ORDER — ACETAMINOPHEN 500 MG PO TABS: 1000.0000 mg | ORAL_TABLET | Freq: Three times a day (TID) | ORAL | 0 refills | Status: DC

## 2023-10-16 MED ORDER — KETOROLAC TROMETHAMINE 15 MG/ML IJ SOLN
INTRAMUSCULAR | Status: AC
  Filled 2023-10-16: qty 1

## 2023-10-16 MED ORDER — CELECOXIB 100 MG PO CAPS: 100.0000 mg | ORAL_CAPSULE | Freq: Two times a day (BID) | ORAL | 0 refills | Status: AC

## 2023-10-16 NOTE — Progress Notes (Signed)
Nsg Discharge Note  Admit Date:  10/15/2023 Discharge date: 10/16/2023   Jesus Mckinney to be D/C'd Home per MD order.  AVS completed.   Patient/caregiver able to verbalize understanding.  Discharge Medication: Allergies as of 10/16/2023       Reactions   Bee Pollen Other (See Comments)   Dust Mite Extract Cough   Gramineae Pollens Cough        Medication List     STOP taking these medications    meloxicam 15 MG tablet Commonly known as: MOBIC   predniSONE 5 MG tablet Commonly known as: DELTASONE   traMADol-acetaminophen 37.5-325 MG tablet Commonly known as: ULTRACET       TAKE these medications    acetaminophen 500 MG tablet Commonly known as: TYLENOL Take 2 tablets (1,000 mg total) by mouth every 8 (eight) hours.   albuterol 108 (90 Base) MCG/ACT inhaler Commonly known as: VENTOLIN HFA Inhale 1-2 puffs into the lungs every 6 (six) hours as needed for wheezing or shortness of breath.   albuterol 1.25 MG/3ML nebulizer solution Commonly known as: ACCUNEB Take 3 mLs (1.25 mg total) by nebulization every 4 (four) hours as needed for wheezing or shortness of breath.   alendronate 70 MG tablet Commonly known as: FOSAMAX Take 70 mg by mouth every Wednesday.   atenolol 25 MG tablet Commonly known as: TENORMIN Take 25 mg by mouth daily.   atorvastatin 40 MG tablet Commonly known as: LIPITOR Take 40 mg by mouth daily.   azithromycin 250 MG tablet Commonly known as: ZITHROMAX Take 250 mg by mouth daily.   Breztri Aerosphere 160-9-4.8 MCG/ACT Aero Generic drug: Budeson-Glycopyrrol-Formoterol Inhale 2 puffs into the lungs 2 (two) times daily.   celecoxib 100 MG capsule Commonly known as: CeleBREX Take 1 capsule (100 mg total) by mouth 2 (two) times daily for 7 days.   docusate sodium 100 MG capsule Commonly known as: COLACE Take 1 capsule (100 mg total) by mouth 2 (two) times daily.   dupilumab 300 MG/2ML prefilled syringe Commonly known as:  DUPIXENT Inject 300 mg into the skin every 14 (fourteen) days.   enoxaparin 40 MG/0.4ML injection Commonly known as: LOVENOX Inject 0.4 mLs (40 mg total) into the skin daily for 14 days.   escitalopram 10 MG tablet Commonly known as: LEXAPRO Take 10 mg by mouth daily.   ezetimibe 10 MG tablet Commonly known as: ZETIA Take 10 mg by mouth daily.   fluticasone 50 MCG/ACT nasal spray Commonly known as: FLONASE Place 2 sprays into both nostrils daily.   lamoTRIgine 200 MG tablet Commonly known as: LAMICTAL Take 200 mg by mouth daily.   lidocaine 5 % Commonly known as: LIDODERM Place 1 patch onto the skin daily as needed (pain).   montelukast 10 MG tablet Commonly known as: SINGULAIR Take 10 mg by mouth daily.   ondansetron 4 MG tablet Commonly known as: ZOFRAN Take 1 tablet (4 mg total) by mouth every 6 (six) hours as needed for nausea.   oxyCODONE 5 MG immediate release tablet Commonly known as: Roxicodone Take 0.5-1 tablets (2.5-5 mg total) by mouth every 8 (eight) hours as needed for breakthrough pain.   pantoprazole 40 MG tablet Commonly known as: PROTONIX Take 40 mg by mouth 2 (two) times daily.   PEARL DROPS DT Place onto teeth.   pilocarpine 5 MG tablet Commonly known as: SALAGEN Take 5 mg by mouth 3 (three) times daily.   traMADol 50 MG tablet Commonly known as: ULTRAM Take 1 tablet (  50 mg total) by mouth every 6 (six) hours as needed for moderate pain (pain score 4-6).               Durable Medical Equipment  (From admission, onward)           Start     Ordered   10/16/23 0754  For home use only DME 3 n 1  Once        10/16/23 0753            Discharge Assessment: Vitals:   10/16/23 0531 10/16/23 0938  BP: 129/62 (!) 125/92  Pulse: (!) 53 (!) 58  Resp: 16 18  Temp: 98.2 F (36.8 C) 97.8 F (36.6 C)  SpO2: 94% 98%   Skin clean, dry and intact without evidence of skin break down, no evidence of skin tears noted. IV catheter  discontinued intact. Site without signs and symptoms of complications - no redness or edema noted at insertion site, patient denies c/o pain - only slight tenderness at site.  Dressing with slight pressure applied.  D/c Instructions-Education: Discharge instructions given to patient/family with verbalized understanding. D/c education completed with patient/family including follow up instructions, medication list, d/c activities limitations if indicated, with other d/c instructions as indicated by MD - patient able to verbalize understanding, all questions fully answered. Patient instructed to return to ED, call 911, or call MD for any changes in condition.  Patient escorted via WC, and D/C home via private auto.  Theodore Demark, RN 10/16/2023 12:07 PM

## 2023-10-16 NOTE — TOC Transition Note (Signed)
Transition of Care [facility]) - Discharge Note   Patient Details  Name: Jesus Mckinney MRN: [id number] Date of Birth: 12/08/1955  [facility] CM/SW Contact:  Hetty Ely, RN Phone Number: 10/16/2023, 9:46 AM   Clinical Narrative:   CM spoke with patient at bedside about discharge needs. Patient has his personal rolling walker at bedside and voices no need for new one. Patient did consent to 3IN1, which was ordered with [employer] to be delivered to the room prior to discharge.    Final next level of care: Home w Home Health Services Barriers to Discharge: Barriers Resolved   Patient Goals and CMS Choice Patient states their goals for this hospitalization and ongoing recovery are:: To return home.          Discharge Placement                    Patient and family notified of of transfer: 10/16/23  Discharge Plan and Services Additional resources added to the After Visit Summary for                  DME Arranged: 3-N-1 DME Agency: [employer] Date DME Agency Contacted: 10/16/23   Representative spoke with at DME Agency: [facility] Arranged: PT HH Agency: [facility] Date HH Agency Contacted: 10/16/23   Representative spoke with at [facility] Agency: Adele Dan  Social Drivers of Health (SDOH) Interventions SDOH Screenings   Food Insecurity: No Food Insecurity (10/15/2023)  Housing: Low Risk  (10/15/2023)  Transportation Needs: No Transportation Needs (10/15/2023)  Utilities: Not At Risk (10/15/2023)  Financial Resource Strain: Low Risk  (03/17/2023)   Received from [facility], [facility]  Physical Activity: Sufficiently Active (12/21/2018)   Received from [facility], [facility]  Social Connections: Moderately Isolated (12/21/2018)   Received from [facility], [facility]  Stress: Stress Concern Present  (12/21/2018)   Received from [facility], [place]. [facility]  Tobacco Use: Medium Risk (10/15/2023)     Readmission Risk Interventions    02/06/2022   10:08 AM  Readmission Risk Prevention Plan  Transportation Screening Complete  PCP or Specialist Appt within 3-5 Days Complete  HRI or Home Care Consult Complete  Social Work Consult for Recovery Care Planning/Counseling Complete  Palliative Care Screening Not Applicable  Medication Review (RN Care Manager) Referral to Pharmacy

## 2023-10-16 NOTE — Discharge Summary (Signed)
Physician Discharge Summary  Patient ID: Jesus Mckinney MRN: [id number] DOB/AGE: [birth year] 67 y.o.  Admit date: 10/15/2023 Discharge date: 10/16/2023  Admission Diagnoses:  S/P TKR (total knee replacement), left [Z96.652]   Discharge Diagnoses: Patient Active Problem List   Diagnosis Date Noted   S/P TKR (total knee replacement), left 10/15/2023   Sepsis due to pneumonia (HCC) 02/16/2022   Pleuritic chest pain 02/16/2022   Rib fractures 02/16/2022   Chronic obstructive pulmonary disease (COPD) (HCC) 02/16/2022   Major depression 02/16/2022   GERD without esophagitis 02/16/2022   Anxiety 02/16/2022   Essential hypertension 02/16/2022   Dyslipidemia 02/16/2022   Abdominal wall contusion 02/16/2022   CAP (community acquired pneumonia) 02/15/2022   Respiratory failure with hypoxia (HCC) 02/08/2022   Acute respiratory failure (HCC) 02/07/2022   COPD exacerbation (HCC) 02/06/2022   Parainfluenza virus bronchitis 02/06/2022   Pneumonia 02/05/2022   COPD with acute exacerbation (HCC) 02/05/2022   Obesity (BMI 30-39.9) 02/05/2022   Hyponatremia 02/05/2022   Persistent cough 04/19/2021   Stopped smoking with greater than 30 pack year history 04/19/2021   Arthropathy of cervical facet joint 04/16/2021   Dyslipidemia, goal LDL below 70 03/29/2021   Aortic atherosclerosis (HCC) 02/20/2021   Hepatic steatosis 02/20/2021   Renal cyst 01/11/2021   History of colon polyps 10/16/2020   Mild recurrent major depression (HCC) 10/16/2020   OSA (obstructive sleep apnea) 10/16/2020   Panlobular emphysema (HCC) 10/16/2020   Prediabetes 10/16/2020   Vertigo 10/16/2020   Weight gain 10/16/2020    Past Medical History:  Diagnosis Date   Anemia    Anxiety    Aortic atherosclerosis (HCC)    Arachnoid cyst of mid-thoracic spine    a.) CT and MRI 07/2023: complicated dorsal arachnoid cyst extending from T5-T9 with anterior displacement/mild spinal cord compression; greatest mass effect  at T6-T7.   Arthropathy of cervical facet joint    Asthma    Cardiomegaly    Colon polyps    COPD (chronic obstructive pulmonary disease) (HCC)    Coronary artery disease 10/12/2015   a.) LHC 10/12/2015: 10% oLM, min luminal irregs in LAD, LCx, RCA - med mgmt; b.) MPI 11/22/2019: no ischemia   Depression    a.) on multi-agent Tx using escitalopram + lamotrigene + olanzapine   Diastolic dysfunction    a.) TTE 02/05/2022: EF 60-65%, no RWMAs, G2DD   Emphysema lung (HCC)    Enthesopathy of hip region on both sides    GERD (gastroesophageal reflux disease)    Hepatic steatosis    Hiatal hernia    Hyperlipidemia 2022   Hypertension    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   Long term (current) use of immunosuppressive biologic    a.) dupilumab   Long-term corticosteroid use (prednisone)    Long-term use of aspirin therapy    Mild cognitive impairment    a.) on cholinesterase inhibitor (donepezil)   OSA on CPAP    Osteoarthritis    Osteoporosis    PAF (paroxysmal atrial fibrillation) (HCC)    a.) CHA2DS2VASc = 4 (age, HTN, vascular disease history, T2DM) as of 10/13/2023;  b.) rate/rhythm maintained on oral atenolol; no chronic anticoagulation   PSVT (paroxysmal supraventricular tachycardia) (HCC)    Renal cyst    Sjogren's disease (HCC)    T2DM (type 2 diabetes mellitus) (HCC)    Trochanteric bursitis of left hip    Vertigo      Transfusion: none   Consultants (if any):   Discharged Condition: Improved  Hospital  Course: Jesus Mckinney is an 67 y.o. male who was admitted 10/15/2023 with a diagnosis of S/P TKR (total knee replacement), left and went to the operating room on 10/15/2023 and underwent the above named procedures.    Surgeries: Procedure(s): TOTAL KNEE ARTHROPLASTY on 10/15/2023 Patient tolerated the surgery well. Taken to PACU where she was stabilized and then transferred to the orthopedic floor.  Started on Lovenox 30 mg q 12 hrs. TEDs and SCDs  applied bilaterally. Heels elevated on bed. No evidence of DVT. Negative Homan. Physical therapy started on day #1 for gait training and transfer. OT started day #1 for ADL and assisted devices.  Patient's IV was d/c on day #1. Patient was able to safely and independently complete all PT goals. PT recommending discharge to home.    On post op day #1 patient was stable and ready for discharge to home with HHPT.  Implants: Femur: Persona Size 8 CR   Tibia: Persona Size G w/ 14x47mm stem extension  Poly: 10mm MC  Patella: 35x73mm symmetric    He was given perioperative antibiotics:  Anti-infectives (From admission, onward)    Start     Dose/Rate Route Frequency Ordered Stop   10/15/23 1630  azithromycin (ZITHROMAX) tablet 250 mg        250 mg Oral Daily 10/15/23 1546     10/15/23 1630  ceFAZolin (ANCEF) IVPB 2g/100 mL premix        2 g 200 mL/hr over 30 Minutes Intravenous Every 6 hours 10/15/23 1532 10/16/23 0107   10/15/23 0600  ceFAZolin (ANCEF) IVPB 2g/100 mL premix        2 g 200 mL/hr over 30 Minutes Intravenous On call to O.R. 10/14/23 2159 10/15/23 1031     .  He was given sequential compression devices, early ambulation, and Lovenox TEDs for DVT prophylaxis.  He benefited maximally from the hospital stay and there were no complications.    Recent vital signs:  Vitals:   10/15/23 1937 10/16/23 0531  BP: (!) 121/51 129/62  Pulse: 60 (!) 53  Resp: 14 16  Temp: 97.8 F (36.6 C) 98.2 F (36.8 C)  SpO2: 94% 94%    Recent laboratory studies:  Lab Results  Component Value Date   HGB 11.1 (L) 10/16/2023   HGB 14.0 10/06/2023   HGB 13.8 04/01/2023   Lab Results  Component Value Date   WBC 13.2 (H) 10/16/2023   PLT 199 10/16/2023   Lab Results  Component Value Date   INR 0.9 02/15/2022   Lab Results  Component Value Date   NA 133 (L) 10/16/2023   K 4.3 10/16/2023   CL 105 10/16/2023   CO2 21 (L) 10/16/2023   BUN 20 10/16/2023   CREATININE 1.11 10/16/2023    GLUCOSE 108 (H) 10/16/2023    Discharge Medications:   Allergies as of 10/16/2023       Reactions   Bee Pollen Other (See Comments)   Dust Mite Extract Cough   Gramineae Pollens Cough        Medication List     STOP taking these medications    meloxicam 15 MG tablet Commonly known as: MOBIC   predniSONE 5 MG tablet Commonly known as: DELTASONE   traMADol-acetaminophen 37.5-325 MG tablet Commonly known as: ULTRACET       TAKE these medications    acetaminophen 500 MG tablet Commonly known as: TYLENOL Take 2 tablets (1,000 mg total) by mouth every 8 (eight) hours.   albuterol 108 (  90 Base) MCG/ACT inhaler Commonly known as: VENTOLIN HFA Inhale 1-2 puffs into the lungs every 6 (six) hours as needed for wheezing or shortness of breath.   albuterol 1.25 MG/3ML nebulizer solution Commonly known as: ACCUNEB Take 3 mLs (1.25 mg total) by nebulization every 4 (four) hours as needed for wheezing or shortness of breath.   alendronate 70 MG tablet Commonly known as: FOSAMAX Take 70 mg by mouth every Wednesday.   atenolol 25 MG tablet Commonly known as: TENORMIN Take 25 mg by mouth daily.   atorvastatin 40 MG tablet Commonly known as: LIPITOR Take 40 mg by mouth daily.   azithromycin 250 MG tablet Commonly known as: ZITHROMAX Take 250 mg by mouth daily.   Breztri Aerosphere 160-9-4.8 MCG/ACT Aero Generic drug: Budeson-Glycopyrrol-Formoterol Inhale 2 puffs into the lungs 2 (two) times daily.   celecoxib 100 MG capsule Commonly known as: CeleBREX Take 1 capsule (100 mg total) by mouth 2 (two) times daily for 7 days.   docusate sodium 100 MG capsule Commonly known as: COLACE Take 1 capsule (100 mg total) by mouth 2 (two) times daily.   dupilumab 300 MG/2ML prefilled syringe Commonly known as: DUPIXENT Inject 300 mg into the skin every 14 (fourteen) days.   enoxaparin 40 MG/0.4ML injection Commonly known as: LOVENOX Inject 0.4 mLs (40 mg total) into the  skin daily for 14 days.   escitalopram 10 MG tablet Commonly known as: LEXAPRO Take 10 mg by mouth daily.   ezetimibe 10 MG tablet Commonly known as: ZETIA Take 10 mg by mouth daily.   fluticasone 50 MCG/ACT nasal spray Commonly known as: FLONASE Place 2 sprays into both nostrils daily.   lamoTRIgine 200 MG tablet Commonly known as: LAMICTAL Take 200 mg by mouth daily.   lidocaine 5 % Commonly known as: LIDODERM Place 1 patch onto the skin daily as needed (pain).   montelukast 10 MG tablet Commonly known as: SINGULAIR Take 10 mg by mouth daily.   ondansetron 4 MG tablet Commonly known as: ZOFRAN Take 1 tablet (4 mg total) by mouth every 6 (six) hours as needed for nausea.   oxyCODONE 5 MG immediate release tablet Commonly known as: Roxicodone Take 0.5-1 tablets (2.5-5 mg total) by mouth every 8 (eight) hours as needed for breakthrough pain.   pantoprazole 40 MG tablet Commonly known as: PROTONIX Take 40 mg by mouth 2 (two) times daily.   PEARL DROPS DT Place onto teeth.   pilocarpine 5 MG tablet Commonly known as: SALAGEN Take 5 mg by mouth 3 (three) times daily.   traMADol 50 MG tablet Commonly known as: ULTRAM Take 1 tablet (50 mg total) by mouth every 6 (six) hours as needed for moderate pain (pain score 4-6).               Durable Medical Equipment  (From admission, onward)           Start     Ordered   10/16/23 0754  For home use only DME 3 n 1  Once        10/16/23 0753            Diagnostic Studies: DG Knee Left Port Result Date: 10/15/2023 CLINICAL DATA:  Postop left total knee replacement. EXAM: PORTABLE LEFT KNEE - 1-2 VIEW COMPARISON:  None Available. FINDINGS: Status post left total knee arthroplasty. The hardware appears well positioned. No evidence of acute fracture or dislocation. Mild irregularity of the fibular head appears nonacute, likely due to fragmented spurring.  There is a small amount of air in the joint and  surrounding soft tissues. No unexpected foreign body. IMPRESSION: Status post left total knee arthroplasty without evidence of acute complication. Electronically Signed   By: Carey Bullocks M.D.   On: 10/15/2023 16:37    Disposition:      Follow-up Information     Evon Slack, PA-C Follow up in 2 week(s).   Specialties: Orthopedic Surgery, Emergency Medicine Contact information: [address] [place] [address] [phone]                  Signed: Patience Musca 10/16/2023, 8:02 AM

## 2023-10-16 NOTE — Progress Notes (Signed)
Subjective: 1 Day Post-Op Procedure(s) (LRB): TOTAL KNEE ARTHROPLASTY (Left) Patient reports pain as mild.   Patient is well, and has had no acute complaints or problems Denies any CP, SOB, ABD pain. We will continue therapy today.  Plan is to go Home after hospital stay.  Objective: Vital signs in last 24 hours: Temp:  [97.4 F (36.3 C)-98.2 F (36.8 C)] 98.2 F (36.8 C) (12/20 0531) Pulse Rate:  [53-74] 53 (12/20 0531) Resp:  [12-28] 16 (12/20 0531) BP: (106-129)/(36-62) 129/62 (12/20 0531) SpO2:  [84 %-97 %] 94 % (12/20 0531) Weight:  [86.8 kg] 86.8 kg (12/19 0855)  Intake/Output from previous day: 12/19 0701 - 12/20 0700 In: 838.2 [I.V.:738.2; IV Piggyback:100] Out: 320 [Urine:300; Blood:20] Intake/Output this shift: No intake/output data recorded.  Recent Labs    10/16/23 0659  HGB 11.1*   Recent Labs    10/16/23 0659  WBC 13.2*  RBC 3.39*  HCT 32.9*  PLT 199   Recent Labs    10/16/23 0659  NA 133*  K 4.3  CL 105  CO2 21*  BUN 20  CREATININE 1.11  GLUCOSE 108*  CALCIUM 7.7*   No results for input(s): "LABPT", "INR" in the last 72 hours.  EXAM General - Patient is Alert, Appropriate, and Oriented Extremity - Neurovascular intact Sensation intact distally Intact pulses distally Dorsiflexion/Plantar flexion intact No cellulitis present Compartment soft Dressing - dressing C/D/I and no drainage Motor Function - intact, moving foot and toes well on exam.   Past Medical History:  Diagnosis Date   Anemia    Anxiety    Aortic atherosclerosis (HCC)    Arachnoid cyst of mid-thoracic spine    a.) CT and MRI 07/2023: complicated dorsal arachnoid cyst extending from T5-T9 with anterior displacement/mild spinal cord compression; greatest mass effect at T6-T7.   Arthropathy of cervical facet joint    Asthma    Cardiomegaly    Colon polyps    COPD (chronic obstructive pulmonary disease) (HCC)    Coronary artery disease 10/12/2015   a.) LHC  10/12/2015: 10% oLM, min luminal irregs in LAD, LCx, RCA - med mgmt; b.) MPI 11/22/2019: no ischemia   Depression    a.) on multi-agent Tx using escitalopram + lamotrigene + olanzapine   Diastolic dysfunction    a.) TTE 02/05/2022: EF 60-65%, no RWMAs, G2DD   Emphysema lung (HCC)    Enthesopathy of hip region on both sides    GERD (gastroesophageal reflux disease)    Hepatic steatosis    Hiatal hernia    Hyperlipidemia 2022   Hypertension    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   Long term (current) use of immunosuppressive biologic    a.) dupilumab   Long-term corticosteroid use (prednisone)    Long-term use of aspirin therapy    Mild cognitive impairment    a.) on cholinesterase inhibitor (donepezil)   OSA on CPAP    Osteoarthritis    Osteoporosis    PAF (paroxysmal atrial fibrillation) (HCC)    a.) CHA2DS2VASc = 4 (age, HTN, vascular disease history, T2DM) as of 10/13/2023;  b.) rate/rhythm maintained on oral atenolol; no chronic anticoagulation   PSVT (paroxysmal supraventricular tachycardia) (HCC)    Renal cyst    Sjogren's disease (HCC)    T2DM (type 2 diabetes mellitus) (HCC)    Trochanteric bursitis of left hip    Vertigo     Assessment/Plan:   1 Day Post-Op Procedure(s) (LRB): TOTAL KNEE ARTHROPLASTY (Left) Principal Problem:   S/P TKR (  total knee replacement), left  Estimated body mass index is 31.85 kg/m as calculated from the following:   Height as of this encounter: 5\' 5"  (1.651 m).   Weight as of this encounter: 86.8 kg. Advance diet Up with therapy Pain well controlled Labs and VSS CM to assist with discharge to home with HHPT today  DVT Prophylaxis - Lovenox, TED hose, and SCDs Weight-Bearing as tolerated to left leg   T. Cranston Neighbor, PA-C [facility] 10/16/2023, 7:58 AM

## 2023-10-16 NOTE — Progress Notes (Cosign Needed)
Patient is not able to walk the distance required to go the bathroom, or he/she is unable to safely negotiate stairs required to access the bathroom.  A BSC will alleviate this problem.

## 2023-10-16 NOTE — Plan of Care (Signed)
Documented

## 2023-10-16 NOTE — Progress Notes (Signed)
Physical Therapy Treatment Patient Details Name: Jesus Mckinney MRN: [id number] DOB: [birth year] Today's Date: 10/16/2023   History of Present Illness Pt is a 67 y.o. male s/p L TKA secondary to OA 10/15/23.  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    PT Comments  Pt resting in recliner upon PT arrival; agreeable to therapy.  Pain L knee 2/10 at rest beginning of session and 5/10 at rest end of session (nurse notified of pt's request for pain meds).  Pt did well performing LE ex's (HEP handout reviewed with pt; pt appearing with appropriate understanding).  During session pt SBA with transfers; SBA with ambulation 200 feet with RW use; and CGA to navigate steps with UE support.  Pt appearing steady and safe with functional mobility.  Reviewed home safety, fall prevention, and safe car transfers: pt verbalizing appropriate understanding.  Pt appears safe to discharge home with support of family.  Pt reports no questions/concerns for home discharge.  Nurse updated on pt's status.   If plan is discharge home, recommend the following: A little help with walking and/or transfers;A little help with bathing/dressing/bathroom;Assistance with cooking/housework;Assist for transportation;Help with stairs or ramp for entrance   Can travel by private vehicle      Yes  Equipment Recommendations  Rolling walker (2 wheels);BSC/3in1    Recommendations for Other Services       Precautions / Restrictions Precautions Precautions: Fall;Knee Precaution Booklet Issued: Yes (comment) Restrictions Weight Bearing Restrictions Per Provider Order: Yes LLE Weight Bearing Per Provider Order: Weight bearing as tolerated     Mobility  Bed Mobility               General bed mobility  comments: Deferred (pt in recliner beginning/end of session; pt reports no issues with bed mobility)    Transfers Overall transfer level: Needs assistance Equipment used: Rolling walker (2 wheels) Transfers: Sit to/from Stand Sit to Stand: Supervision           General transfer comment: fairly strong stand up to RW    Ambulation/Gait Ambulation/Gait assistance: Supervision Gait Distance (Feet): 200 Feet Assistive device: Rolling walker (2 wheels) Gait Pattern/deviations: Antalgic Gait velocity: decreased     General Gait Details: mild decreased stance time L LE; partial step through gait pattern; steady with RW use   Stairs Stairs: Yes Stairs assistance: Contact guard assist Stair Management: Step to pattern, Forwards Number of Stairs: 4 General stair comments: ascended/descended 3 steps with B railings; ascended/descended 1 step with RW; initial vc's for LE sequencing; steady and safe   Wheelchair Mobility     Tilt Bed    Modified Rankin (Stroke Patients Only)       Balance Overall balance assessment: Needs assistance Sitting-balance support: No upper extremity supported, Feet supported Sitting balance-Leahy Scale: Normal Sitting balance - Comments: steady reaching outside BOS   Standing balance support: No upper extremity supported Standing balance-Leahy Scale: Good Standing balance comment: steady reaching within BOS                            Cognition Arousal: Alert Behavior During Therapy: WFL for tasks assessed/performed Overall Cognitive Status: Within Functional Limits for tasks assessed  General Comments: Pt reports having difficulty with his memory but did fairly well with remembering things during session (including things that happened yesterday).        Exercises Total Joint Exercises Ankle Circles/Pumps: AROM, Strengthening, Both, 10 reps, Supine Quad Sets: AROM, Strengthening,  Left, 10 reps, Supine Short Arc Quad: AROM, Strengthening, Left, 10 reps, Supine Heel Slides: AROM, Strengthening, Left, 10 reps, Supine Hip ABduction/ADduction: AROM, Strengthening, Left, 10 reps, Supine Straight Leg Raises: AROM, Strengthening, Left, 10 reps, Supine Long Arc Quad: AROM, Strengthening, Left, 10 reps, Seated Knee Flexion: AROM, Strengthening, Left, 10 reps, Seated Goniometric ROM: L knee AROM 0-105 degrees    General Comments General comments (skin integrity, edema, etc.): L knee dressing in place      Pertinent Vitals/Pain Pain Assessment Pain Assessment: 0-10 Pain Score: 5  Pain Location: L knee Pain Descriptors / Indicators: Discomfort Pain Intervention(s): Limited activity within patient's tolerance, Monitored during session, Repositioned, Patient requesting pain meds-RN notified, Other (comment) (polar care applied) Vitals (HR and SpO2 on room air) stable and WFL throughout treatment session.    Home Living                          Prior Function            PT Goals (current goals can now be found in the care plan section) Acute Rehab PT Goals Patient Stated Goal: to improve pain and walking PT Goal Formulation: With patient Time For Goal Achievement: 10/29/23 Potential to Achieve Goals: Good Progress towards PT goals: Progressing toward goals    Frequency    BID      PT Plan      Co-evaluation              AM-PAC PT "6 Clicks" Mobility   Outcome Measure  Help needed turning from your back to your side while in a flat bed without using bedrails?: None Help needed moving from lying on your back to sitting on the side of a flat bed without using bedrails?: None Help needed moving to and from a bed to a chair (including a wheelchair)?: A Little Help needed standing up from a chair using your arms (e.g., wheelchair or bedside chair)?: A Little Help needed to walk in hospital room?: A Little Help needed climbing 3-5 steps with  a railing? : A Little 6 Click Score: 20    End of Session Equipment Utilized During Treatment: Gait belt Activity Tolerance: Patient tolerated treatment well Patient left: in chair;with call bell/phone within reach;with SCD's reapplied;Other (comment) (polar care in place) Nurse Communication: Mobility status;Precautions;Weight bearing status;Patient requests pain meds PT Visit Diagnosis: Other abnormalities of gait and mobility (R26.89);Muscle weakness (generalized) (M62.81);Pain Pain - Right/Left: Left Pain - part of body: Knee     Time: 2956-2130 PT Time Calculation (min) (ACUTE ONLY): 23 min  Charges:    $Gait Training: 8-22 mins $Therapeutic Exercise: 8-22 mins PT General Charges $$ ACUTE PT VISIT: 1 Visit                     Hendricks Limes, PT 10/16/23, 12:55 PM

## 2023-10-16 NOTE — Progress Notes (Signed)
Patient is not able to walk the distance required to go the bathroom, or he is unable to safely negotiate stairs required to access the bathroom.  A 3in1 BSC will alleviate this problem.        T. Chris Duchess Armendarez, PA-C [facility] 

## 2023-11-23 IMAGING — DX DG CHEST 1V PORT
1 series · 1 of 1 positions shown · non-contrast
Comparison: September 25, 2021

CLINICAL DATA: Shortness of breath.

EXAM:
PORTABLE CHEST 1 VIEW

[chest ap]
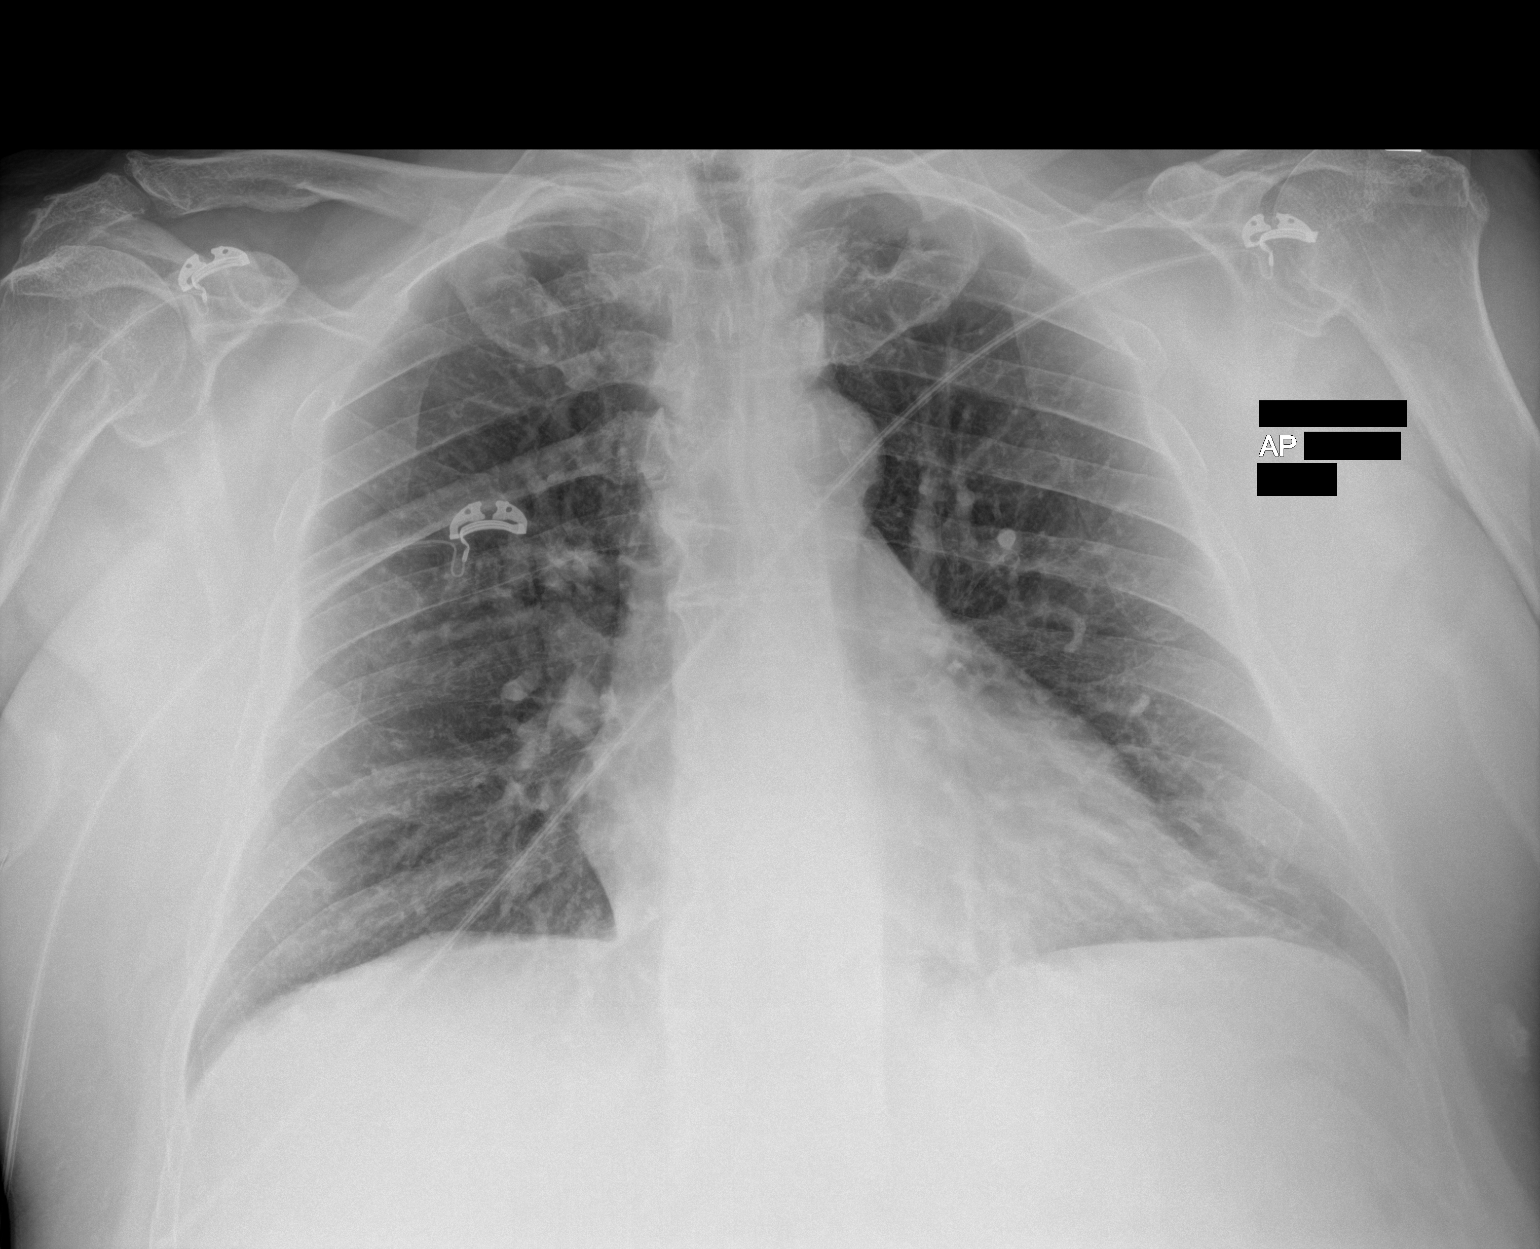

[1 of 1 positions shown; findings below may reference images not displayed]

FINDINGS: The heart size and mediastinal contours are within normal limits.
Both lungs are clear. Degenerative changes are noted throughout the
thoracic spine.
IMPRESSION: No active disease.

## 2023-11-24 ENCOUNTER — Ambulatory Visit: Payer: Medicare HMO | Admitting: Podiatry

## 2023-11-24 ENCOUNTER — Encounter: Payer: Self-pay | Admitting: Podiatry

## 2023-11-24 DIAGNOSIS — L600 Ingrowing nail: Secondary | ICD-10-CM

## 2023-11-24 NOTE — Progress Notes (Signed)
Chief Complaint  Patient presents with   Nail Problem    "I have an ingrown toenail on my left big toe.  I need the big toenail on the right trimmed so it doesn't become an ingrown toenail." N - ingrown toenail L - hallux left D - over 1 month O - suddenly, off and on C - sore, throb A - shoes, pressure T - none    Subjective: Patient presents today for evaluation of pain to the medial border left great toe. Patient is concerned for possible ingrown nail.  It is very sensitive to touch.  Patient presents today for further treatment and evaluation.  Patient also has a history of partial nail matricectomy to the medial border of the right great toe early last year.  He says that recently he has began to develop recurrent soreness to the medial border the right great toe concerning for possible recurrent ingrown toenail  Past Medical History:  Diagnosis Date   Anemia    Anxiety    Aortic atherosclerosis (HCC)    Arachnoid cyst of mid-thoracic spine    a.) CT and MRI 07/2023: complicated dorsal arachnoid cyst extending from T5-T9 with anterior displacement/mild spinal cord compression; greatest mass effect at T6-T7.   Arthropathy of cervical facet joint    Asthma    Cardiomegaly    Colon polyps    COPD (chronic obstructive pulmonary disease) (HCC)    Coronary artery disease 10/12/2015   a.) LHC 10/12/2015: 10% oLM, min luminal irregs in LAD, LCx, RCA - med mgmt; b.) MPI 11/22/2019: no ischemia   Depression    a.) on multi-agent Tx using escitalopram + lamotrigene + olanzapine   Diastolic dysfunction    a.) TTE 02/05/2022: EF 60-65%, no RWMAs, G2DD   Emphysema lung (HCC)    Enthesopathy of hip region on both sides    GERD (gastroesophageal reflux disease)    Hepatic steatosis    Hiatal hernia    Hyperlipidemia 2022   Hypertension    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   Long term (current) use of immunosuppressive biologic    a.) dupilumab   Long-term  corticosteroid use (prednisone)    Long-term use of aspirin therapy    Mild cognitive impairment    a.) on cholinesterase inhibitor (donepezil)   OSA on CPAP    Osteoarthritis    Osteoporosis    PAF (paroxysmal atrial fibrillation) (HCC)    a.) CHA2DS2VASc = 4 (age, HTN, vascular disease history, T2DM) as of 10/13/2023;  b.) rate/rhythm maintained on oral atenolol; no chronic anticoagulation   PSVT (paroxysmal supraventricular tachycardia) (HCC)    Renal cyst    Sjogren's disease (HCC)    T2DM (type 2 diabetes mellitus) (HCC)    Trochanteric bursitis of left hip    Vertigo     Past Surgical History:  Procedure Laterality Date   COLONOSCOPY     COLONOSCOPY N/A 03/11/2023   Procedure: COLONOSCOPY;  Surgeon: Toledo, Boykin Nearing, MD;  Location: [facility];  Service: Gastroenterology;  Laterality: N/A;   ESOPHAGOGASTRODUODENOSCOPY     ESOPHAGOGASTRODUODENOSCOPY N/A 03/11/2023   Procedure: ESOPHAGOGASTRODUODENOSCOPY (EGD);  Surgeon: Toledo, Boykin Nearing, MD;  Location: [facility];  Service: Gastroenterology;  Laterality: N/A;   HERNIA REPAIR     KNEE ARTHROSCOPY     LEFT HEART CATH AND CORONARY ANGIOGRAPHY Left 10/12/2015   LOOP RECORDER INSERTION N/A 12/03/2016   TOTAL KNEE ARTHROPLASTY Right    TOTAL KNEE ARTHROPLASTY Left 10/15/2023   Procedure: TOTAL KNEE  ARTHROPLASTY;  Surgeon: Reinaldo Berber, MD;  Location: [facility];  Service: Orthopedics;  Laterality: Left;    Allergies  Allergen Reactions   Bee Pollen Other (See Comments)   Dust Mite Extract Cough   Gramineae Pollens Cough    Objective:  General: Well developed, nourished, in no acute distress, alert and oriented x3   Dermatology: Skin is warm, dry and supple bilateral.  Medial border left great toe is tender with evidence of an ingrowing nail. Pain on palpation noted to the border of the nail fold. The remaining nails appear unremarkable at this time.  There is some dystrophic nail and debris noted to the medial  border of the right great toe with associated tenderness with palpation concerning for possible residual ingrown toenail  Vascular: DP and PT pulses palpable.  No clinical evidence of vascular compromise  Neruologic: Grossly intact via light touch bilateral.  Musculoskeletal: No pedal deformity noted  Assesement: #1 Paronychia with ingrowing nail medial border left great toe #2 history of partial nail matricectomy MED border RT great toe 01/05/2023 #3 recurrent ingrown toenail medial border of the right great toe  Plan of Care:  -Patient evaluated.  -In regards to the medial border the right great toe the nail plate was debrided and the offending border of the nail plate intruding into the medial border of the soft tissue was debrided away and the patient felt significant relief. -Discussed treatment alternatives and plan of care. Explained nail avulsion procedure and post procedure course to patient. -Patient opted for permanent partial nail avulsion of the ingrown portion of the nail.  -Prior to procedure, local anesthesia infiltration utilized using 3 ml of a 50:50 mixture of 2% plain lidocaine and 0.5% plain marcaine in a normal hallux block fashion and a betadine prep performed.  -Partial permanent nail avulsion with chemical matrixectomy performed using 3x30sec applications of phenol followed by alcohol flush.  -Light dressing applied.  Post care instructions provided -Return to clinic 3 weeks  Felecia Shelling, DPM [facility]  Dr. Felecia Shelling, DPM    [address] [place], [place] [address]                Office [phone]  Fax [phone]

## 2023-12-15 ENCOUNTER — Ambulatory Visit: Payer: Medicare HMO | Admitting: Neurosurgery

## 2023-12-15 VITALS — BP 132/78 | Ht 65.0 in | Wt 191.0 lb

## 2023-12-15 DIAGNOSIS — G96198 Other disorders of meninges, not elsewhere classified: Secondary | ICD-10-CM

## 2023-12-15 DIAGNOSIS — M7632 Iliotibial band syndrome, left leg: Secondary | ICD-10-CM | POA: Diagnosis not present

## 2023-12-15 NOTE — Progress Notes (Signed)
Referring Physician:  Mick Sell, MD [address] [place],  [place] [address]  Primary Physician:  Mick Sell, MD  History of Present Illness: 12/15/2023 Since I last saw him, he has had his left knee replaced.  He is doing okay from that.  He continues to have pain around his left hip.  That has not improved since his surgery and June 2024.  It continues to bother him to walk.  He has been doing exercises for iliotibial band syndrome.  09/29/2023 [name] is here today with a chief complaint of pain around his thoracic spine as well as pain into the left lateral hip area that extends down towards his knee.  He has been having pain for approximately 6 months.  He is limping due to this pain.  He has trouble with increasing pain with laying down, walking, and driving.  Sitting helps his pain.  He is scheduled for knee replacement due to pain around his left knee.  He underwent a left hip procedure earlier this year without significant improvement in his pain.  He has had injections without improvement.  He specifically denies sensory changes or weakness in his lower extremities.  He denies balance issues.  Bowel/Bladder Dysfunction: none  Conservative measures: saw a chiropractor  Physical therapy:  has participated in multiple times without relief Multimodal medical therapy including regular antiinflammatories:  lidocaine patch, hydrocodone, meloxicam Injections:  has received epidural steroid injections 07/31/23: Bilateral S1 TF ESI (50% relief)  Past Surgery: no previous spinal surgeries  Jesus Mckinney has no symptoms of cervical myelopathy.  The symptoms are causing a significant impact on the patient's life.   I have utilized the care everywhere function in epic to review the outside records available from external health systems.  Review of Systems:  A 10 point review of systems is negative, except for the pertinent positives and negatives  detailed in the HPI.  Past Medical History: Past Medical History:  Diagnosis Date   Anemia    Anxiety    Aortic atherosclerosis (HCC)    Arachnoid cyst of mid-thoracic spine    a.) CT and MRI 07/2023: complicated dorsal arachnoid cyst extending from T5-T9 with anterior displacement/mild spinal cord compression; greatest mass effect at T6-T7.   Arthropathy of cervical facet joint    Asthma    Cardiomegaly    Colon polyps    COPD (chronic obstructive pulmonary disease) (HCC)    Coronary artery disease 10/12/2015   a.) LHC 10/12/2015: 10% oLM, min luminal irregs in LAD, LCx, RCA - med mgmt; b.) MPI 11/22/2019: no ischemia   Depression    a.) on multi-agent Tx using escitalopram + lamotrigene + olanzapine   Diastolic dysfunction    a.) TTE 02/05/2022: EF 60-65%, no RWMAs, G2DD   Emphysema lung (HCC)    Enthesopathy of hip region on both sides    GERD (gastroesophageal reflux disease)    Hepatic steatosis    Hiatal hernia    Hyperlipidemia 2022   Hypertension    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   Long term (current) use of immunosuppressive biologic    a.) dupilumab   Long-term corticosteroid use (prednisone)    Long-term use of aspirin therapy    Mild cognitive impairment    a.) on cholinesterase inhibitor (donepezil)   OSA on CPAP    Osteoarthritis    Osteoporosis    PAF (paroxysmal atrial fibrillation) (HCC)    a.) CHA2DS2VASc = 4 (age, HTN,  vascular disease history, T2DM) as of 10/13/2023;  b.) rate/rhythm maintained on oral atenolol; no chronic anticoagulation   PSVT (paroxysmal supraventricular tachycardia) (HCC)    Renal cyst    Sjogren's disease (HCC)    T2DM (type 2 diabetes mellitus) (HCC)    Trochanteric bursitis of left hip    Vertigo     Past Surgical History: Past Surgical History:  Procedure Laterality Date   COLONOSCOPY     COLONOSCOPY N/A 03/11/2023   Procedure: COLONOSCOPY;  Surgeon: Toledo, Boykin Nearing, MD;  Location: [facility];   Service: Gastroenterology;  Laterality: N/A;   ESOPHAGOGASTRODUODENOSCOPY     ESOPHAGOGASTRODUODENOSCOPY N/A 03/11/2023   Procedure: ESOPHAGOGASTRODUODENOSCOPY (EGD);  Surgeon: Toledo, Boykin Nearing, MD;  Location: [facility];  Service: Gastroenterology;  Laterality: N/A;   HERNIA REPAIR     KNEE ARTHROSCOPY     LEFT HEART CATH AND CORONARY ANGIOGRAPHY Left 10/12/2015   LOOP RECORDER INSERTION N/A 12/03/2016   TOTAL KNEE ARTHROPLASTY Right    TOTAL KNEE ARTHROPLASTY Left 10/15/2023   Procedure: TOTAL KNEE ARTHROPLASTY;  Surgeon: Reinaldo Berber, MD;  Location: [facility];  Service: Orthopedics;  Laterality: Left;    Allergies: Allergies as of 12/15/2023 - Review Complete 12/15/2023  Allergen Reaction Noted   Bee pollen Other (See Comments) 01/05/2014   Dust mite extract Cough 10/16/2020   Gramineae pollens Cough 10/16/2020    Medications:  Current Outpatient Medications:    acetaminophen (TYLENOL) 500 MG tablet, Take 2 tablets (1,000 mg total) by mouth every 8 (eight) hours., Disp: 30 tablet, Rfl: 0   albuterol (ACCUNEB) 1.25 MG/3ML nebulizer solution, Take 3 mLs (1.25 mg total) by nebulization every 4 (four) hours as needed for wheezing or shortness of breath., Disp: 75 mL, Rfl: 1   albuterol (VENTOLIN HFA) 108 (90 Base) MCG/ACT inhaler, Inhale 1-2 puffs into the lungs every 6 (six) hours as needed for wheezing or shortness of breath., Disp: , Rfl:    alendronate (FOSAMAX) 70 MG tablet, Take 70 mg by mouth every Wednesday., Disp: , Rfl:    atenolol (TENORMIN) 25 MG tablet, Take 25 mg by mouth daily., Disp: , Rfl:    atorvastatin (LIPITOR) 40 MG tablet, Take 40 mg by mouth daily., Disp: , Rfl:    Budeson-Glycopyrrol-Formoterol (BREZTRI AEROSPHERE) 160-9-4.8 MCG/ACT AERO, Inhale 2 puffs into the lungs 2 (two) times daily., Disp: , Rfl:    Dentifrices (PEARL DROPS DT), Place onto teeth., Disp: , Rfl:    dupilumab (DUPIXENT) 300 MG/2ML prefilled syringe, Inject 300 mg into the skin every  14 (fourteen) days., Disp: , Rfl:    escitalopram (LEXAPRO) 10 MG tablet, Take 10 mg by mouth daily., Disp: , Rfl:    ezetimibe (ZETIA) 10 MG tablet, Take 10 mg by mouth daily., Disp: , Rfl:    lamoTRIgine (LAMICTAL) 200 MG tablet, Take 200 mg by mouth daily., Disp: , Rfl:    lidocaine (LIDODERM) 5 %, Place 1 patch onto the skin daily as needed (pain)., Disp: , Rfl:    pilocarpine (SALAGEN) 5 MG tablet, Take 5 mg by mouth 3 (three) times daily., Disp: , Rfl:    traMADol (ULTRAM) 50 MG tablet, Take 1 tablet (50 mg total) by mouth every 6 (six) hours as needed for moderate pain (pain score 4-6)., Disp: 42 tablet, Rfl: 0  Social History: Social History   Tobacco Use   Smoking status: Former    Types: Cigarettes   Smokeless tobacco: Never  Vaping Use   Vaping status: Never Used  Substance Use  Topics   Alcohol use: Not Currently   Drug use: Not Currently    Family Medical History: No family history on file.  Physical Examination: Vitals:   12/15/23 0933  BP: 132/78    General: Patient is in no apparent distress. Attention to examination is appropriate.  Neck:   Supple.  Full range of motion.  Respiratory: Patient is breathing without any difficulty.   NEUROLOGICAL:     Awake, alert, oriented to person, place, and time.  Speech is clear and fluent.   Cranial Nerves: Pupils equal round and reactive to light.  Facial tone is symmetric.  Facial sensation is symmetric. Shoulder shrug is symmetric. Tongue protrusion is midline.  There is no pronator drift.  Strength: Side Biceps Triceps Deltoid Interossei Grip Wrist Ext. Wrist Flex.  R 5 5 5 5 5 5 5   L 5 5 5 5 5 5 5    Side Iliopsoas Quads Hamstring PF DF EHL  R 5 5 5 5 5 5   L 5 5 5 5 5 5    Reflexes are 1+ and symmetric at the biceps, triceps, brachioradialis, patella and achilles.   Hoffman's is absent.   Bilateral upper and lower extremity sensation is intact to light touch.    No evidence of dysmetria noted.  Gait is  abnormal - L knee pain.     Medical Decision Making  Imaging: MRI T spine 08/17/2023 IMPRESSION: 1. Effacement of the ventral CSF space from T6-7 through T8 with associated flattening of the spinal cord at the C6-7 level, suspicious for ventral cord herniation versus dorsal arachnoid cyst. Recommend further evaluation with CT myelography of the thoracic spine. 2. Small left central disc protrusion at T1-2 with associated mild flattening of the left ventral cord.     Electronically Signed   By: Orvan Falconer M.D.   On: 08/17/2023 19:55    CT Myelo 08/20/2023 IMPRESSION: 1. Complicated dorsal arachnoid cyst extending from approximately T5-T9 resulting in anterior displacement and mild compression of the thoracic spinal cord, with greatest mass effect at the T6-7 level. Suggestion of possible loculation along the superior and left lateral aspect of the lesion. 2. Diffuse idiopathic skeletal hyperostosis.   Aortic Atherosclerosis (ICD10-I70.0).     Electronically Signed   By: Orvan Falconer M.D.   On: 08/20/2023 14:00  MRI L spine 07/16/2023 IMPRESSION: 1. L3-L4 mild right neural foraminal narrowing. Narrowing of the right lateral recess at this level could affect the descending right L4 nerve roots. 2. L4-L5 mild bilateral neural foraminal narrowing. The lateral aspects of the disc bulge may contact the exiting L4 nerve roots. Narrowing of the lateral recesses at this level could affect the descending L5 nerve roots. 3. L5-S1 disc osteophyte complex may contact the exiting L5 nerve roots. Narrowing of the left lateral recess at this level could affect the descending left S1 nerve roots. 4. Osseous fusion across the left greater than right sacroiliac joints.     Electronically Signed   By: Wiliam Ke M.D.   On: 07/16/2023 19:58  I have personally reviewed the images and agree with the above interpretation.  Assessment and Plan: Mr. Dirr is a pleasant 68  y.o. 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  He does not have any current symptoms that are clearly referable to this.  I will repeat his thoracic MRI scan in 6 months.  If that is stable, we will likely stop monitoring.  I do not think his symptoms are  from his back.  They do not follow the dermatome for S1, and his left L4 nerve root is not significantly compressed.  I have recommended that he return to his orthopedist to discuss IT band syndrome.  I will see him back after his MRI scan.  I spent a total of 15 minutes in this patient's care today. This time was spent reviewing pertinent records including imaging studies, obtaining and confirming history, performing a directed evaluation, formulating and discussing my recommendations, and documenting the visit within the medical record.   Thank you for involving me in the care of this patient.      Celester Lech K. Myer Haff MD, [facility] Neurosurgery

## 2023-12-17 ENCOUNTER — Ambulatory Visit: Payer: Medicare HMO | Admitting: Neurosurgery

## 2023-12-19 ENCOUNTER — Other Ambulatory Visit: Payer: Medicare HMO

## 2023-12-22 ENCOUNTER — Ambulatory Visit: Payer: Medicare HMO | Admitting: Podiatry

## 2023-12-30 ENCOUNTER — Other Ambulatory Visit: Payer: Medicare HMO

## 2024-03-30 IMAGING — CR DG CHEST 2V
2 series · 2 of 2 positions shown · non-contrast
Comparison: Chest x-ray 02/03/2022.

CLINICAL DATA: 65-year-old male with history of cough and shortness
of breath.

EXAM:
CHEST - 2 VIEW

[chest pa]
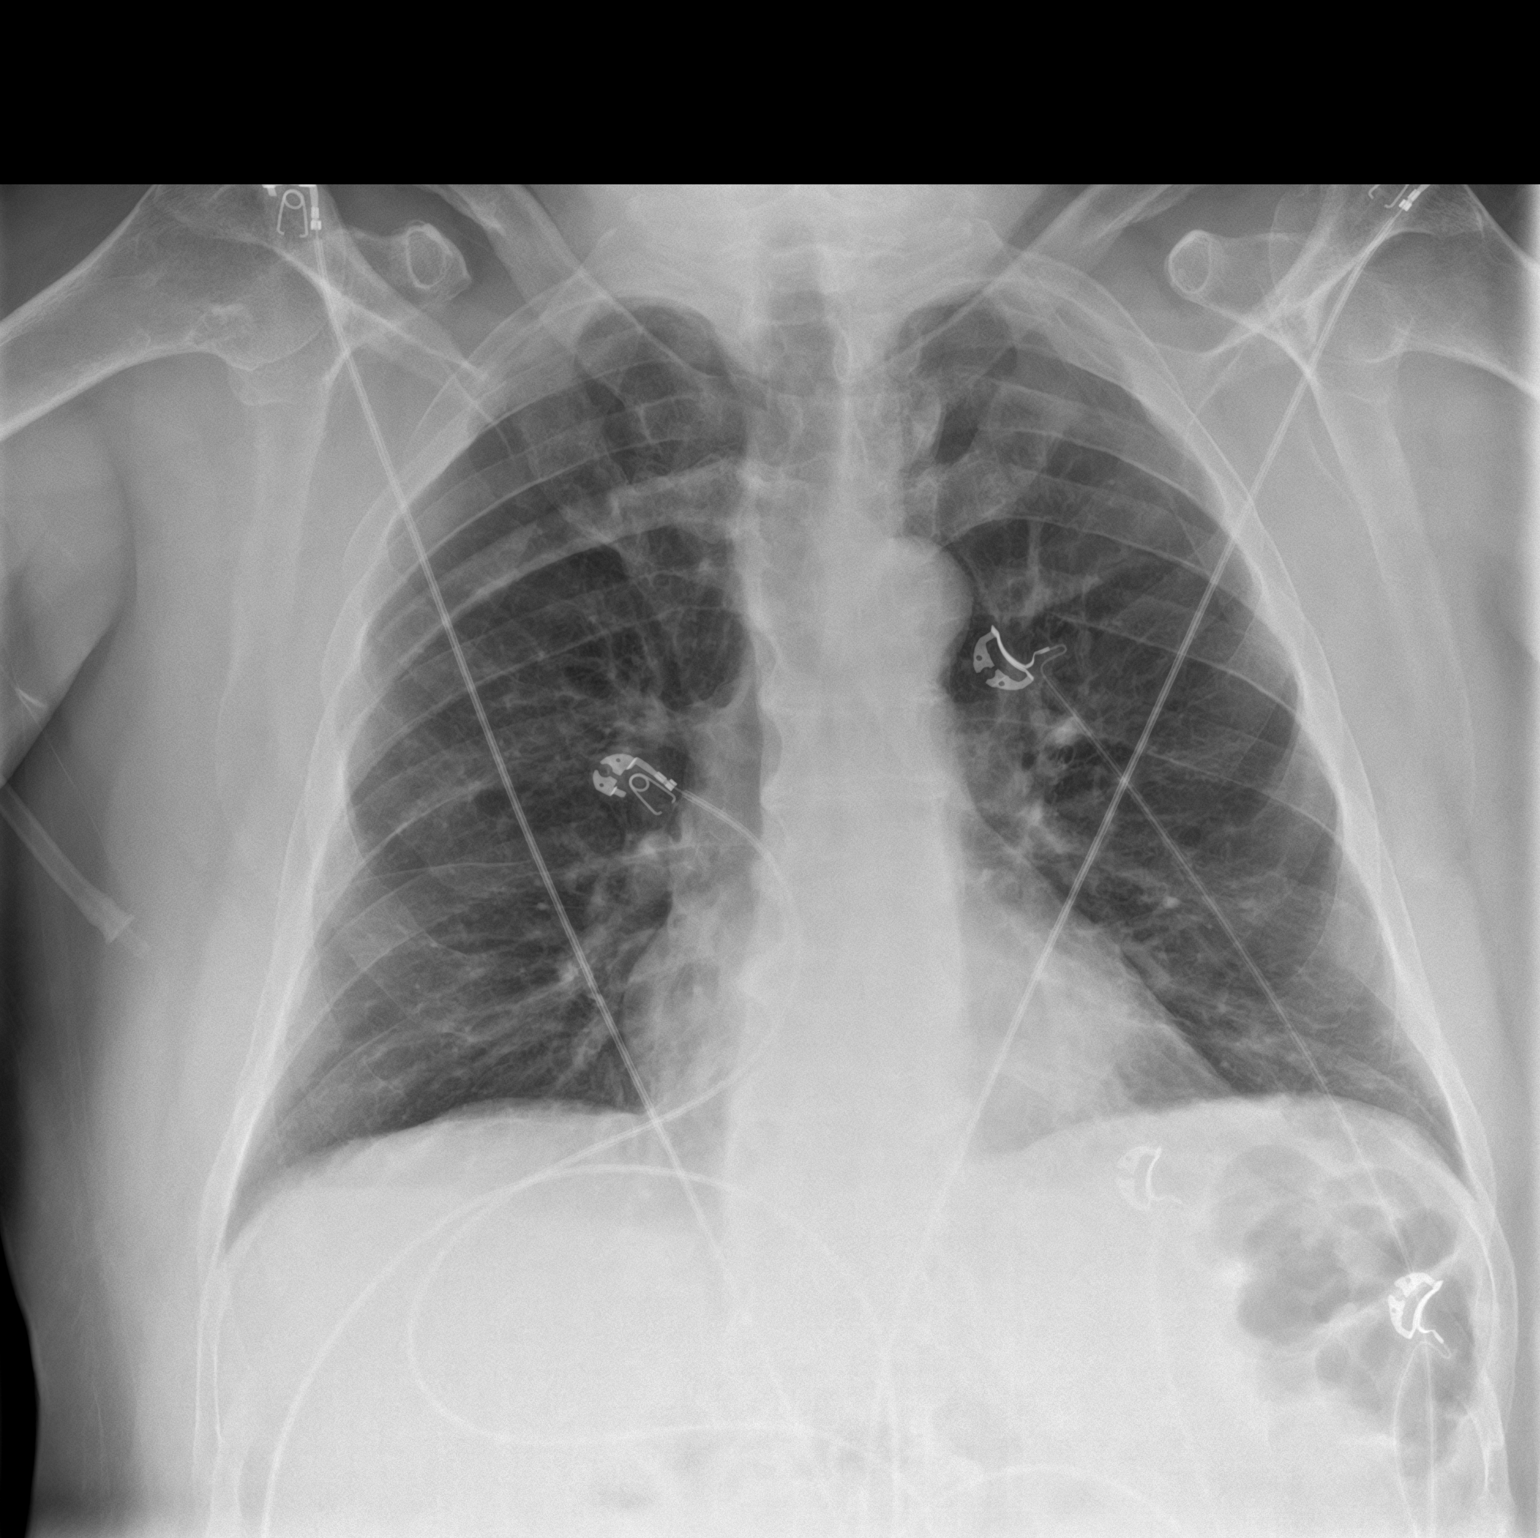

[chest lat]
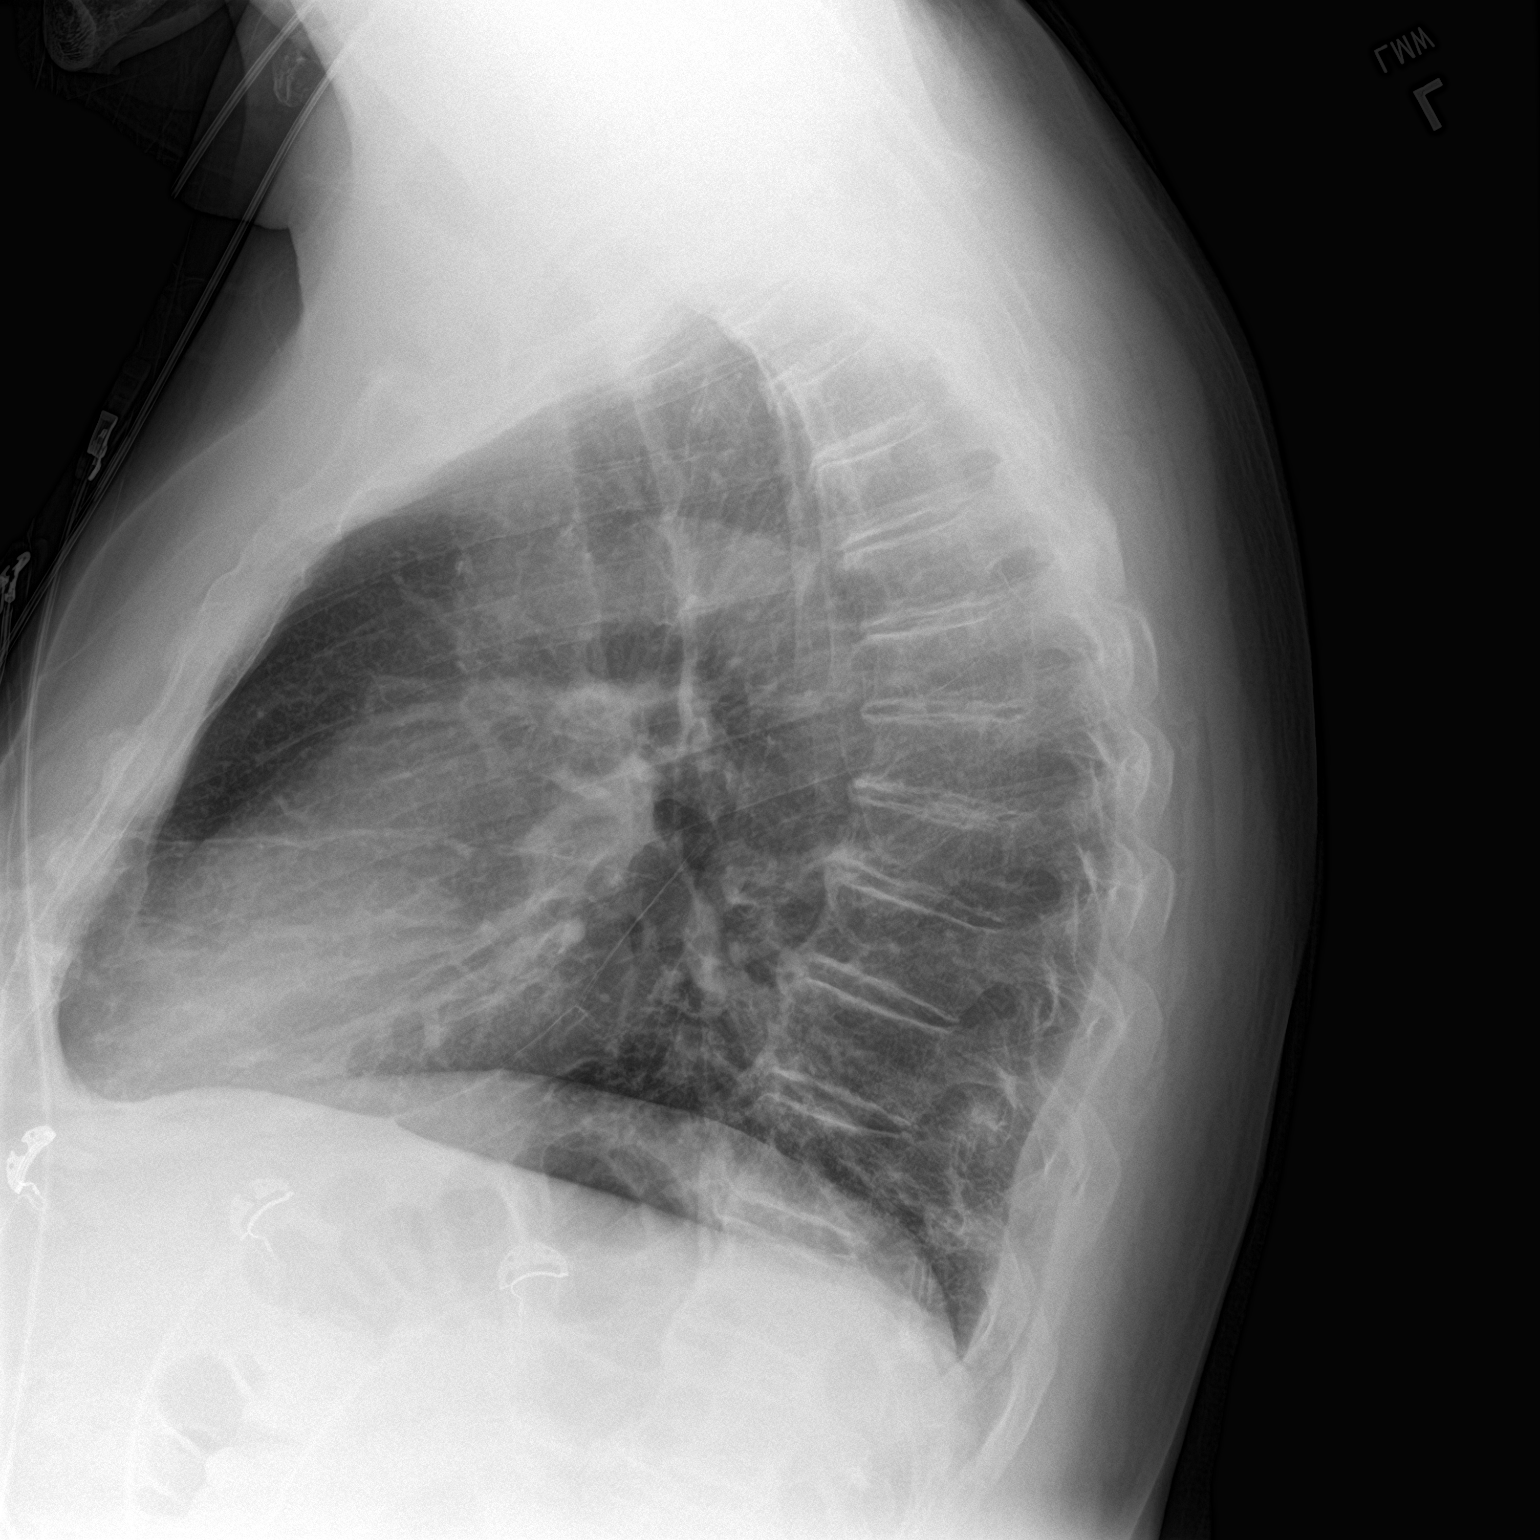

[2 of 2 positions shown; findings below may reference images not displayed]

FINDINGS: Lung volumes are low. No consolidative airspace disease. No pleural
effusions. No pneumothorax. No pulmonary nodule or mass noted.
Pulmonary vasculature and the cardiomediastinal silhouette are
within normal limits.
IMPRESSION: 1. Low lung volumes without radiographic evidence of acute
cardiopulmonary disease.

## 2024-04-09 IMAGING — CT CT ANGIO CHEST
2 of 6 series · 14 of 46 positions shown · IV contrast (APPLIED)
Comparison: Recent CTA chest 02/05/2022, screening chest CT
02/13/2021.

CLINICAL DATA: Right-sided pain with recent right flank injury.



[Series 4: thins · axial · 0.74mm/px · z∈[-790,-566]mm · 11 of 370 slices shown]
[im 33/370  lung]
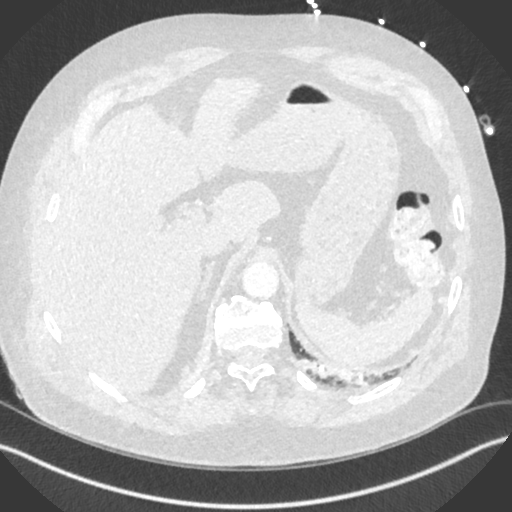
[im 65/370  soft-tissue]
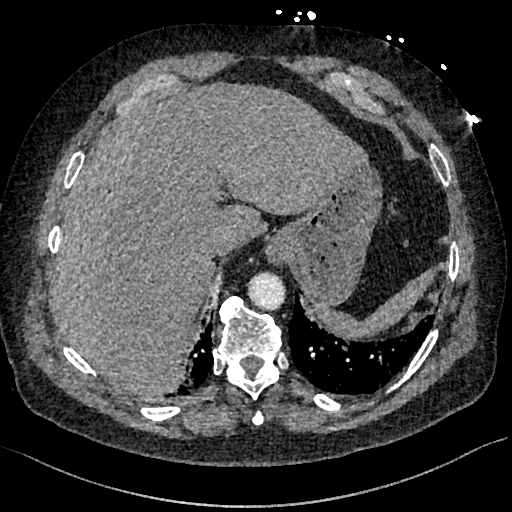
[im 97/370  lung]
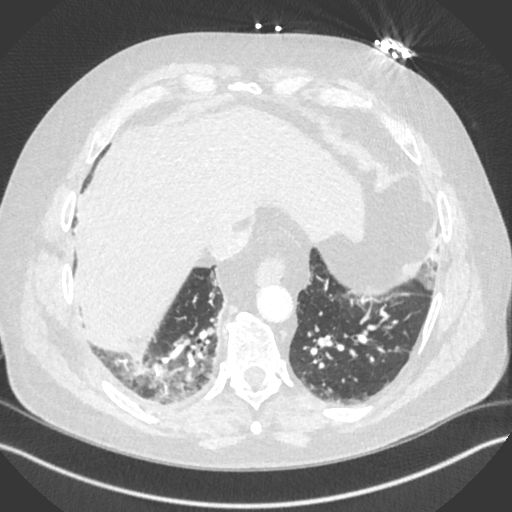
[im 129/370  soft-tissue]
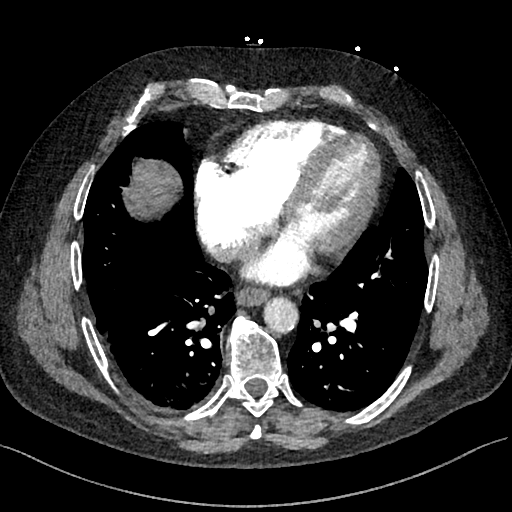
[im 161/370  lung]
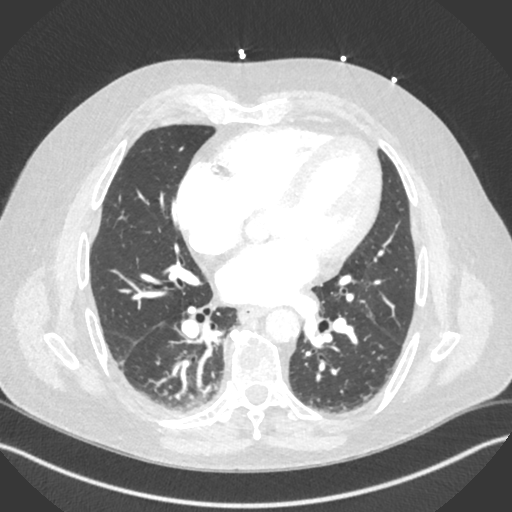
[im 193/370  soft-tissue]
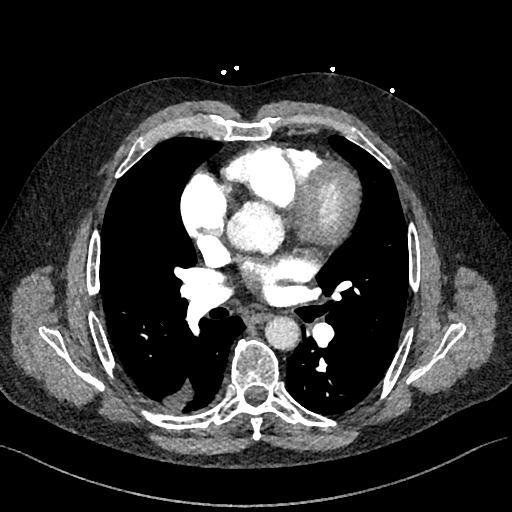
[im 225/370  lung]
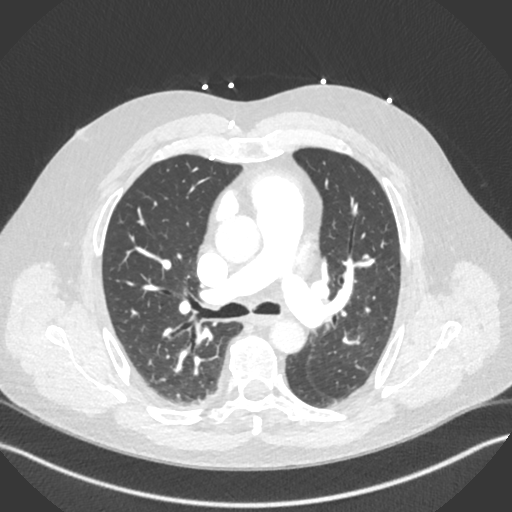
[im 257/370  soft-tissue]
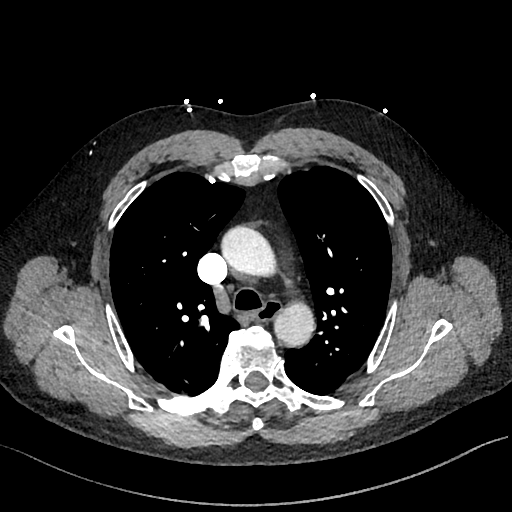
[im 289/370  lung]
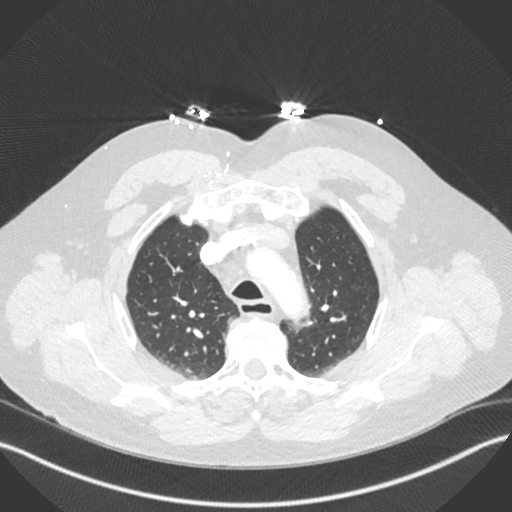
[im 321/370  soft-tissue]
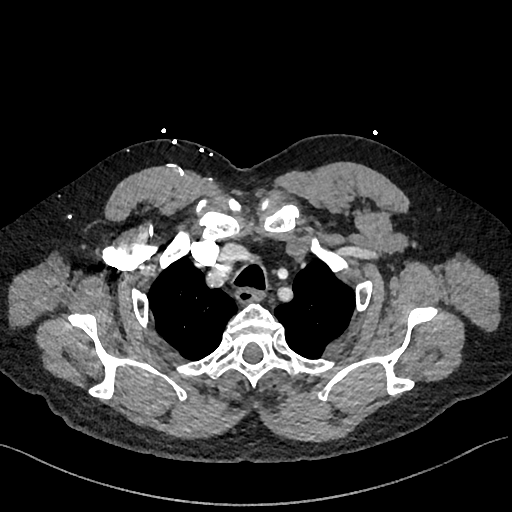
[im 353/370  lung]
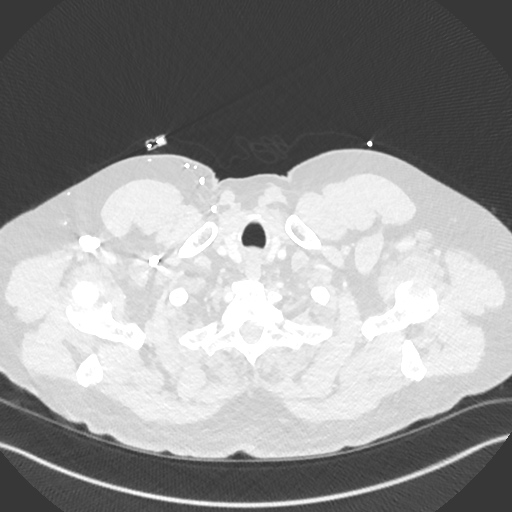

[Series 5: cor · coronal · 0.54mm/px · 3 of 173 slices shown]
[im 44/173  soft-tissue]
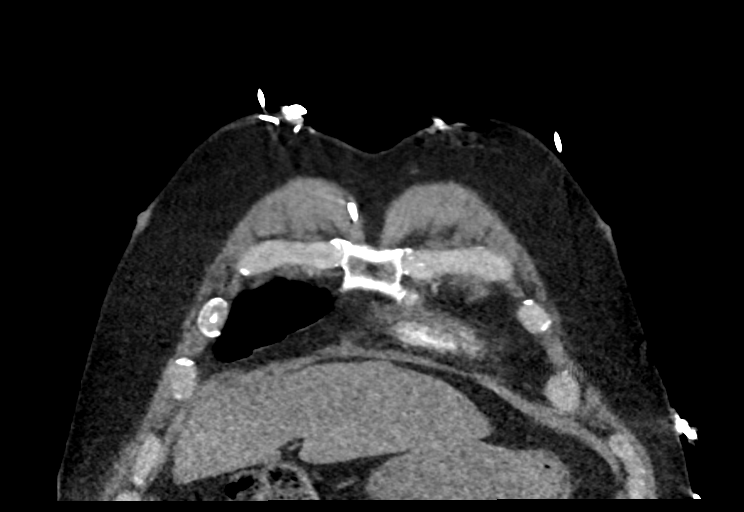
[im 87/173  soft-tissue]
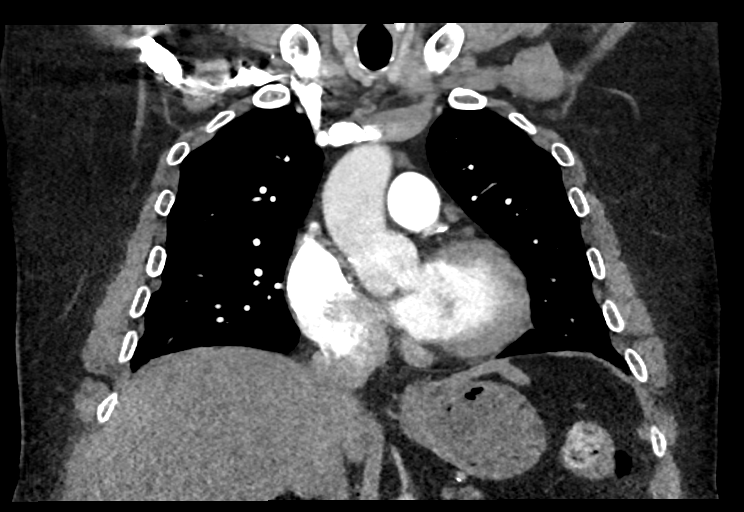
[im 130/173  soft-tissue]
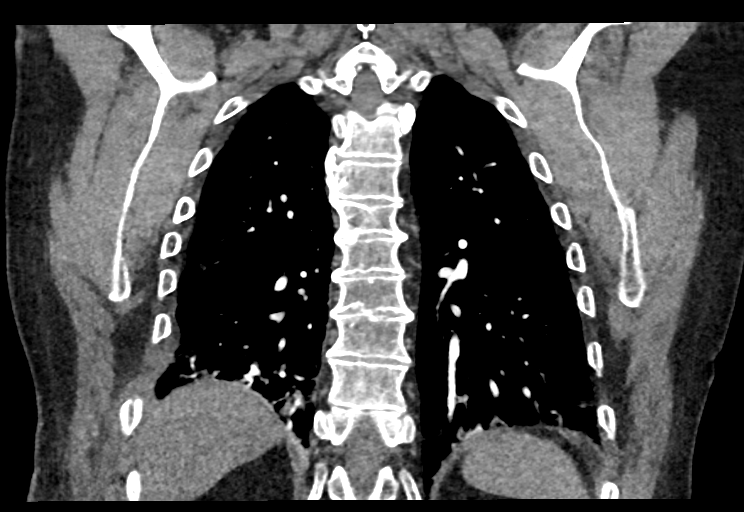

[14 of 46 positions shown; findings below may reference images not displayed]

RADIATION DOSE REDUCTION: This exam was performed according to the
departmental dose-optimization program which includes automated
exposure control, adjustment of the mA and/or kV according to
patient size and/or use of iterative reconstruction technique.

CONTRAST:  100mL OMNIPAQUE IOHEXOL 350 MG/ML SOLN
No prior abdomen and pelvis imaging except hepatic and
renal ultrasounds 05/06/2021 and 01/24/2021.
FINDINGS: CTA CHEST FINDINGS

Cardiovascular: Mild panchamber cardiomegaly again noted without
pericardial effusion, patchy three-vessel calcific CAD,
atherosclerosis in the aorta and great vessels, with no aortic
aneurysm, penetrating ulcer, dissection or great vessel
flow-limiting stenosis.

Pulmonary arteries are upper-normal in caliber without evidence of
embolic filling defects. Pulmonary veins are decompressed.

Mediastinum/Nodes: No enlarged mediastinal, hilar, or axillary lymph
nodes. Thyroid gland and trachea demonstrate no significant
findings. Small hiatal hernia, mostly containing fat is again noted
and mild thickening of the distal thoracic esophagus without
masslike thickening.

Lungs/Pleura: Again noted is a subpleural 1.3 x 0.7 cm nodular
opacity in the posterior basal left lower lobe on [DATE]. There is
asymmetric increased opacity in the posterior basal right lower lobe
which could be atelectasis, pulmonary contusion or developing
pneumonia.

The opacities are slightly denser and more confluent than on the
last CT but the inspiration is lower today. There are atelectatic
bands in the left lower lobe and lingular base.

Rest of the lungs are clear. The central airways are clear. Trace
layering right pleural effusion versus hemothorax has developed
since the prior exams.

Musculoskeletal: There is osteopenia, exaggerated thoracic kyphosis
and mild chronic anterior wedging of the T5, T6, T7, and T8
vertebral bodies with multilevel bridging enthesopathy of the
thoracic spine of [place]APAJ[place].

New since the recent prior study , there is a there is a
nondisplaced fracture noted of the right posterolateral seventh and
eighth ribs, mildly displaced fracture of the posterolateral right
ninth rib and questionable nondisplaced fracture of the right
anterior third rib. There are no other visible thoracic fractures.

Review of the MIP images confirms the above findings.

CT ABDOMEN and PELVIS FINDINGS

Hepatobiliary: The liver is borderline prominent mildly steatotic,
with lobulation along the left hepatic anterior capsular surface
consistent with cirrhosis. Main portal vein slightly prominent at 14
mm. There is no mass enhancement. Gallbladder and bile ducts are
unremarkable.

Pancreas: There is mild fatty infiltration but no mass enhancement
or ductal dilatation, no edema.

Spleen: Normal in size with homogeneous enhancement.

Adrenals/Urinary Tract: There is no adrenal mass. There is a 4.4 cm
parapelvic cyst in the upper right renal pole of 10.8 Hounsfield
units and a 5.4 cm cyst in the right lower pole of 9.2 Hounsfield
units. There is no mass enhancement. Subcentimeter too small to
characterize hypodensities are present in right kidney. There is no
urinary stone or obstruction. Normal bladder thickness.

Stomach/Bowel: No dilatation or wall thickening. An appendix is not
seen in this patient. There is moderate stool retention ascending
and transverse colon with terminal ileum fecal back up. There is
sigmoid diverticulosis without evidence of diverticulitis additional
diverticula descending colon.

Vascular/Lymphatic: Aortic atherosclerosis. No enlarged abdominal or
pelvic lymph nodes.

Reproductive: There is no prostatomegaly. Both testicles are in the
scrotal sac.

Other: There is stranding in the subcutaneous plane lateral lower
right chest wall extending inferiorly to the abdominal wall but no
space-occupying hematoma. There is no incarcerated hernia. Has been
a prior umbilical hernia repair. There is no free air, free
hemorrhage or fluid.

Musculoskeletal: There are bridging osteophytes in the lumbar spine,
osteopenia and degenerative change with facet hypertrophy, foraminal
stenosis L4-5 and L5-S1 with advanced L5-S1 disc collapse and
ankylosis. No regional skeletal fracture is seen. There is ankylosis
across both SI joints, enthesopathy of the pelvis.

Review of the MIP images confirms the above findings.
IMPRESSION: 1. No evidence of pulmonary arterial embolus.
2. Cardiomegaly without evidence of CHF.
3. Aortic and coronary artery atherosclerosis.  No AAA.
4. New finding of nondisplaced right posterolateral seventh and
eighth rib fractures and displaced right posterolateral ninth rib
fracture, trace right pleural effusion or hemothorax without
pneumothorax, and increased opacity in the right lower lobe which
could be atelectasis, contusions or pneumonia.
5. 1.3 x 0.7 cm nodular opacity in the posterior basal left lower
lobe, seen on the most recent exam but not on last year's study.
Agree with prior recommendation for three-month follow-up
noncontrast CT to ensure resolution. Reference: Radiology 4482;
[DATE].
6. Hiatal hernia with mild thickening of the distal thoracic
esophagus. Correlate clinically for reflux esophagitis.
7. There appear to be early cirrhotic changes in the liver with
steatosis, mildly prominent hepatic portal vein. There is no
splenomegaly or ascites.
8. Constipation, with terminal ileal fecal back up. No bowel
obstruction or inflammation. Uncomplicated diverticulosis.
9. Osteopenia, degenerative and DISH changes. No concerning regional
bone lesion.
10. Stranding in the right lateral chest and abdominal wall but no
space-occupying hematoma, no free hemorrhage or free air.

## 2024-04-09 IMAGING — CT CT ABD-PELV W/ CM
2 of 5 series · 13 of 46 positions shown, 15 images · IV contrast (APPLIED)
Comparison: Recent CTA chest 02/05/2022, screening chest CT
02/13/2021.

CLINICAL DATA: Right-sided pain with recent right flank injury.



[Series 4: abdomen 5.0 · axial · 0.86mm/px · z∈[-1171,-721]mm · 10 of 102 slices shown, 12 images]
[im 6/102  soft-tissue]
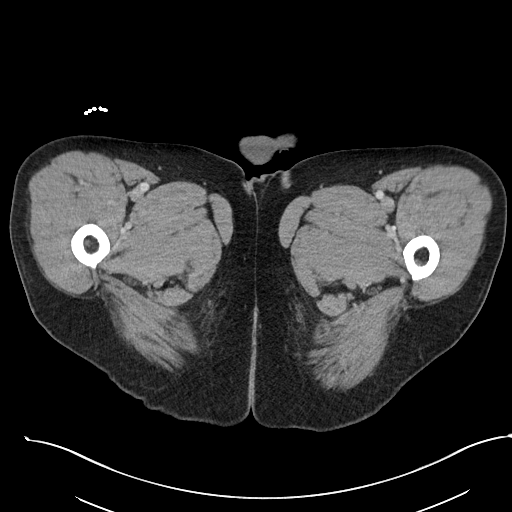
[im 6/102  bone]
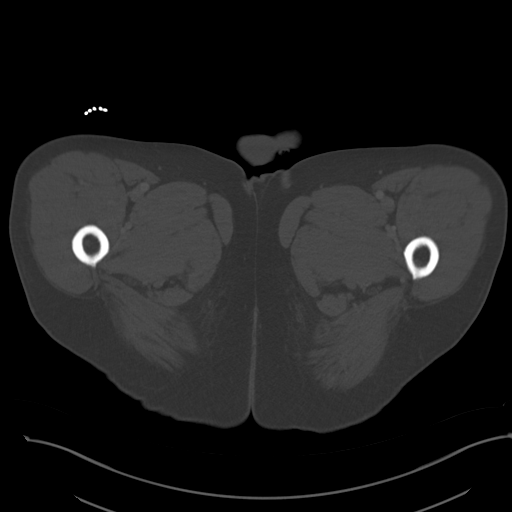
[im 18/102  soft-tissue]
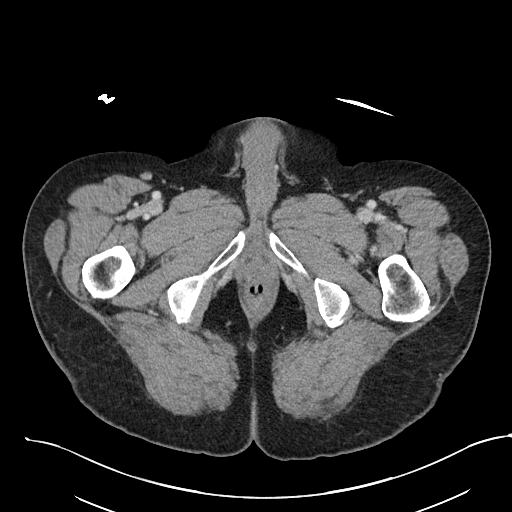
[im 30/102  soft-tissue]
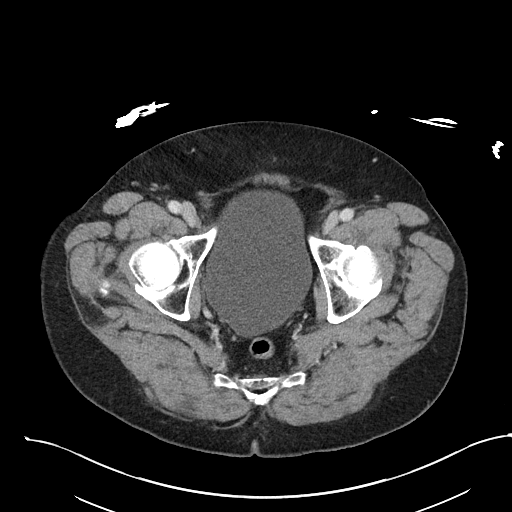
[im 36/102  soft-tissue]
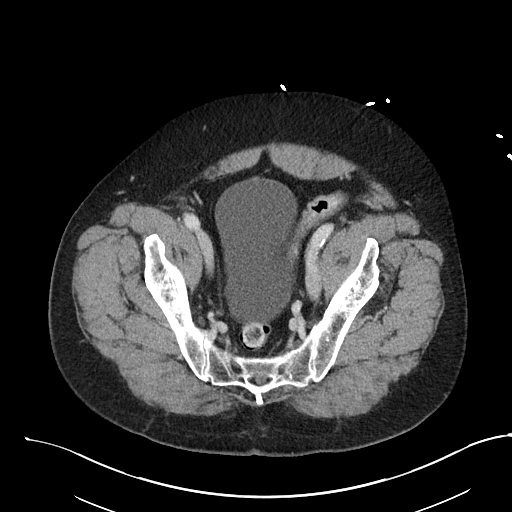
[im 48/102  soft-tissue]
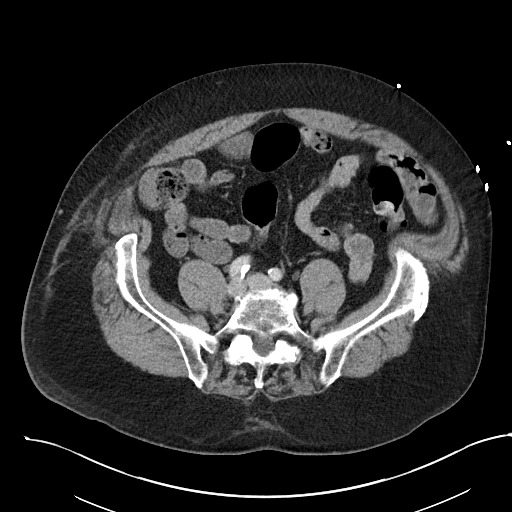
[im 54/102  soft-tissue]
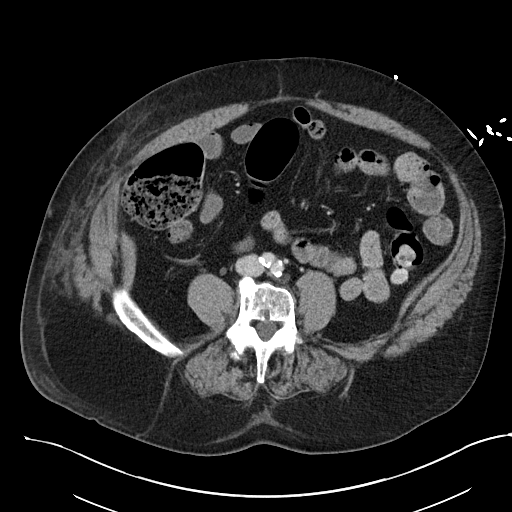
[im 66/102  soft-tissue]
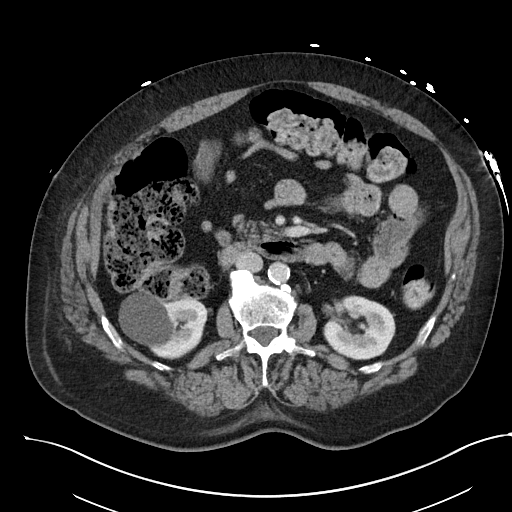
[im 78/102  soft-tissue]
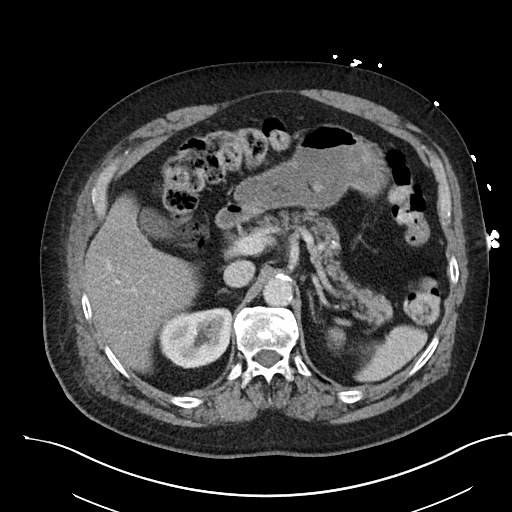
[im 84/102  soft-tissue]
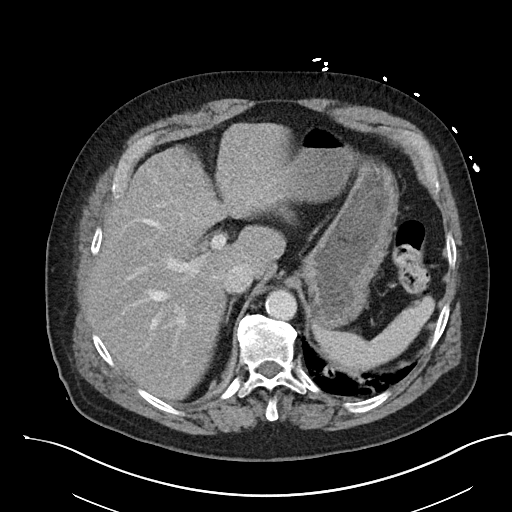
[im 84/102  bone]
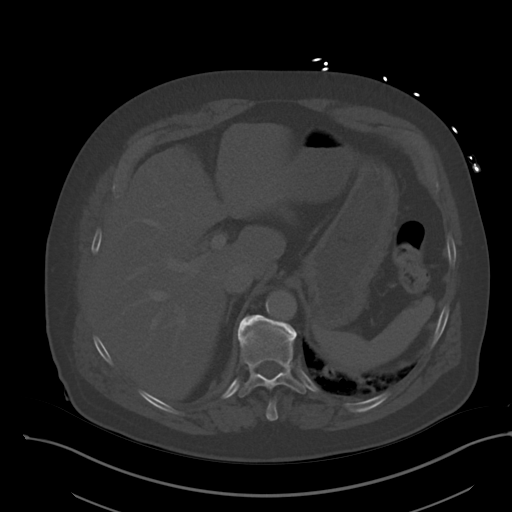
[im 96/102  soft-tissue]
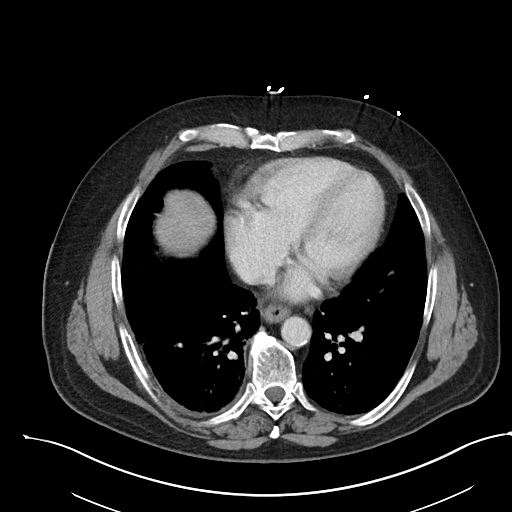

[Series 8: abdomen 3.0 mpr cor · coronal · 0.95mm/px · 3 of 119 slices shown]
[im 40/119  soft-tissue]
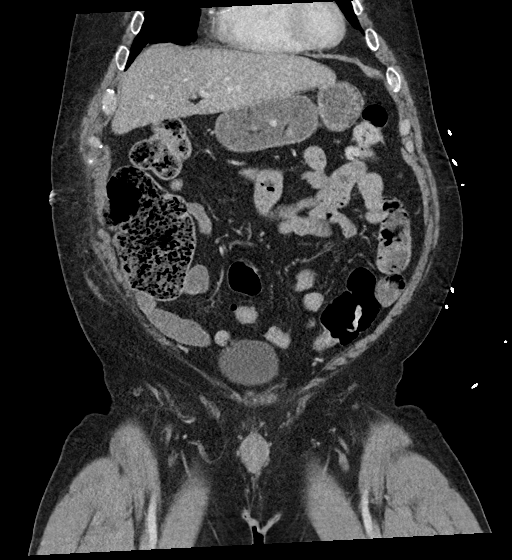
[im 53/119  soft-tissue]
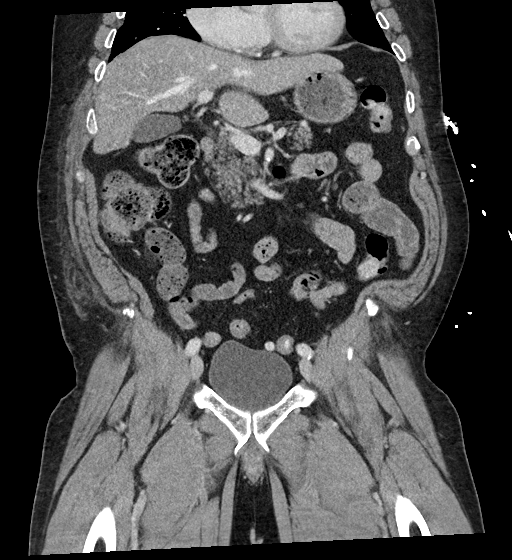
[im 66/119  soft-tissue]
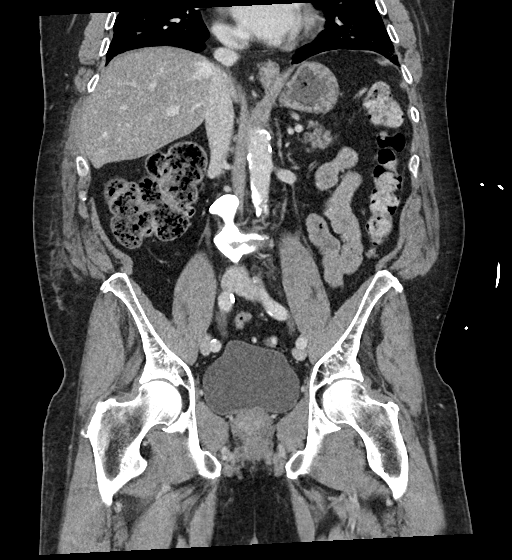

[13 of 46 positions shown; findings below may reference images not displayed]

RADIATION DOSE REDUCTION: This exam was performed according to the
departmental dose-optimization program which includes automated
exposure control, adjustment of the mA and/or kV according to
patient size and/or use of iterative reconstruction technique.

CONTRAST:  100mL OMNIPAQUE IOHEXOL 350 MG/ML SOLN
No prior abdomen and pelvis imaging except hepatic and
renal ultrasounds 05/06/2021 and 01/24/2021.
FINDINGS: CTA CHEST FINDINGS

Cardiovascular: Mild panchamber cardiomegaly again noted without
pericardial effusion, patchy three-vessel calcific CAD,
atherosclerosis in the aorta and great vessels, with no aortic
aneurysm, penetrating ulcer, dissection or great vessel
flow-limiting stenosis.

Pulmonary arteries are upper-normal in caliber without evidence of
embolic filling defects. Pulmonary veins are decompressed.

Mediastinum/Nodes: No enlarged mediastinal, hilar, or axillary lymph
nodes. Thyroid gland and trachea demonstrate no significant
findings. Small hiatal hernia, mostly containing fat is again noted
and mild thickening of the distal thoracic esophagus without
masslike thickening.

Lungs/Pleura: Again noted is a subpleural 1.3 x 0.7 cm nodular
opacity in the posterior basal left lower lobe on [DATE]. There is
asymmetric increased opacity in the posterior basal right lower lobe
which could be atelectasis, pulmonary contusion or developing
pneumonia.

The opacities are slightly denser and more confluent than on the
last CT but the inspiration is lower today. There are atelectatic
bands in the left lower lobe and lingular base.

Rest of the lungs are clear. The central airways are clear. Trace
layering right pleural effusion versus hemothorax has developed
since the prior exams.

Musculoskeletal: There is osteopenia, exaggerated thoracic kyphosis
and mild chronic anterior wedging of the T5, T6, T7, and T8
vertebral bodies with multilevel bridging enthesopathy of the
thoracic spine of [place] RAPAJON.

New since the recent prior study , there is a there is a
nondisplaced fracture noted of the right posterolateral seventh and
eighth ribs, mildly displaced fracture of the posterolateral right
ninth rib and questionable nondisplaced fracture of the right
anterior third rib. There are no other visible thoracic fractures.

Review of the MIP images confirms the above findings.

CT ABDOMEN and PELVIS FINDINGS

Hepatobiliary: The liver is borderline prominent mildly steatotic,
with lobulation along the left hepatic anterior capsular surface
consistent with cirrhosis. Main portal vein slightly prominent at 14
mm. There is no mass enhancement. Gallbladder and bile ducts are
unremarkable.

Pancreas: There is mild fatty infiltration but no mass enhancement
or ductal dilatation, no edema.

Spleen: Normal in size with homogeneous enhancement.

Adrenals/Urinary Tract: There is no adrenal mass. There is a 4.4 cm
parapelvic cyst in the upper right renal pole of 10.8 Hounsfield
units and a 5.4 cm cyst in the right lower pole of 9.2 Hounsfield
units. There is no mass enhancement. Subcentimeter too small to
characterize hypodensities are present in right kidney. There is no
urinary stone or obstruction. Normal bladder thickness.

Stomach/Bowel: No dilatation or wall thickening. An appendix is not
seen in this patient. There is moderate stool retention ascending
and transverse colon with terminal ileum fecal back up. There is
sigmoid diverticulosis without evidence of diverticulitis additional
diverticula descending colon.

Vascular/Lymphatic: Aortic atherosclerosis. No enlarged abdominal or
pelvic lymph nodes.

Reproductive: There is no prostatomegaly. Both testicles are in the
scrotal sac.

Other: There is stranding in the subcutaneous plane lateral lower
right chest wall extending inferiorly to the abdominal wall but no
space-occupying hematoma. There is no incarcerated hernia. Has been
a prior umbilical hernia repair. There is no free air, free
hemorrhage or fluid.

Musculoskeletal: There are bridging osteophytes in the lumbar spine,
osteopenia and degenerative change with facet hypertrophy, foraminal
stenosis L4-5 and L5-S1 with advanced L5-S1 disc collapse and
ankylosis. No regional skeletal fracture is seen. There is ankylosis
across both SI joints, enthesopathy of the pelvis.

Review of the MIP images confirms the above findings.
IMPRESSION: 1. No evidence of pulmonary arterial embolus.
2. Cardiomegaly without evidence of CHF.
3. Aortic and coronary artery atherosclerosis.  No AAA.
4. New finding of nondisplaced right posterolateral seventh and
eighth rib fractures and displaced right posterolateral ninth rib
fracture, trace right pleural effusion or hemothorax without
pneumothorax, and increased opacity in the right lower lobe which
could be atelectasis, contusions or pneumonia.
5. 1.3 x 0.7 cm nodular opacity in the posterior basal left lower
lobe, seen on the most recent exam but not on last year's study.
Agree with prior recommendation for three-month follow-up
noncontrast CT to ensure resolution. Reference: Radiology 4482;
[DATE].
6. Hiatal hernia with mild thickening of the distal thoracic
esophagus. Correlate clinically for reflux esophagitis.
7. There appear to be early cirrhotic changes in the liver with
steatosis, mildly prominent hepatic portal vein. There is no
splenomegaly or ascites.
8. Constipation, with terminal ileal fecal back up. No bowel
obstruction or inflammation. Uncomplicated diverticulosis.
9. Osteopenia, degenerative and DISH changes. No concerning regional
bone lesion.
10. Stranding in the right lateral chest and abdominal wall but no
space-occupying hematoma, no free hemorrhage or free air.

## 2024-04-26 ENCOUNTER — Encounter: Payer: Self-pay | Admitting: Neurosurgery

## 2024-04-28 ENCOUNTER — Encounter: Payer: Self-pay | Admitting: Neurosurgery

## 2024-05-02 ENCOUNTER — Encounter: Payer: Self-pay | Admitting: Neurosurgery

## 2024-05-09 ENCOUNTER — Ambulatory Visit: Admitting: Physical Therapy

## 2024-05-10 ENCOUNTER — Encounter: Payer: Self-pay | Admitting: Neurosurgery

## 2024-05-12 ENCOUNTER — Ambulatory Visit: Admitting: Physical Therapy

## 2024-05-12 ENCOUNTER — Encounter: Payer: Self-pay | Admitting: Neurosurgery

## 2024-05-18 ENCOUNTER — Encounter: Admitting: Physical Therapy

## 2024-05-18 NOTE — Progress Notes (Signed)
 Chief Complaint: Chief Complaint  Patient presents with  . Hip Pain    Left hip bursa injection    History of Present Illness Jesus Mckinney is a 68 year old male with a history of hip bursitis who presents for a bursa injection in the left hip.  He experiences severe pain along the lateral aspect of his left hip, which has worsened significantly over the past year. Previous surgery for bursitis and a ligament tear did not alleviate the pain. A bursa injection in March provided only temporary relief. The pain is severe enough to prevent lying on the affected side and is exacerbated by walking long distances, requiring frequent stops. No arthritis is present, and hip mobility is good.  Past Medical History: Past Medical History:  Diagnosis Date  . Allergy Many years ago  . Anemia   . Anxiety 35 years ago  . Arrhythmia Two years ago  . Arthritis   . Asthma, unspecified asthma severity, unspecified whether complicated, unspecified whether persistent (HHS-HCC)   . Atrial fibrillation (CMS/HHS-HCC)   . Bronchitis, chronic (CMS/HHS-HCC)   . COPD (chronic obstructive pulmonary disease) (CMS/HHS-HCC) 25 yrs ago  . Coronary artery disease involving native coronary artery of native heart 04/17/2022  . Depression 35 years ago  . Diabetes mellitus without complication (CMS/HHS-HCC) Aoc 62  . Essential hypertension 02/16/2022  . GERD (gastroesophageal reflux disease)   . History of pneumonia 02/02/2022  . Hyperlipidemia Two years ago  . Obesity   . Osteoporosis I need to get tested  . Sinusitis, unspecified   . Sleep apnea     Past Surgical History: Past Surgical History:  Procedure Laterality Date  . 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 Colon @ [facility]  03/11/2023   Normal exxamined colon/Poor colon prep/PHx CP/Repeat 79yrs/TKT  . EGD @ [facility]  03/11/2023   Gastritis/Normal KOH prep/No repeat/TKT  . ARTHROPLASTY TOTAL KNEE Left 10/05/2023   by Dr. Lorelle  .  CARDIAC CATHETERIZATION    . FRACTURE SURGERY    . HERNIA REPAIR    . JOINT REPLACEMENT  5 yrs ago   Right knee  . KNEE ARTHROSCOPY      Past Family History: Family History  Problem Relation Age of Onset  . Coronary Artery Disease (Blocked arteries around heart) Mother   . Diabetes type II Mother   . High blood pressure (Hypertension) Mother     Medications: Current Outpatient Medications  Medication Sig Dispense Refill  . acetaminophen  (TYLENOL ) 500 MG tablet Take 1,000 mg by mouth every 8 (eight) hours    . albuterol  (ACCUNEB ) 1.25 mg/3 mL nebulizer solution INHALE 3 MILLILITERS (1 VIAL) VIA NEBULIZER EVERY 6 HOURS AS NEEDED 75 mL 0  . albuterol  MDI, PROVENTIL , VENTOLIN , PROAIR , HFA 90 mcg/actuation inhaler Inhale 2 inhalations into the lungs every 6 (six) hours as needed 1 each 2  . alendronate (FOSAMAX) 70 MG tablet TAKE 1 TABLET BY MOUTH EVERY 7  DAYS WITH A FULL GLASS OF WATER. DO NOT LIE DOWN FOR THE NEXT 30  MINUTES 12 tablet 3  . aspirin  81 MG EC tablet Take 81 mg by mouth once daily (Patient not taking: Reported on 04/07/2024)    . atenoloL  (TENORMIN ) 25 MG tablet Take 0.5 tablets (12.5 mg total) by mouth 2 (two) times daily 90 tablet 3  . atorvastatin  (LIPITOR) 40 MG tablet Take 40 mg by mouth once daily    . blood glucose diagnostic test strip 1 each (  1 strip total) once daily Use as instructed. 100 each 2  . blood glucose meter kit as directed 1 each 0  . budesonide-glycopyrrolate -formoterol  (BREZTRI AEROSPHERE) 160-9-4.8 mcg/actuation inhaler Inhale 2 inhalations into the lungs 2 (two) times daily 32.1 g 3  . dupilumab (DUPIXENT) 300 mg/2 mL pen injector Inject 2 mLs (300 mg total) subcutaneously every 14 (fourteen) days 4 mL 11  . escitalopram  oxalate (LEXAPRO ) 10 MG tablet Take 10 mg by mouth once daily    . ezetimibe  (ZETIA ) 10 mg tablet Take 1 tablet (10 mg total) by mouth once daily 90 tablet 3  . lamoTRIgine  (LAMICTAL ) 200 MG tablet Take 200 mg by mouth once daily     . lancets Use 1 each once daily Use as instructed. 100 each 2  . meloxicam  (MOBIC ) 7.5 MG tablet Take 1 tablet (7.5 mg total) by mouth once daily as needed for Pain (Patient not taking: Reported on 04/07/2024)    . pantoprazole  (PROTONIX ) 40 MG DR tablet Take 1 tablet (40 mg total) by mouth 2 (two) times daily 180 tablet 3   No current facility-administered medications for this visit.    Allergies: Allergies  Allergen Reactions  . Animal Dander Other (See Comments)    Watery itchy eyes, sneezing.   . Bee Pollen Other (See Comments)  . Grass Pollen-June Grass Standard Other (See Comments)    sneezing  . Grass Pollen-Red Top, Standard Cough and Other (See Comments)    Other reaction(s): Cough (ALLERGY/intolerance) sneezing   . House Dust Cough and Other (See Comments)    Other reaction(s): Cough (ALLERGY/intolerance)   . Mite Extract Other (See Comments)    Other reaction(s): Cough (ALLERGY/intolerance)  . Pollen Extracts Other (See Comments)     Review of Systems:  A comprehensive 14 point ROS was performed, reviewed by me today, and the pertinent orthopaedic findings are documented in the HPI.   Exam: BP 130/82   Ht 165.1 cm (5' 5)   Wt 85.3 kg (188 lb)   BMI 31.28 kg/m  General/Constitutional: The patient appears to be well-nourished, well-developed, and in no acute distress. Neuro/Psych: Normal mood and affect, oriented to person, place and time. Eyes: Non-icteric.  Pupils are equal, round, and reactive to light, and exhibit synchronous movement. ENT: Unremarkable. Lymphatic: No palpable adenopathy. Respiratory: Non-labored breathing Cardiovascular: No edema, swelling or tenderness, except as noted in detailed exam. Integumentary: No impressive skin lesions present, except as noted in detailed exam. Musculoskeletal: Unremarkable, except as noted in detailed exam.  Left hip with full range of motion with internal ex rotation with no discomfort.  Point tender along the  trochanteric bursa.  Ambulates well with no antalgia gait no assistive devices.  Left hip x-rays reviewed today show no evidence of significant arthropathy.  No evidence of acute bony abnormality.   Impression: Greater trochanteric bursitis of left hip [M70.62] Greater trochanteric bursitis of left hip  (primary encounter diagnosis)  Plan:  1.  Assessment & Plan Left hip bursitis Chronic left hip pain with previous bursectomy. Persistent severe pain suggests possible tendonitis, tendon tear, or recurrent bursitis. X-rays negative for significant arthritis. Decision for bursa injection based on prior positive response. - Administer bursa injection for pain relief. He understands he will have repeat injections every 3 months as needed.  Discussed possibility of PRP and repeat MRI   Ultrasound guided left Hip Greater Trochanteric Bursa Cortisone Injection Procedure: Consent  After discussing the various treatment options for the condition, It was agreed that a  Greater Trochanter Hip cortisone injection would be the next step in treatment. The nature of and the indications for a corticosteroid and / or local anaesthetic injection were reviewed in detail with the patient today. The inherent risks of injection including infection, allergic reaction, increased pain, incomplete relief or temporary relief of symptoms, alterations of blood glucose levels requiring careful monitoring and treatment as indicated, tendon, ligament or articular cartilage rupture or degeneration, nerve injury, skin depigmentation, and/or fat atrophy were discussed.  Procedure  After the risks and benefits of the procedure were explained, consent was given, and time-out was performed. The site for the injection was properly marked .   The lateral aspect of the left greater trochanter region was prepped with Betadine.  The lateral area was injected with 3 cc's 1% Xylocaine , 3 cc's 0.25% Marcaine , and <1 cc of 40 mg Kenalog.   No complications occurred, as the patient tolerated this well.    The patient may proceed with normal function but otherwise rest the joint for a few more days before resuming regular activities. It may be more painful for the first 1-2 days. Watch for fever, or increased swelling or persistent pain in the joint. Call or return to clinic if such symptoms occur or there is failure to improve as anticipated.  Patient was monitored for 20 minutes after injection.  Patient remained alert, oriented, feeling well with no complaints.  She was stable and ready for discharge home.   This note was generated in part with voice recognition software and I apologize for any typographical errors that were not detected and corrected.   Debby Lonni Amber MPA-C

## 2024-05-23 NOTE — Progress Notes (Signed)
 [facility] PHYSICAL THERAPY EVALUATION    DATE:05/23/2024 Patient's Name:Jesus Mckinney    DOB:1956-09-14 Referring Physician:Aleskerov, Fuad, MD    Medical Diagnosis: Left scapular pain      Treatment Diagnosis: 1. Pain of left scapula   2. Chronic left-sided thoracic back pain   3. Stiffness of joints, multiple sites   4. Muscle weakness (generalized)    Is patient aware of diagnosis/prognosis: [x]  YES      []  NO Subjective:   This 68 year old white male reports to physical therapy a with chronic complaints of left upper back pain.  The patient describes an insidious onset starting approximately 2 months ago.  The pain was appreciated with a deep inhale.  Pain was localized into the left scapular region and anterior chest wall.  The patient felt that the symptoms would improve on their own over time.  The patient has experienced no change in symptoms.  Most recently, the patient followed up with [facility] pulmonology.  The patient was thoroughly evaluated by Dr. Fuad Aleskerov on 04/07/2024.  During the office visit that date, the patient was referred to outpatient physical therapy with complaints of left scapular pain.  The patient is also being seen by Dr. Clois at [facility] neurosurgery.  The patient reports being scheduled for an MRI of the thoracic spine next week.  Past Medical history: Past Medical History:  Diagnosis Date  . Allergy Many years ago  . Anemia   . Anxiety 35 years ago  . Arrhythmia Two years ago  . Arthritis   . Asthma, unspecified asthma severity, unspecified whether complicated, unspecified whether persistent (HHS-HCC)   . Atrial fibrillation (CMS/HHS-HCC)   . Bronchitis, chronic (CMS/HHS-HCC)   . COPD (chronic obstructive pulmonary disease) (CMS/HHS-HCC) 25 yrs ago  . Coronary artery disease involving native coronary artery of native heart 04/17/2022  . Depression 35 years ago  . Diabetes mellitus without complication  (CMS/HHS-HCC) Aoc 62  . Essential hypertension 02/16/2022  . GERD (gastroesophageal reflux disease)   . History of pneumonia 02/02/2022  . Hyperlipidemia Two years ago  . Obesity   . Osteoporosis I need to get tested  . Sinusitis, unspecified   . Sleep apnea    Past Surgical History:  Procedure Laterality Date  . 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 Colon @ [facility]  03/11/2023   Normal exxamined colon/Poor colon prep/PHx CP/Repeat 41yrs/TKT  . EGD @ [facility]  03/11/2023   Gastritis/Normal KOH prep/No repeat/TKT  . ARTHROPLASTY TOTAL KNEE Left 10/05/2023   by Dr. Lorelle  . CARDIAC CATHETERIZATION    . FRACTURE SURGERY    . HERNIA REPAIR    . JOINT REPLACEMENT  5 yrs ago   Right knee  . KNEE ARTHROSCOPY      Social History: The patient is married and retired.  The patient lives with his wife in a one-story home with a level entry.  Prior therapy for same condition:  None for this condition.  Hospitalizations: None for this condition.  Prior level of function: The patient was independent with all home, community and recreational/leisure activities.  Independent with ambulation and driving.  The patient enjoys landscaping and working in his yard.  Current Functional level: The patient's chief complaint is pain into the left upper back and anterior chest wall with deep inhalation.  The patient feels 90% of his discomfort into the upper back and 10% into the anterior chest wall.  Pain scale: Current  resting pain = 3/10.  Pain at its worst with activity = 8/10.  Patient describes the pain as a deep ache with an occasional stabbing pain on the left side.  Aggravating: Deep inhalation.  The patient has mild discomfort reaching fully behind the head and behind the back.  Alleviating: Unknown OBJECTIVE Postural/Observation: Sitting and standing alignment reveals severe protracted scapula bilaterally, increased thoracic kyphosis and a severe forward head  position.  Cervicothoracic AROM: Flexion   48 with little to no effect  Extension  25 with reported tightness  Right Sidebending 17 with reported tightness  Left Sidebending 18 with reported tightness  Right Rotation  35 with reported tightness  Left Rotation  40 with reported tightness    Strength:     Right  Left Cervical SB   5/5  5/5 Shrugs    5/5  5/5 Shoulder Flex   5/5  5/5 Shoulder Abd   5/5  5/5 Shoulder IR   5/5  5/5 Shoulder ER   5/5  5/5 Biceps    5/5  5/5 Triceps    5/5  5/5 Deep neck flexors   4/5 Scapular stabilizers   4/5   Special tests: The patient presents with moderate muscular tightness into the left rhomboid and pectoralis major.  Decreased left scapular mobility is noted with upward rotation, downward rotation, retraction and protraction.  The patient presents with decreased PA mobility T6-T9.  Mild-moderate muscular tightness noted into the bilateral upper trapezius and levator scapula.  Gait: The patient ambulates with increased thoracic kyphosis, rounded shoulders bilaterally and a forward head position.  Palpation: Point tenderness noted along the lower vertebral border of the left scapula.  Patient also point tender over the midline thoracic spine T6-T9 and rib articulation.  ASSESSMENT: The patient is stable and uncomplicated.  A low complexity evaluation was performed this date.  This patient is an excellent candidate for physical therapy in an effort to decrease swelling, increase ROM, increase flexibility, increase strength, improve posture, decrease tenderness, decrease spasms, decrease pain and to return this patient to his prior level of function without pain, discomfort or limitation.  PLAN: Patient's Goals: The patient would like to decrease his pain with deep inhalation.  Long Term Goals: 1.  Decrease pain at its worst with activity to 3/10 or less. 2.  The patient will be independent in a thorough HEP to improve ROM, strength,  posture and function in an effort to return to his prior level of function. 3.  Increase AROM of the cervicothoracic spine to flexion = 50, extension = 50, SB right and left = 45 and rotation right and left = 70 or greater throughout all planes of motion. 4.  Increase gross strength of the deep neck flexors and scapular stabilizers to 5/5 throughout. 5.  The patient will demonstrate improved cervical and thoracic spine posture with sitting activities to allow for improved postural alignment with activities such as driving, reading and working at the computer. 6.  The patient will be able to turn the head fully to the right and left without increased trunk rotation to allow for increased tolerance to turning the head with activities such as driving. 7.  The patient will be able to tolerate full deep inhalation without complaints of increased scapula or upper back pain.   Treatment Plan: The patient will receive modalities as needed including moist heat, ultrasound, infrared, electrical stimulation, mechanical traction and cold packs.  Therapeutic exercise and therapeutic activities will be performed to increase ROM,  strength, flexibility, posture, ergonomics, balance, proprioception and function.  Manual therapy will be performed to increase ROM, strength, mobility and flexibility as well as decrease tension, spasms and tissue texture.  Neuromuscular reeducation and activities of daily living training will be performed to increase strength, stabilization, posture, ergonomics, balance, proprioception and function.  The patient may receive gait training to normailize the gait cycle.  In addition, the patient will participate in a progressive home exercise program to complement the formal physical therapy sessions.  Frequency: 2 per week for 12 weeks. Precautions:  DM, asthma, COPD, CAD, HTN and A-fib  Neck Treatment Log     Date  05/23/24           UBE            Shoulder Retraction             Shoulder Extension            Shoulder Shrugs            Standing Bilateral ER            Standing Horiz. Abd            Lat Pull Downs            F. M. Rows            Cervical Retraction            Doorway (Pect) Stretch            Stretch- Upper Trap/ [place] Beery            HEP: The patient was educated regarding use of a Theracane and massage gun at home to target soft tissue mobilization along the vertebral border of the scapula and left thoracic paravertebral musculature                                                                                                      Manual: Left scapular mobilizations PA bilateral and left unilateral glides thoracic spine T5-T10 15'           Cervical Traction- mech/manual            Ultrasound/ Infrared            HP/CP  with/without IFC              PT Billing Documentation PT Evaluation or progress note completed today: Yes Date of Onset: 04/07/24 Visit Number: 1  Evaluation and Re-Evaluation Codes PT Eval LOW Complexity CPT 97161: 1    Frequently Used Timed Codes Manual Therapy CPT 97140: 15 minutes                 Total Treatment Time: 50 minutes  This note was generated in part with voice recognition software and I apologize for any typographical errors that were not detected and corrected. If the patient does not return for follow up visit(s) related to this episode of care, this note will serve as their discharge note from physical therapy.    Therapist:Bradley JULIANNA Sous, PT  Date:05/23/2024

## 2024-05-24 ENCOUNTER — Emergency Department

## 2024-05-24 ENCOUNTER — Encounter: Payer: Self-pay | Admitting: Emergency Medicine

## 2024-05-24 ENCOUNTER — Emergency Department
Admission: EM | Admit: 2024-05-24 | Discharge: 2024-05-24 | Disposition: A | Discharge status: Home / Self Care | Attending: Emergency Medicine | Admitting: Emergency Medicine

## 2024-05-24 ENCOUNTER — Other Ambulatory Visit: Payer: Self-pay

## 2024-05-24 DIAGNOSIS — M25561 Pain in right knee: Secondary | ICD-10-CM

## 2024-05-24 DIAGNOSIS — Z96659 Presence of unspecified artificial knee joint: Secondary | ICD-10-CM | POA: Insufficient documentation

## 2024-05-24 DIAGNOSIS — S83101A Unspecified subluxation of right knee, initial encounter: Secondary | ICD-10-CM | POA: Insufficient documentation

## 2024-05-24 DIAGNOSIS — J449 Chronic obstructive pulmonary disease, unspecified: Secondary | ICD-10-CM | POA: Diagnosis not present

## 2024-05-24 DIAGNOSIS — X509XXA Other and unspecified overexertion or strenuous movements or postures, initial encounter: Secondary | ICD-10-CM | POA: Diagnosis not present

## 2024-05-24 DIAGNOSIS — T148XXA Other injury of unspecified body region, initial encounter: Secondary | ICD-10-CM

## 2024-05-24 LAB — CBC WITH DIFFERENTIAL/PLATELET
Abs Immature Granulocytes: 0.04 K/uL (ref 0.00–0.07)
Basophils Absolute: 0 K/uL (ref 0.0–0.1)
Basophils Relative: 0 %
Eosinophils Absolute: 0 K/uL (ref 0.0–0.5)
Eosinophils Relative: 0 %
HCT: 46.1 % (ref 39.0–52.0)
Hemoglobin: 15 g/dL (ref 13.0–17.0)
Immature Granulocytes: 1 %
Lymphocytes Relative: 14 %
Lymphs Abs: 1.1 K/uL (ref 0.7–4.0)
MCH: 31.7 pg (ref 26.0–34.0)
MCHC: 32.5 g/dL (ref 30.0–36.0)
MCV: 97.5 fL (ref 80.0–100.0)
Monocytes Absolute: 0.7 K/uL (ref 0.1–1.0)
Monocytes Relative: 9 %
Neutro Abs: 6.1 K/uL (ref 1.7–7.7)
Neutrophils Relative %: 76 %
Platelets: 287 K/uL (ref 150–400)
RBC: 4.73 MIL/uL (ref 4.22–5.81)
RDW: 12.8 % (ref 11.5–15.5)
WBC: 7.9 K/uL (ref 4.0–10.5)
nRBC: 0 % (ref 0.0–0.2)

## 2024-05-24 LAB — BASIC METABOLIC PANEL WITH GFR
Anion gap: 7 (ref 5–15)
BUN: 21 mg/dL (ref 8–23)
CO2: 22 mmol/L (ref 22–32)
Calcium: 8.9 mg/dL (ref 8.9–10.3)
Chloride: 104 mmol/L (ref 98–111)
Creatinine, Ser: 0.97 mg/dL (ref 0.61–1.24)
GFR, Estimated: >60 mL/min (ref >60)
Glucose, Bld: 102 mg/dL — ABNORMAL HIGH (ref 70–99)
Potassium: 3.9 mmol/L (ref 3.5–5.1)
Sodium: 133 mmol/L — ABNORMAL LOW (ref 135–145)

## 2024-05-24 MED ORDER — IOHEXOL 350 MG/ML SOLN
100.0000 mL | Freq: Once | INTRAVENOUS | Status: AC | PRN
  Administered 2024-05-24: 100 mL via INTRAVENOUS

## 2024-05-24 MED ORDER — HYDROCODONE-ACETAMINOPHEN 5-325 MG PO TABS
1.0000 | ORAL_TABLET | ORAL | 0 refills | Status: DC | PRN
Start: 2024-05-24 — End: 2024-09-14

## 2024-05-24 NOTE — ED Triage Notes (Signed)
 Patient to ED via POV for right knee pain. PT reports knee replacement to same knee. States during the night it started feeling like it was coming out of place. Using walker today due to same.

## 2024-05-24 NOTE — Discharge Instructions (Addendum)
 Please use the knee immobilizer at all times.  Please make sure to call Dr. Mal office to set up a follow-up appointment for further management of your right knee symptoms.

## 2024-05-24 NOTE — ED Provider Notes (Signed)
-----------------------------------------   4:06 PM on 05/24/2024 ----------------------------------------- CT shows no significant finding.  We will discharge with a knee immobilizer crutches of weightbearing as tolerated and follow-up with Dr. Lorelle Dorothyann Drivers, MD 05/24/24 1606

## 2024-05-24 NOTE — ED Provider Notes (Signed)
 [name] Provider Note    Event Date/Time   First MD Initiated Contact with Patient 05/24/24 1312     (approximate)   History   Knee Pain   HPI  Jesus Mckinney is a 68 y.o. male with history of prior knee replacement, hyperlipidemia, COPD, presenting with right knee pain.  Patient states that yesterday night, his knee was bent, he thought that it was dislocated laterally since he noticed a bulge to his right lateral knee, was unable to extend his knee, spontaneously reduced, states that it happened again this morning and self reduced.  He denies any weakness or numbness, no leg discoloration.  No trauma or falls.  States that the right knee was replaced 7 or 8 years ago, states that his left knee was just replaced last year with Dr. Lorelle.  On independent review, he was last seen by orthopedic surgery on July 23, has history of hip bursitis status post bursa injection.  He does not have any left lower extremity complaints.     Physical Exam   Triage Vital Signs: ED Triage Vitals  Encounter Vitals Group     BP 05/24/24 1218 (!) 155/136     Girls Systolic BP Percentile --      Girls Diastolic BP Percentile --      Boys Systolic BP Percentile --      Boys Diastolic BP Percentile --      Pulse Rate 05/24/24 1218 75     Resp 05/24/24 1218 17     Temp 05/24/24 1218 98.5 F (36.9 C)     Temp Source 05/24/24 1218 Oral     SpO2 05/24/24 1218 97 %     Weight 05/24/24 1217 183 lb (83 kg)     Height 05/24/24 1217 5' 5 (1.651 m)     Head Circumference --      Peak Flow --      Pain Score 05/24/24 1217 8     Pain Loc --      Pain Education --      Exclude from Growth Chart --     Most recent vital signs: Vitals:   05/24/24 1218  BP: (!) 155/136  Pulse: 75  Resp: 17  Temp: 98.5 F (36.9 C)  SpO2: 97%     General: Awake, no distress.  CV:  Good peripheral perfusion.  Resp:  Normal effort.  Abd:  No distention.  Other:  Able to fully  range the right lower extremity, he has some laxity with varus stress, no palpable swelling or tenderness along his right lower extremity, no focal weakness or numbness, he has equal DP and PT pulses bilaterally.  No discoloration to his right lower extremity.  No obvious deformity.   ED Results / Procedures / Treatments   Labs (all labs ordered are listed, but only abnormal results are displayed) Labs Reviewed  BASIC METABOLIC PANEL WITH GFR - Abnormal; Notable for the following components:      Result Value   Sodium 133 (*)    Glucose, Bld 102 (*)    All other components within normal limits  CBC WITH DIFFERENTIAL/PLATELET     RADIOLOGY On my independent interpretation, x-ray without obvious fracture or dislocation   PROCEDURES:  Critical Care performed: No  Procedures   MEDICATIONS ORDERED IN ED: Medications  iohexol  (OMNIPAQUE ) 350 MG/ML injection 100 mL (100 mLs Intravenous Contrast Given 05/24/24 1455)     IMPRESSION / MDM / ASSESSMENT AND  PLAN / ED COURSE  I reviewed the triage vital signs and the nursing notes.                              Differential diagnosis includes, but is not limited to, subluxation,?  Dislocation, hardware malfunction, considered vascular injury but patient has palpable DP pulses.  Will still get labs, CT angio, will consult orthopedic surgery.  X-ray was done out of triage.  Patient's presentation is most consistent with acute presentation with potential threat to life or bodily function.  Independent interpretation of labs and imaging below.  Consulted orthopedic surgery who felt a dislocation is unlikely given that the hardware would not self reduce.  They suspect his knee might be subluxing.  Says that if CT angio is negative, able to be followed by outpatient, and discharged with knee immobilizer.  Shared decision making done with patient about this plan and he is agreeable.  Patient signed out to oncoming team pending CT imaging  results.  If negative able to discharge with orthopedic follow-up.    Clinical Course as of 05/24/24 1506  Tue May 24, 2024  1325 DG Knee Complete 4 Views Right IMPRESSION: 1. No acute fracture or dislocation. 2. Total right knee arthroplasty.   [TT]  1351 Consulted orthopedic surgery, states is highly unlikely for total knee replacement to dislocate like that since it would not spontaneously relocate, he suspects patient's knee is subluxing.  Says it is okay to do the CT angio, if negative, able to be discharged with outpatient follow-up with a knee immobilizer.  If patient has any is in the subluxing, he will likely need a revision. [TT]  1457 Independent review of labs, no leukocytosis, mild hyponatremia, otherwise electrolytes not severely deranged, creatinine is normal. [TT]    Clinical Course User Index [TT] Way[place], Lorelle Cummins, MD     FINAL CLINICAL IMPRESSION(S) / ED DIAGNOSES   Final diagnoses:  Acute pain of right knee  Subluxation     Rx / DC Orders   ED Discharge Orders     None        Note:  This document was prepared using Dragon voice recognition software and may include unintentional dictation errors.    [place] Lorelle Cummins, MD 05/24/24 [phone]

## 2024-05-26 ENCOUNTER — Encounter: Admitting: Physical Therapy

## 2024-05-30 ENCOUNTER — Other Ambulatory Visit: Payer: Medicare HMO

## 2024-05-30 ENCOUNTER — Ambulatory Visit
Admission: RE | Admit: 2024-05-30 | Discharge: 2024-05-30 | Disposition: A | Discharge status: Home / Self Care | Payer: Medicare HMO | Source: Ambulatory Visit | Attending: Neurosurgery | Admitting: Neurosurgery

## 2024-05-30 DIAGNOSIS — G96198 Other disorders of meninges, not elsewhere classified: Secondary | ICD-10-CM

## 2024-05-31 ENCOUNTER — Encounter: Admitting: Physical Therapy

## 2024-06-02 ENCOUNTER — Encounter: Admitting: Physical Therapy

## 2024-06-07 ENCOUNTER — Encounter: Admitting: Physical Therapy

## 2024-06-08 ENCOUNTER — Encounter: Payer: Self-pay | Admitting: Neurosurgery

## 2024-06-09 ENCOUNTER — Encounter: Admitting: Physical Therapy

## 2024-06-09 ENCOUNTER — Other Ambulatory Visit: Payer: Self-pay | Admitting: Pulmonary Disease

## 2024-06-09 DIAGNOSIS — R911 Solitary pulmonary nodule: Secondary | ICD-10-CM

## 2024-06-09 DIAGNOSIS — J4489 Other specified chronic obstructive pulmonary disease: Secondary | ICD-10-CM

## 2024-06-13 ENCOUNTER — Encounter: Admitting: Physical Therapy

## 2024-06-15 ENCOUNTER — Encounter: Admitting: Physical Therapy

## 2024-06-20 ENCOUNTER — Encounter: Admitting: Physical Therapy

## 2024-06-23 ENCOUNTER — Encounter: Admitting: Physical Therapy

## 2024-07-15 ENCOUNTER — Ambulatory Visit
Admission: RE | Admit: 2024-07-15 | Discharge: 2024-07-15 | Disposition: A | Discharge status: Home / Self Care | Source: Ambulatory Visit | Attending: Pulmonary Disease | Admitting: Pulmonary Disease

## 2024-07-15 DIAGNOSIS — J4489 Other specified chronic obstructive pulmonary disease: Secondary | ICD-10-CM | POA: Insufficient documentation

## 2024-07-15 DIAGNOSIS — R911 Solitary pulmonary nodule: Secondary | ICD-10-CM | POA: Insufficient documentation

## 2024-08-23 ENCOUNTER — Telehealth: Payer: Self-pay

## 2024-08-23 NOTE — Telephone Encounter (Signed)
 Called patient to advise he schedule an appointment with his PCP regarding his headaches post fall.

## 2024-08-23 NOTE — Telephone Encounter (Addendum)
 Called patient to follow up on an after hours service call regarding patient fall out of bed.  Patient states that he fell a few weeks ago off his bed and has been having a headache ever since. He states that the headache is positional and usually only hurts when he looks up or to the right. He also says he can not lay on it because it hurts. Unable to describe the headache other than it hurts on the whole right side of his head. 6/10 pain.   While he endorses limited motion in his neck, he says this is not a change since the fall and has been going on since before he fell. The only change he has noted since his fall is the positional headaches.   He states that he had a herniated disc in his neck and his posture is not good. He says that his head is positioned far forward. States they took an MRI a year or two ago.    Pain regimen: Norco - helped with headache (5-6 over the past couple weeks for headache)  Tylenol  - has not tried it  Patient asking if he needs to see orthopedic surgeon or go directly to Dr. Clois.

## 2024-08-23 NOTE — Telephone Encounter (Signed)
 I agree with having him follow up with PCP for his headaches.

## 2024-08-26 ENCOUNTER — Other Ambulatory Visit: Payer: Self-pay | Admitting: Internal Medicine

## 2024-08-26 DIAGNOSIS — I10 Essential (primary) hypertension: Secondary | ICD-10-CM

## 2024-08-26 DIAGNOSIS — G4733 Obstructive sleep apnea (adult) (pediatric): Secondary | ICD-10-CM

## 2024-08-26 DIAGNOSIS — I471 Supraventricular tachycardia, unspecified: Secondary | ICD-10-CM

## 2024-08-26 DIAGNOSIS — R9431 Abnormal electrocardiogram [ECG] [EKG]: Secondary | ICD-10-CM

## 2024-08-26 DIAGNOSIS — E782 Mixed hyperlipidemia: Secondary | ICD-10-CM

## 2024-09-12 ENCOUNTER — Encounter (HOSPITAL_COMMUNITY): Payer: Self-pay

## 2024-09-14 ENCOUNTER — Emergency Department

## 2024-09-14 ENCOUNTER — Other Ambulatory Visit: Payer: Self-pay

## 2024-09-14 ENCOUNTER — Emergency Department
Admission: EM | Admit: 2024-09-14 | Discharge: 2024-09-14 | Disposition: A | Discharge status: Home / Self Care | Attending: Emergency Medicine | Admitting: Emergency Medicine

## 2024-09-14 DIAGNOSIS — S46912A Strain of unspecified muscle, fascia and tendon at shoulder and upper arm level, left arm, initial encounter: Secondary | ICD-10-CM | POA: Diagnosis not present

## 2024-09-14 DIAGNOSIS — W010XXA Fall on same level from slipping, tripping and stumbling without subsequent striking against object, initial encounter: Secondary | ICD-10-CM | POA: Diagnosis not present

## 2024-09-14 DIAGNOSIS — S0990XA Unspecified injury of head, initial encounter: Secondary | ICD-10-CM | POA: Insufficient documentation

## 2024-09-14 DIAGNOSIS — I10 Essential (primary) hypertension: Secondary | ICD-10-CM | POA: Insufficient documentation

## 2024-09-14 DIAGNOSIS — Y9389 Activity, other specified: Secondary | ICD-10-CM | POA: Insufficient documentation

## 2024-09-14 DIAGNOSIS — J449 Chronic obstructive pulmonary disease, unspecified: Secondary | ICD-10-CM | POA: Insufficient documentation

## 2024-09-14 DIAGNOSIS — Y92007 Garden or yard of unspecified non-institutional (private) residence as the place of occurrence of the external cause: Secondary | ICD-10-CM | POA: Diagnosis not present

## 2024-09-14 DIAGNOSIS — S298XXA Other specified injuries of thorax, initial encounter: Secondary | ICD-10-CM

## 2024-09-14 DIAGNOSIS — S20212A Contusion of left front wall of thorax, initial encounter: Secondary | ICD-10-CM | POA: Diagnosis not present

## 2024-09-14 MED ORDER — NAPROXEN 500 MG PO TABS: 500.0000 mg | ORAL_TABLET | Freq: Two times a day (BID) | ORAL | 0 refills | Status: AC

## 2024-09-14 MED ORDER — OXYCODONE HCL 5 MG PO TABS
5.0000 mg | ORAL_TABLET | Freq: Once | ORAL | Status: AC
  Administered 2024-09-14: 5 mg via ORAL
  Filled 2024-09-14: qty 1

## 2024-09-14 MED ORDER — OXYCODONE HCL 5 MG PO TABS: 5.0000 mg | ORAL_TABLET | Freq: Three times a day (TID) | ORAL | 0 refills | Status: DC | PRN

## 2024-09-14 NOTE — ED Triage Notes (Signed)
 Patient states last night he was doing yard work when he tripped over a rock landing backwards; complaining of pain to left shoulder and left rib cage.

## 2024-09-14 NOTE — ED Provider Notes (Signed)
 Shared visit   Presents to the emergency department following a fall yesterday.  Pain to the left shoulder and having decreased ability to actively range his left shoulder.  Able to passively range his left shoulder.  X-ray imaging with no acute fracture.  I have very low suspicion for ligamentous injury and do not feel the MRI of the neck is necessary.  Has good peripheral pulses and sensation and grip strength with flexion and extension of the elbow intact.  Concern for rotator cuff injury or nerve impingement.  Discussed close follow-up as an outpatient with orthopedics.  Patient called to schedule follow-up appointment while in the emergency department.  Discussed sling with early range of motion exercises.  Discussed return precautions for any ongoing or worsening symptoms.   Suzanne Kirsch, MD 09/14/24 1139

## 2024-09-14 NOTE — Discharge Instructions (Signed)
 Please call and schedule an appointment with your orthopedist.    You may also consider calling the orthopedic urgent care to see if they are able to get an MRI scheduled.  If you take the pain medication, be aware that it may make you dizzy or sleepy.  You should not drive or operate any machinery for at least 8 hours after taking the last dose.  I do recommend taking the Naprosyn  twice per day as prescribed.  Wear the sling most of the time however remove it every few hours and perform gentle range of motion exercises of your elbow and shoulder.   Apply ice off-and-on 20 minutes/h while awake.

## 2024-09-15 ENCOUNTER — Ambulatory Visit
Admission: RE | Admit: 2024-09-15 | Discharge: 2024-09-15 | Disposition: A | Discharge status: Home / Self Care | Source: Ambulatory Visit | Attending: Internal Medicine | Admitting: Internal Medicine

## 2024-09-15 DIAGNOSIS — R9431 Abnormal electrocardiogram [ECG] [EKG]: Secondary | ICD-10-CM | POA: Diagnosis present

## 2024-09-15 DIAGNOSIS — I10 Essential (primary) hypertension: Secondary | ICD-10-CM | POA: Insufficient documentation

## 2024-09-15 DIAGNOSIS — G4733 Obstructive sleep apnea (adult) (pediatric): Secondary | ICD-10-CM | POA: Diagnosis present

## 2024-09-15 DIAGNOSIS — I471 Supraventricular tachycardia, unspecified: Secondary | ICD-10-CM | POA: Insufficient documentation

## 2024-09-15 DIAGNOSIS — E782 Mixed hyperlipidemia: Secondary | ICD-10-CM | POA: Insufficient documentation

## 2024-09-15 MED ORDER — NITROGLYCERIN 0.4 MG SL SUBL
0.8000 mg | SUBLINGUAL_TABLET | Freq: Once | SUBLINGUAL | Status: AC
  Administered 2024-09-15: 0.8 mg via SUBLINGUAL
  Filled 2024-09-15: qty 25

## 2024-09-15 MED ORDER — IOHEXOL 350 MG/ML SOLN
100.0000 mL | Freq: Once | INTRAVENOUS | Status: AC | PRN
  Administered 2024-09-15: 100 mL via INTRAVENOUS

## 2024-09-15 MED ORDER — DILTIAZEM HCL 25 MG/5ML IV SOLN
10.0000 mg | INTRAVENOUS | Status: DC | PRN
  Filled 2024-09-15: qty 5

## 2024-09-15 NOTE — Progress Notes (Signed)
 Patient tolerated CT well. Vitals stable. Patient encouraged to drink fluids throughout day.

## 2024-09-22 NOTE — ED Provider Notes (Signed)
 [facility] Provider Note    Event Date/Time   First MD Initiated Contact with Patient 09/14/24 1028     (approximate)   History   Fall   HPI  Jesus Mckinney is a 68 y.o. male  with history of vertigo, COPD, HTN, and as listed in EMR presents to the emergency department for evaluation of left shoulder pain after mechanical, nonsyncopal fall last night while doing yard work.  Patient states he tripped over a rock and landed backward on his left shoulder and left side ribs.  No head injury or loss of consciousness.     Physical Exam    Vitals:   09/14/24 0928  BP: 131/61  Pulse: (!) 53  Resp: 18  Temp: 98.1 F (36.7 C)  SpO2: 98%    General: Awake, no distress.  CV:  Good peripheral perfusion.  Resp:  Normal effort.  Abd:  No distention.  Other:  Limited range of motion of the left shoulder.  No obvious deformity or step-off.  Radial pulses 2+   ED Results / Procedures / Treatments   Labs (all labs ordered are listed, but only abnormal results are displayed)  Labs Reviewed - No data to display   EKG  Not indicated   RADIOLOGY  Image and radiology report reviewed and interpreted by me. Radiology report consistent with the same.  CT head and cervical spine are both negative for acute concerns.  X-ray image of the left shoulder shows some degenerative changes but no acute concerns.  Image of the chest and left ribs negative as well.  PROCEDURES:  Critical Care performed: No  Procedures   MEDICATIONS ORDERED IN ED:  Medications  oxyCODONE  (Oxy IR/ROXICODONE ) immediate release tablet 5 mg (5 mg Oral Given 09/14/24 1133)     IMPRESSION / MDM / ASSESSMENT AND PLAN / ED COURSE   I have reviewed the triage note and vital signs. Vital signs--bradycardic at 53, asymptomatic   Differential diagnosis includes, but is not limited to, rotator cuff injury, proximal humerus fracture, shoulder dislocation  Patient's  presentation is most consistent with acute illness / injury with system symptoms.  68 year old male presenting to the emergency department after mechanical, nonsyncopal fall last night.  See HPI for further details.  On exam, he has limited abduction, internal or external rotation.  Imaging is negative for acute concerns.  Patient requesting MRI however advised that the emergency do department does not perform nonemergent studies and he will need to see his orthopedist or potentially go to the orthopedic urgent care.  Pain medication submitted to patient's pharmacy.  Patient was placed in a sling and discharged in stable condition.      FINAL CLINICAL IMPRESSION(S) / ED DIAGNOSES   Final diagnoses:  Shoulder strain, left, initial encounter  Rib contusion, left, initial encounter  Minor head injury, initial encounter     Rx / DC Orders   ED Discharge Orders          Ordered    naproxen  (NAPROSYN ) 500 MG tablet  2 times daily with meals        09/14/24 1133    oxyCODONE  (ROXICODONE ) 5 MG immediate release tablet  Every 8 hours PRN        09/14/24 1133             Note:  This document was prepared using Dragon voice recognition software and may include unintentional dictation errors.   Herlinda Kirk KATHEE, FNP 09/22/24 [phone]  Suzanne Kirsch, MD 09/24/24 [phone]

## 2024-11-10 ENCOUNTER — Emergency Department

## 2024-11-10 ENCOUNTER — Other Ambulatory Visit: Payer: Self-pay

## 2024-11-10 ENCOUNTER — Emergency Department
Admission: EM | Admit: 2024-11-10 | Discharge: 2024-11-10 | Disposition: A | Discharge status: Home / Self Care | Attending: Emergency Medicine | Admitting: Emergency Medicine

## 2024-11-10 DIAGNOSIS — R110 Nausea: Secondary | ICD-10-CM | POA: Diagnosis not present

## 2024-11-10 DIAGNOSIS — K5900 Constipation, unspecified: Secondary | ICD-10-CM | POA: Diagnosis present

## 2024-11-10 DIAGNOSIS — I10 Essential (primary) hypertension: Secondary | ICD-10-CM | POA: Diagnosis not present

## 2024-11-10 DIAGNOSIS — J449 Chronic obstructive pulmonary disease, unspecified: Secondary | ICD-10-CM | POA: Insufficient documentation

## 2024-11-10 LAB — CBC
HCT: 41.3 % (ref 39.0–52.0)
Hemoglobin: 13.7 g/dL (ref 13.0–17.0)
MCH: 33.5 pg (ref 26.0–34.0)
MCHC: 33.2 g/dL (ref 30.0–36.0)
MCV: 101 fL — ABNORMAL HIGH (ref 80.0–100.0)
Platelets: 201 K/uL (ref 150–400)
RBC: 4.09 MIL/uL — ABNORMAL LOW (ref 4.22–5.81)
RDW: 13 % (ref 11.5–15.5)
WBC: 5.9 K/uL (ref 4.0–10.5)
nRBC: 0 % (ref 0.0–0.2)

## 2024-11-10 LAB — COMPREHENSIVE METABOLIC PANEL WITH GFR
ALT: 18 U/L (ref 0–44)
AST: 19 U/L (ref 15–41)
Albumin: 4.1 g/dL (ref 3.5–5.0)
Alkaline Phosphatase: 78 U/L (ref 38–126)
Anion gap: 12 (ref 5–15)
BUN: 11 mg/dL (ref 8–23)
CO2: 15 mmol/L — ABNORMAL LOW (ref 22–32)
Calcium: 8.7 mg/dL — ABNORMAL LOW (ref 8.9–10.3)
Chloride: 109 mmol/L (ref 98–111)
Creatinine, Ser: 1.03 mg/dL (ref 0.61–1.24)
GFR, Estimated: >60 mL/min
Glucose, Bld: 137 mg/dL — ABNORMAL HIGH (ref 70–99)
Potassium: 4 mmol/L (ref 3.5–5.1)
Sodium: 136 mmol/L (ref 135–145)
Total Bilirubin: 0.4 mg/dL (ref 0.0–1.2)
Total Protein: 6.7 g/dL (ref 6.5–8.1)

## 2024-11-10 MED ORDER — IOHEXOL 300 MG/ML  SOLN
100.0000 mL | Freq: Once | INTRAMUSCULAR | Status: AC | PRN
  Administered 2024-11-10: 100 mL via INTRAVENOUS

## 2024-11-10 NOTE — ED Provider Notes (Signed)
 "  [facility] Provider Note    Event Date/Time   First MD Initiated Contact with Patient 11/10/24 1323     (approximate)   History   Constipation   HPI  Jesus Mckinney is a 69 y.o. male with history of COPD, sepsis secondary to pneumonia, GERD, anxiety, hypertension and as listed in EMR presents to the emergency department for treatment and evaluation of constipation.  No bowel movement in the last 8 days.  No relief with over-the-counter medications. Nine days ago, he had a colon cleanse while in the [place].  He states that it cleaned him out and he had significant relief but since that time has not had another bowel movement.  He denies abdominal pain.  He states that he feels full.  He is able to pass gas when he takes a laxative but has no actual stool.  He does not have the urge to defecate.  He has had 1 day of nausea but no vomiting. No fever. Only abdominal surgery has been umbilical hernia repair and mesh was used.   Physical Exam    Vitals:   11/10/24 1039 11/10/24 1504  BP: 129/71   Pulse:    Resp:    Temp:  98.1 F (36.7 C)  SpO2:      General: Awake, no distress.  CV:  Good peripheral perfusion.  Resp:  Normal effort.  Abd:  No distention. Soft. Nontender. Other:  Not indicated.   ED Results / Procedures / Treatments   Labs (all labs ordered are listed, but only abnormal results are displayed)  Labs Reviewed  CBC - Abnormal; Notable for the following components:      Result Value   RBC 4.09 (*)    MCV 101.0 (*)    All other components within normal limits  COMPREHENSIVE METABOLIC PANEL WITH GFR - Abnormal; Notable for the following components:   CO2 15 (*)    Glucose, Bld 137 (*)    Calcium  8.7 (*)    All other components within normal limits     EKG  Not indicated.   RADIOLOGY  Image and radiology report reviewed and interpreted by me. Radiology report consistent with the  same.  Pending  PROCEDURES:  Critical Care performed: No  Procedures   MEDICATIONS ORDERED IN ED:  Medications - No data to display   IMPRESSION / MDM / ASSESSMENT AND PLAN / ED COURSE   I have reviewed the triage note and vital signs. Vital signs are stable.   Differential diagnosis includes, but is not limited to, constipation, ileus, partial bowel obstruction, bowel obstruction  Patient's presentation is most consistent with acute complicated illness / injury requiring diagnostic workup.  69 year old male presenting to the emergency department for treatment and evaluation of constipation.  He has not had a bowel movement in the last 8 days despite taking multiple over-the-counter medications.  See HPI for further details.  While awaiting ER room assignment, labs were obtained.  CBC is without acute concerns.  CMP is relatively normal.  CO2 is 15,  random glucose is 137, calcium  of 8.7.  X-ray image of the abdomen also obtained which shows a nonobstructive bowel gas pattern with moderate fecal retention over the right colon.  Plan will be to get a CT of the abdomen and pelvis to ensure there is no ileus, partial or complete obstruction.  Patient agreeable to this plan.  FINAL CLINICAL IMPRESSION(S) / ED DIAGNOSES   Final diagnoses:  Constipation, unspecified  constipation type     Rx / DC Orders   ED Discharge Orders     None        Note:  This document was prepared using Dragon voice recognition software and may include unintentional dictation errors.   Herlinda Kirk NOVAK, FNP 11/10/24 1536  "

## 2024-11-10 NOTE — ED Provider Notes (Signed)
" °  Physical Exam  BP 129/71   Pulse (!) 59   Temp 98.1 F (36.7 C) (Oral)   Resp 16   Ht 5' 5 (1.651 m)   Wt 86.2 kg   SpO2 97%   BMI 31.62 kg/m   Physical Exam  Procedures  Procedures  ED Course / MDM   Clinical Course as of 11/10/24 1657  Thu Nov 10, 2024  1535 Care transferred to Devere Perry, PA-C who will follow-up on CT abdomen pelvis results with likely plan for discharge. [CT]    Clinical Course User Index [CT] Herlinda Kirk NOVAK, FNP   Medical Decision Making Amount and/or Complexity of Data Reviewed Radiology: ordered.  Risk Prescription drug management.   Assuming care of patient from Elenor Landry Herlinda, FNP.  At this time plan is to evaluate CT for bowel obstruction.  See original H&P  CT abdomen pelvis IV contrast independent review interpretation by me as being negative for acute abnormality, radiologist does comment on moderate stool burden but no bowel obstruction  I did explain the findings to the patient.  Gave him a care plan for constipation with 2-4 Dulcolax, 1 cup of MiraLAX every 2 hours until he has bowel movement.  The patient is in agreement with this treatment plan.  Is discharged stable condition.     Perry Devere ORN, PA-C 11/10/24 1657    Willo Dunnings, MD 11/10/24 [phone]  "

## 2024-11-10 NOTE — Discharge Instructions (Signed)
 Take 2-4 Dulcolax, you can also take mag citrate, and then take a cup of MiraLAX every 2 hours until you have a bowel movement.

## 2024-11-10 NOTE — ED Triage Notes (Addendum)
 Pt reports no BM x8 days. Has been taking OTC meds. States was in [place] and had a colon cleanse recently.

## 2024-11-10 NOTE — ED Provider Triage Note (Addendum)
 Emergency Medicine Provider Triage Evaluation Note  Jesus Mckinney , a 69 y.o. male  was evaluated in triage.  Pt complains of constipation, last BM was 8 days ago. Patient has tried stool softeners, dulcolax, miralax, suppository, enema. Patient did travel to the [place], Dec 10th and returned about a week ago. He was constipated while he was there and had an enema that cleaned him out.  Review of Systems  Positive: Constipation, bloating,  Negative: Vomiting, nausea, fevers,diarrhea.  Physical Exam  Pulse (!) 59   Temp 98 F (36.7 C)   Resp 16   Ht 5' 5 (1.651 m)   Wt 86.2 kg   SpO2 97%   BMI 31.62 kg/m  Gen:   Awake, no distress   Resp:  Normal effort  MSK:   Moves extremities without difficulty  Other:    Medical Decision Making  Medically screening exam initiated at 10:37 AM.  Appropriate orders placed.  Jesus Mckinney was informed that the remainder of the evaluation will be completed by another provider, this initial triage assessment does not replace that evaluation, and the importance of remaining in the ED until their evaluation is complete.     Jesus Jesus LABOR, PA-C 11/10/24 1041    Jesus Jesus LABOR, PA-C 11/10/24 1051

## 2024-11-23 ENCOUNTER — Ambulatory Visit
Admission: RE | Admit: 2024-11-23 | Discharge: 2024-11-23 | Disposition: A | Discharge status: Home / Self Care | Attending: Internal Medicine | Admitting: Internal Medicine

## 2024-11-23 ENCOUNTER — Encounter
Admission: RE | Disposition: A | Discharge status: Home / Self Care | Payer: Self-pay | Source: Home / Self Care | Attending: Internal Medicine

## 2024-11-23 ENCOUNTER — Other Ambulatory Visit: Payer: Self-pay

## 2024-11-23 ENCOUNTER — Encounter: Payer: Self-pay | Admitting: Internal Medicine

## 2024-11-23 DIAGNOSIS — Z79899 Other long term (current) drug therapy: Secondary | ICD-10-CM | POA: Diagnosis not present

## 2024-11-23 DIAGNOSIS — I2510 Atherosclerotic heart disease of native coronary artery without angina pectoris: Secondary | ICD-10-CM | POA: Insufficient documentation

## 2024-11-23 DIAGNOSIS — E782 Mixed hyperlipidemia: Secondary | ICD-10-CM | POA: Insufficient documentation

## 2024-11-23 DIAGNOSIS — R9430 Abnormal result of cardiovascular function study, unspecified: Secondary | ICD-10-CM | POA: Diagnosis present

## 2024-11-23 DIAGNOSIS — I471 Supraventricular tachycardia, unspecified: Secondary | ICD-10-CM | POA: Diagnosis not present

## 2024-11-23 DIAGNOSIS — Z7902 Long term (current) use of antithrombotics/antiplatelets: Secondary | ICD-10-CM | POA: Insufficient documentation

## 2024-11-23 DIAGNOSIS — Z7982 Long term (current) use of aspirin: Secondary | ICD-10-CM | POA: Diagnosis not present

## 2024-11-23 DIAGNOSIS — Z87891 Personal history of nicotine dependence: Secondary | ICD-10-CM | POA: Diagnosis not present

## 2024-11-23 DIAGNOSIS — R931 Abnormal findings on diagnostic imaging of heart and coronary circulation: Secondary | ICD-10-CM

## 2024-11-23 DIAGNOSIS — I2584 Coronary atherosclerosis due to calcified coronary lesion: Secondary | ICD-10-CM | POA: Diagnosis not present

## 2024-11-23 DIAGNOSIS — I10 Essential (primary) hypertension: Secondary | ICD-10-CM | POA: Diagnosis not present

## 2024-11-23 LAB — GLUCOSE, CAPILLARY: Glucose-Capillary: 96 mg/dL (ref 70–99)

## 2024-11-23 LAB — CARDIAC CATHETERIZATION: Cath EF Quantitative: 55 %

## 2024-11-23 MED ORDER — FENTANYL CITRATE (PF) 100 MCG/2ML IJ SOLN
INTRAMUSCULAR | Status: AC
  Filled 2024-11-23: qty 2

## 2024-11-23 MED ORDER — SODIUM CHLORIDE 0.9% FLUSH: 3.0000 mL | INTRAVENOUS | Status: DC | PRN

## 2024-11-23 MED ORDER — FENTANYL CITRATE (PF) 100 MCG/2ML IJ SOLN
INTRAMUSCULAR | Status: DC | PRN
  Administered 2024-11-23: 25 ug via INTRAVENOUS

## 2024-11-23 MED ORDER — FREE WATER: 500.0000 mL | Freq: Once | Status: DC

## 2024-11-23 MED ORDER — VERAPAMIL HCL 2.5 MG/ML IV SOLN
INTRAVENOUS | Status: DC | PRN
  Administered 2024-11-23: 2.5 mg via INTRAVENOUS

## 2024-11-23 MED ORDER — VERAPAMIL HCL 2.5 MG/ML IV SOLN
INTRAVENOUS | Status: AC
  Filled 2024-11-23: qty 2

## 2024-11-23 MED ORDER — MIDAZOLAM HCL 2 MG/2ML IJ SOLN
INTRAMUSCULAR | Status: AC
  Filled 2024-11-23: qty 2

## 2024-11-23 MED ORDER — SODIUM CHLORIDE 0.9 % IV SOLN
250.0000 mL | INTRAVENOUS | Status: DC | PRN
  Administered 2024-11-23: 250 mL via INTRAVENOUS

## 2024-11-23 MED ORDER — HEPARIN SODIUM (PORCINE) 1000 UNIT/ML IJ SOLN
INTRAMUSCULAR | Status: AC
  Filled 2024-11-23: qty 10

## 2024-11-23 MED ORDER — IOHEXOL 300 MG/ML  SOLN
INTRAMUSCULAR | Status: DC | PRN
  Administered 2024-11-23: 100 mL

## 2024-11-23 MED ORDER — LIDOCAINE HCL (PF) 1 % IJ SOLN
INTRAMUSCULAR | Status: DC | PRN
  Administered 2024-11-23: 5 mL

## 2024-11-23 MED ORDER — SODIUM CHLORIDE 0.9% FLUSH: 3.0000 mL | Freq: Two times a day (BID) | INTRAVENOUS | Status: DC

## 2024-11-23 MED ORDER — ASPIRIN 81 MG PO CHEW: 81.0000 mg | CHEWABLE_TABLET | Freq: Every day | ORAL | Status: DC

## 2024-11-23 MED ORDER — ASPIRIN 81 MG PO CHEW
CHEWABLE_TABLET | ORAL | Status: AC
  Filled 2024-11-23: qty 1

## 2024-11-23 MED ORDER — HEPARIN SODIUM (PORCINE) 1000 UNIT/ML IJ SOLN
INTRAMUSCULAR | Status: DC | PRN
  Administered 2024-11-23: 4000 [IU] via INTRAVENOUS

## 2024-11-23 MED ORDER — ASPIRIN 81 MG PO CHEW
81.0000 mg | CHEWABLE_TABLET | ORAL | Status: AC
  Administered 2024-11-23: 81 mg via ORAL

## 2024-11-23 MED ORDER — SODIUM CHLORIDE 0.9 % IV SOLN: 250.0000 mL | INTRAVENOUS | Status: DC | PRN

## 2024-11-23 MED ORDER — HEPARIN (PORCINE) IN NACL 1000-0.9 UT/500ML-% IV SOLN
INTRAVENOUS | Status: AC
  Filled 2024-11-23: qty 1000

## 2024-11-23 MED ORDER — MIDAZOLAM HCL (PF) 2 MG/2ML IJ SOLN
INTRAMUSCULAR | Status: DC | PRN
  Administered 2024-11-23: 1 mg via INTRAVENOUS

## 2024-11-23 MED ORDER — LIDOCAINE HCL 1 % IJ SOLN
INTRAMUSCULAR | Status: AC
  Filled 2024-11-23: qty 20

## 2024-11-23 MED ORDER — HEPARIN (PORCINE) IN NACL 2000-0.9 UNIT/L-% IV SOLN
INTRAVENOUS | Status: DC | PRN
  Administered 2024-11-23: 1000 mL
